# Patient Record
Sex: Female | Born: 1940 | Race: White | Hispanic: No | Marital: Single | Smoking: Former smoker
Health system: Southern US, Community
[De-identification: ages and names within clinical notes are randomized; demographics above are authoritative.]

## PROBLEM LIST (undated history)

## (undated) DIAGNOSIS — G20 Parkinson's disease: Secondary | ICD-10-CM

## (undated) DIAGNOSIS — M1990 Unspecified osteoarthritis, unspecified site: Secondary | ICD-10-CM

## (undated) DIAGNOSIS — G709 Myoneural disorder, unspecified: Secondary | ICD-10-CM

## (undated) DIAGNOSIS — M810 Age-related osteoporosis without current pathological fracture: Secondary | ICD-10-CM

## (undated) DIAGNOSIS — G20A1 Parkinson's disease without dyskinesia, without mention of fluctuations: Secondary | ICD-10-CM

## (undated) DIAGNOSIS — Z9289 Personal history of other medical treatment: Secondary | ICD-10-CM

## (undated) DIAGNOSIS — I10 Essential (primary) hypertension: Secondary | ICD-10-CM

## (undated) DIAGNOSIS — R519 Headache, unspecified: Secondary | ICD-10-CM

## (undated) DIAGNOSIS — Z973 Presence of spectacles and contact lenses: Secondary | ICD-10-CM

## (undated) DIAGNOSIS — R600 Localized edema: Secondary | ICD-10-CM

## (undated) HISTORY — PX: EYE SURGERY: SHX253

## (undated) HISTORY — PX: MOUTH SURGERY: SHX715

## (undated) HISTORY — DX: Myoneural disorder, unspecified: G70.9

---

## 1990-07-25 HISTORY — PX: FOOT TUMOR EXCISION: SUR566

## 1992-07-25 HISTORY — PX: BREAST CYST EXCISION: SHX579

## 1998-04-01 ENCOUNTER — Other Ambulatory Visit: Admission: RE | Admit: 1998-04-01 | Discharge: 1998-04-01 | Payer: Self-pay | Admitting: Obstetrics and Gynecology

## 1998-10-28 ENCOUNTER — Ambulatory Visit (HOSPITAL_COMMUNITY): Admission: RE | Admit: 1998-10-28 | Discharge: 1998-10-28 | Payer: Self-pay | Admitting: Gastroenterology

## 1999-08-03 ENCOUNTER — Ambulatory Visit (HOSPITAL_COMMUNITY): Admission: RE | Admit: 1999-08-03 | Discharge: 1999-08-03 | Payer: Self-pay | Admitting: Obstetrics and Gynecology

## 1999-08-03 ENCOUNTER — Encounter: Payer: Self-pay | Admitting: Obstetrics and Gynecology

## 1999-08-27 ENCOUNTER — Ambulatory Visit (HOSPITAL_COMMUNITY): Admission: RE | Admit: 1999-08-27 | Discharge: 1999-08-27 | Payer: Self-pay | Admitting: Obstetrics and Gynecology

## 1999-08-27 ENCOUNTER — Encounter: Payer: Self-pay | Admitting: Obstetrics and Gynecology

## 2000-02-22 ENCOUNTER — Encounter: Admission: RE | Admit: 2000-02-22 | Discharge: 2000-02-22 | Payer: Self-pay | Admitting: Obstetrics and Gynecology

## 2000-02-22 ENCOUNTER — Encounter: Payer: Self-pay | Admitting: Obstetrics and Gynecology

## 2000-08-23 ENCOUNTER — Encounter: Payer: Self-pay | Admitting: Obstetrics and Gynecology

## 2000-08-23 ENCOUNTER — Encounter: Admission: RE | Admit: 2000-08-23 | Discharge: 2000-08-23 | Payer: Self-pay | Admitting: Obstetrics and Gynecology

## 2000-09-07 ENCOUNTER — Encounter: Payer: Self-pay | Admitting: Internal Medicine

## 2000-09-07 ENCOUNTER — Encounter: Admission: RE | Admit: 2000-09-07 | Discharge: 2000-09-07 | Payer: Self-pay | Admitting: Internal Medicine

## 2001-01-22 ENCOUNTER — Encounter (INDEPENDENT_AMBULATORY_CARE_PROVIDER_SITE_OTHER): Payer: Self-pay | Admitting: *Deleted

## 2001-01-22 ENCOUNTER — Ambulatory Visit (HOSPITAL_BASED_OUTPATIENT_CLINIC_OR_DEPARTMENT_OTHER): Admission: RE | Admit: 2001-01-22 | Discharge: 2001-01-22 | Payer: Self-pay | Admitting: *Deleted

## 2001-08-31 ENCOUNTER — Encounter: Admission: RE | Admit: 2001-08-31 | Discharge: 2001-08-31 | Payer: Self-pay | Admitting: Obstetrics and Gynecology

## 2001-08-31 ENCOUNTER — Encounter: Payer: Self-pay | Admitting: Obstetrics and Gynecology

## 2001-12-27 ENCOUNTER — Encounter (INDEPENDENT_AMBULATORY_CARE_PROVIDER_SITE_OTHER): Payer: Self-pay | Admitting: Specialist

## 2001-12-27 ENCOUNTER — Ambulatory Visit (HOSPITAL_COMMUNITY): Admission: RE | Admit: 2001-12-27 | Discharge: 2001-12-27 | Payer: Self-pay | Admitting: Gastroenterology

## 2002-09-24 ENCOUNTER — Encounter: Admission: RE | Admit: 2002-09-24 | Discharge: 2002-09-24 | Payer: Self-pay | Admitting: Internal Medicine

## 2002-09-24 ENCOUNTER — Encounter: Payer: Self-pay | Admitting: Internal Medicine

## 2002-09-30 ENCOUNTER — Encounter: Admission: RE | Admit: 2002-09-30 | Discharge: 2002-09-30 | Payer: Self-pay | Admitting: Internal Medicine

## 2002-09-30 ENCOUNTER — Encounter: Payer: Self-pay | Admitting: Internal Medicine

## 2003-01-29 ENCOUNTER — Other Ambulatory Visit: Admission: RE | Admit: 2003-01-29 | Discharge: 2003-01-29 | Payer: Self-pay | Admitting: Internal Medicine

## 2003-02-17 ENCOUNTER — Encounter: Payer: Self-pay | Admitting: Internal Medicine

## 2003-02-17 ENCOUNTER — Encounter: Admission: RE | Admit: 2003-02-17 | Discharge: 2003-02-17 | Payer: Self-pay | Admitting: Internal Medicine

## 2003-03-28 ENCOUNTER — Encounter: Payer: Self-pay | Admitting: Internal Medicine

## 2003-03-28 ENCOUNTER — Encounter: Admission: RE | Admit: 2003-03-28 | Discharge: 2003-03-28 | Payer: Self-pay | Admitting: Internal Medicine

## 2003-10-10 ENCOUNTER — Encounter: Admission: RE | Admit: 2003-10-10 | Discharge: 2003-10-10 | Payer: Self-pay | Admitting: Internal Medicine

## 2004-02-02 ENCOUNTER — Other Ambulatory Visit: Admission: RE | Admit: 2004-02-02 | Discharge: 2004-02-02 | Payer: Self-pay | Admitting: Internal Medicine

## 2004-12-14 ENCOUNTER — Encounter: Admission: RE | Admit: 2004-12-14 | Discharge: 2004-12-14 | Payer: Self-pay | Admitting: Internal Medicine

## 2005-02-03 ENCOUNTER — Other Ambulatory Visit: Admission: RE | Admit: 2005-02-03 | Discharge: 2005-02-03 | Payer: Self-pay | Admitting: Internal Medicine

## 2005-03-07 ENCOUNTER — Encounter (INDEPENDENT_AMBULATORY_CARE_PROVIDER_SITE_OTHER): Payer: Self-pay | Admitting: Specialist

## 2005-03-07 ENCOUNTER — Ambulatory Visit (HOSPITAL_COMMUNITY): Admission: RE | Admit: 2005-03-07 | Discharge: 2005-03-07 | Payer: Self-pay | Admitting: Gastroenterology

## 2005-03-29 ENCOUNTER — Encounter: Admission: RE | Admit: 2005-03-29 | Discharge: 2005-03-29 | Payer: Self-pay | Admitting: Internal Medicine

## 2005-07-15 ENCOUNTER — Ambulatory Visit: Admission: RE | Admit: 2005-07-15 | Discharge: 2005-07-15 | Payer: Self-pay | Admitting: Internal Medicine

## 2005-07-25 HISTORY — PX: OTHER SURGICAL HISTORY: SHX169

## 2005-12-30 ENCOUNTER — Encounter: Admission: RE | Admit: 2005-12-30 | Discharge: 2005-12-30 | Payer: Self-pay | Admitting: Internal Medicine

## 2006-02-24 ENCOUNTER — Other Ambulatory Visit: Admission: RE | Admit: 2006-02-24 | Discharge: 2006-02-24 | Payer: Self-pay | Admitting: Internal Medicine

## 2007-01-01 ENCOUNTER — Encounter: Admission: RE | Admit: 2007-01-01 | Discharge: 2007-01-01 | Payer: Self-pay | Admitting: *Deleted

## 2008-01-04 ENCOUNTER — Encounter: Admission: RE | Admit: 2008-01-04 | Discharge: 2008-01-04 | Payer: Self-pay | Admitting: Internal Medicine

## 2009-01-08 ENCOUNTER — Encounter: Admission: RE | Admit: 2009-01-08 | Discharge: 2009-01-08 | Payer: Self-pay | Admitting: Internal Medicine

## 2010-01-11 ENCOUNTER — Encounter: Admission: RE | Admit: 2010-01-11 | Discharge: 2010-01-11 | Payer: Self-pay | Admitting: Internal Medicine

## 2010-12-06 ENCOUNTER — Other Ambulatory Visit: Payer: Self-pay | Admitting: Internal Medicine

## 2010-12-06 DIAGNOSIS — Z1231 Encounter for screening mammogram for malignant neoplasm of breast: Secondary | ICD-10-CM

## 2010-12-10 NOTE — Op Note (Signed)
[place]  Patient:    Hailey [name], Hailey [name]                        MRN: [id number] Proc. Date: 01/22/01 Attending:  Vikki Ports, M.D.                           Operative Report  PREOPERATIVE DIAGNOSIS:  Left breast mass.  POSTOPERATIVE DIAGNOSIS:  Left breast mass.  PROCEDURE:  Excisional left breast biopsy.  SURGEON:  Vikki Ports, M.D.  ANESTHESIA:  Local MAC.  DESCRIPTION OF PROCEDURE:  The patient was taken to the operating room and placed in a supine position.  After adequate anesthesia was induced using MAC technique, the left breast was prepped and draped in the normal sterile fashion.  Using 1% lidocaine local anesthesia, the skin overlying the mass in the 2 oclock region of the left breast was anesthetized.  Dissected down onto a soft, fatty, dominant mass, which was excised in its entirety.  Adequate hemostasis was ensured, and the skin was closed with subcuticular 4-0 Monocryl and Steri-Strips.  Sterile dressing was applied.  The patient tolerated the procedure well and went to PACU in good condition. DD:  01/22/01 TD:  01/22/01 Job: [id number]

## 2010-12-10 NOTE — Op Note (Signed)
[place]  Patient:    Hailey [name], Hailey [name] Visit Number: [id number] MRN: [id number]          Service Type: END Location: ENDO Attending Physician:  Orland Mustard Dictated by:   Llana Aliment. Randa Evens, M.D. Proc. Date: 12/27/01 Admit Date:  12/27/2001   CC:         Darius Bump, M.D.   Operative Report  PROCEDURE:  Colonoscopy and polypectomy.  MEDICATION:  Versed 20 mcg, ______ 10 mg IV.  SCOPE:  Olympus pediatric video colonoscope.  INDICATION:  The patient has had a previous history of adenomatous polyps. This is done as a three year followup.  DESCRIPTION OF PROCEDURE:  The procedure has been explained and the patient consent obtained. The patient in the left lateral decubitus position, the pediatric nonadjustable Olympus scope was inserted and advanced under direct visualization. The patient had long tortuous colon and multiple maneuvers were required. In the lateral decubitus position we were able to reach the cecum. The ileocecal valve and appendiceal orifice were seen. The scope was withdrawn. The cecum, ascending colon, hepatic flexure, transverse colon, splenic flexure, descending and sigmoid colon were seen. In the sigmoid colon, the patient was seen to have moderate diverticulosis. There was a polyp at 35 cm. It was removed. It was 0.5 cm in diameter and on a short stalk and recovered. No other polyps were seen in the sigmoid colon or rectum. The scope was withdrawn, and the patient tolerated the procedure well.  ASSESSMENT:  Sigmoid colon polyp removed. Clinically, recommend repeating procedure in three years, routine postpolypectomy instructions. Dictated by:   Llana Aliment. Randa Evens, M.D. Attending Physician:  Orland Mustard DD:  12/27/01 TD:  12/29/01 Job: [id number]

## 2010-12-10 NOTE — Op Note (Signed)
Hailey Hailey [name], Hailey [name]               ACCOUNT NO.:  [id number]   MEDICAL RECORD NO.:  [id number]          PATIENT TYPE:  AMB   LOCATION:  ENDO                         FACILITY:  [facility]   PHYSICIAN:  James L. Malon Kindle., M.D.DATE OF BIRTH:  11-26-1940   DATE OF PROCEDURE:  03/07/2005  DATE OF DISCHARGE:                                 OPERATIVE REPORT   PROCEDURE:  Colonoscopy and polypectomy.   MEDICATIONS:  Fentanyl 112 mcg, Versed 11mg  IV.   SCOPE:  Olympus pediatric adjustable colonoscope.   INDICATIONS:  Patient has had a previous history of adenomatous polyps.  Mother has had colon cancer.  This is done as a follow-up procedure.   DESCRIPTION OF PROCEDURE:  The procedure has been explained to the patient  and consent obtained.  With the patient in the left lateral decubitus  position, the Olympus pediatric adjustable scope was inserted and advanced.  The prep was excellent.  Using abdominal pressure and position changes, we  were able to finally reach the cecum with the patient in the supine  position.  She had a long, tortuous colon.  Ileocecal valve and appendiceal  orifice seen.  The scope was withdrawn.  The colon carefully examined upon  withdrawal.  No polyps were found until the sigmoid colon 25 cm from the  anal verge, a 0.5 cm sessile polyp was removed with the snare.  I could not  initially recover it and finally ended up grabbing it with the biopsy  forceps and pulling it through the scope.  There was no bleeding at the  site.  No other polyps found throughout the entire colon or the rectum.  Scattered diverticulosis throughout the colon, primarily in the transverse  and more so in the left colon.  The rectum was free of polyps or other  lesions.  The scope was withdrawn.  The patient tolerated the procedure  well.   ASSESSMENT:  1.  Sigmoid colon polyp, removed.  211.3.  2.  Diverticulosis.  562.10.  3.  Strong family history of colon cancer.  V16.0.   PLAN:   Routine post polypectomy instructions.  Will recommend repeating  procedure in five years.           ______________________________  Llana Aliment Malon Kindle., M.D.     Waldron Session  D:  03/07/2005  T:  03/07/2005  Job:  [id number]   cc:   Darius Bump, M.D.  [place] [address] [place]  [place] [address]  Fax: [phone]

## 2011-01-13 ENCOUNTER — Ambulatory Visit
Admission: RE | Admit: 2011-01-13 | Discharge: 2011-01-13 | Disposition: A | Discharge status: Home / Self Care | Payer: Medicare Other | Source: Ambulatory Visit | Attending: Internal Medicine | Admitting: Internal Medicine

## 2011-01-13 DIAGNOSIS — Z1231 Encounter for screening mammogram for malignant neoplasm of breast: Secondary | ICD-10-CM

## 2011-12-27 ENCOUNTER — Other Ambulatory Visit: Payer: Self-pay | Admitting: Internal Medicine

## 2011-12-27 DIAGNOSIS — Z1231 Encounter for screening mammogram for malignant neoplasm of breast: Secondary | ICD-10-CM

## 2012-01-05 ENCOUNTER — Other Ambulatory Visit: Payer: Self-pay | Admitting: Dermatology

## 2012-01-17 ENCOUNTER — Ambulatory Visit
Admission: RE | Admit: 2012-01-17 | Discharge: 2012-01-17 | Disposition: A | Discharge status: Home / Self Care | Payer: Medicare Other | Source: Ambulatory Visit | Attending: Internal Medicine | Admitting: Internal Medicine

## 2012-01-17 DIAGNOSIS — Z1231 Encounter for screening mammogram for malignant neoplasm of breast: Secondary | ICD-10-CM

## 2012-01-19 ENCOUNTER — Ambulatory Visit: Admission: RE | Admit: 2012-01-19 | Payer: Medicare Other | Source: Ambulatory Visit

## 2012-01-19 ENCOUNTER — Ambulatory Visit
Admission: RE | Admit: 2012-01-19 | Discharge: 2012-01-19 | Disposition: A | Discharge status: Home / Self Care | Payer: Medicare Other | Source: Ambulatory Visit | Attending: Internal Medicine | Admitting: Internal Medicine

## 2012-01-19 ENCOUNTER — Other Ambulatory Visit: Payer: Self-pay | Admitting: Internal Medicine

## 2012-01-19 DIAGNOSIS — Z1231 Encounter for screening mammogram for malignant neoplasm of breast: Secondary | ICD-10-CM

## 2012-10-23 ENCOUNTER — Other Ambulatory Visit (HOSPITAL_COMMUNITY): Payer: Self-pay | Admitting: *Deleted

## 2012-10-24 ENCOUNTER — Ambulatory Visit (HOSPITAL_COMMUNITY)
Admission: RE | Admit: 2012-10-24 | Discharge: 2012-10-24 | Disposition: A | Discharge status: Home / Self Care | Payer: Medicare Other | Source: Ambulatory Visit | Attending: Internal Medicine | Admitting: Internal Medicine

## 2012-10-24 ENCOUNTER — Encounter (HOSPITAL_COMMUNITY): Payer: Self-pay

## 2012-10-24 DIAGNOSIS — M810 Age-related osteoporosis without current pathological fracture: Secondary | ICD-10-CM | POA: Insufficient documentation

## 2012-10-24 HISTORY — DX: Age-related osteoporosis without current pathological fracture: M81.0

## 2012-10-24 MED ORDER — ZOLEDRONIC ACID 5 MG/100ML IV SOLN
5.0000 mg | Freq: Once | INTRAVENOUS | Status: AC
  Administered 2012-10-24: 5 mg via INTRAVENOUS
  Filled 2012-10-24: qty 100

## 2012-10-24 MED ORDER — SODIUM CHLORIDE 0.9 % IV SOLN
Freq: Once | INTRAVENOUS | Status: AC
  Administered 2012-10-24: 16:00:00 via INTRAVENOUS

## 2012-12-20 ENCOUNTER — Other Ambulatory Visit: Payer: Self-pay

## 2012-12-20 DIAGNOSIS — Z1231 Encounter for screening mammogram for malignant neoplasm of breast: Secondary | ICD-10-CM

## 2013-01-14 ENCOUNTER — Other Ambulatory Visit: Payer: Self-pay | Admitting: Dermatology

## 2013-01-22 ENCOUNTER — Ambulatory Visit
Admission: RE | Admit: 2013-01-22 | Discharge: 2013-01-22 | Disposition: A | Discharge status: Home / Self Care | Payer: Medicare Other | Source: Ambulatory Visit

## 2013-01-22 DIAGNOSIS — Z1231 Encounter for screening mammogram for malignant neoplasm of breast: Secondary | ICD-10-CM

## 2013-12-13 ENCOUNTER — Encounter (HOSPITAL_COMMUNITY): Payer: Self-pay

## 2013-12-17 ENCOUNTER — Other Ambulatory Visit: Payer: Self-pay

## 2013-12-17 DIAGNOSIS — Z1231 Encounter for screening mammogram for malignant neoplasm of breast: Secondary | ICD-10-CM

## 2013-12-19 NOTE — Pre-Procedure Instructions (Signed)
Hailey [name]  12/19/2013   Your procedure is scheduled on: Thursday, December 26, 2013  Report to [facility] (use Main Entrance "A'') at 5:30 AM.  Call this number if you have problems the morning of surgery: [phone]   Remember:   Do not eat food or drink liquids after midnight. On WEDNESDAY   Take these medicines the morning of surgery with A SIP OF WATER: NONE Stop taking Aspirin, vitamins and herbal medications. Do not take any NSAIDs ie: Ibuprofen, Advil, Naproxen or any medication containing Aspirin ; stop 5 days prior to procedure.  Do not wear jewelry, make-up or nail polish.   Do not wear lotions, powders, or perfumes. You may  NOT wear deodorant.   Do not shave 48 hours prior to surgery.   Do not bring valuables to the hospital.  [facility] is not responsible for any belongings or valuables.               Contacts, dentures or bridgework may not be worn into surgery.    Leave suitcase in the car. After surgery it may be brought to your room.   For patients admitted to the hospital, discharge time is determined by your treatment team.               Patients discharged the day of surgery will not be allowed to drive home.  Name and phone number of your driver:   Special Instructions:  Special Instructions:Special Instructions: [facility] - Preparing for Surgery  Before surgery, you can play an important role.  Because skin is not sterile, your skin needs to be as free of germs as possible.  You can reduce the number of germs on you skin by washing with CHG (chlorahexidine gluconate) soap before surgery.  CHG is an antiseptic cleaner which kills germs and bonds with the skin to continue killing germs even after washing.  Please DO NOT use if you have an allergy to CHG or antibacterial soaps.  If your skin becomes reddened/irritated stop using the CHG and inform your nurse when you arrive at Short Stay.  Do not shave (including legs and underarms) for at least 48  hours prior to the first CHG shower.  You may shave your face.  Please follow these instructions carefully:   1.  Shower with CHG Soap the night before surgery and the morning of Surgery.  2.  If you choose to wash your hair, wash your hair first as usual with your normal shampoo.  3.  After you shampoo, rinse your hair and body thoroughly to remove the Shampoo.  4.  Use CHG as you would any other liquid soap.  You can apply chg directly  to the skin and wash gently with scrungie or a clean washcloth.  5.  Apply the CHG Soap to your body ONLY FROM THE NECK DOWN.  Do not use on open wounds or open sores.  Avoid contact with your eyes, ears, mouth and genitals (private parts).  Wash genitals (private parts) with your normal soap.  6.  Wash thoroughly, paying special attention to the area where your surgery will be performed.  7.  Thoroughly rinse your body with warm water from the neck down.  8.  DO NOT shower/wash with your normal soap after using and rinsing off the CHG Soap.  9.  Pat yourself dry with a clean towel.            10.  Wear clean pajamas.  11.  Place clean sheets on your bed the night of your first shower and do not sleep with pets.  Day of Surgery  Do not apply any lotions/deodorants the morning of surgery.  Please wear clean clothes to the hospital/surgery center.   Please read over the following fact sheets that you were given: Pain Booklet, Coughing and Deep Breathing, Blood Transfusion Information and Surgical Site Infection Prevention

## 2013-12-20 ENCOUNTER — Encounter (HOSPITAL_COMMUNITY): Payer: Self-pay

## 2013-12-20 ENCOUNTER — Encounter (HOSPITAL_COMMUNITY)
Admission: RE | Admit: 2013-12-20 | Discharge: 2013-12-20 | Disposition: A | Discharge status: Home / Self Care | Payer: Medicare Other | Source: Ambulatory Visit | Attending: Orthopedic Surgery | Admitting: Orthopedic Surgery

## 2013-12-20 DIAGNOSIS — Z01812 Encounter for preprocedural laboratory examination: Secondary | ICD-10-CM | POA: Insufficient documentation

## 2013-12-20 HISTORY — DX: Unspecified osteoarthritis, unspecified site: M19.90

## 2013-12-20 HISTORY — DX: Personal history of other medical treatment: Z92.89

## 2013-12-20 LAB — COMPREHENSIVE METABOLIC PANEL
ALBUMIN: 3.7 g/dL (ref 3.5–5.2)
ALK PHOS: 58 U/L (ref 39–117)
ALT: 10 U/L (ref 0–35)
AST: 15 U/L (ref 0–37)
BUN: 20 mg/dL (ref 6–23)
CALCIUM: 9.5 mg/dL (ref 8.4–10.5)
CO2: 27 mEq/L (ref 19–32)
Chloride: 104 mEq/L (ref 96–112)
Creatinine, Ser: 0.89 mg/dL (ref 0.50–1.10)
GFR calc Af Amer: 73 mL/min — ABNORMAL LOW (ref >90)
GFR calc non Af Amer: 63 mL/min — ABNORMAL LOW (ref >90)
Glucose, Bld: 84 mg/dL (ref 70–99)
POTASSIUM: 4.1 meq/L (ref 3.7–5.3)
SODIUM: 143 meq/L (ref 137–147)
TOTAL PROTEIN: 6.6 g/dL (ref 6.0–8.3)
Total Bilirubin: 0.4 mg/dL (ref 0.3–1.2)

## 2013-12-20 LAB — CBC WITH DIFFERENTIAL/PLATELET
BASOS ABS: 0 10*3/uL (ref 0.0–0.1)
BASOS PCT: 0 % (ref 0–1)
EOS ABS: 0.1 10*3/uL (ref 0.0–0.7)
EOS PCT: 3 % (ref 0–5)
HCT: 39.8 % (ref 36.0–46.0)
Hemoglobin: 13.3 g/dL (ref 12.0–15.0)
Lymphocytes Relative: 22 % (ref 12–46)
Lymphs Abs: 0.9 10*3/uL (ref 0.7–4.0)
MCH: 30.5 pg (ref 26.0–34.0)
MCHC: 33.4 g/dL (ref 30.0–36.0)
MCV: 91.3 fL (ref 78.0–100.0)
Monocytes Absolute: 0.4 10*3/uL (ref 0.1–1.0)
Monocytes Relative: 10 % (ref 3–12)
NEUTROS PCT: 65 % (ref 43–77)
Neutro Abs: 2.7 10*3/uL (ref 1.7–7.7)
Platelets: 254 10*3/uL (ref 150–400)
RBC: 4.36 MIL/uL (ref 3.87–5.11)
RDW: 14.1 % (ref 11.5–15.5)
WBC: 4.2 10*3/uL (ref 4.0–10.5)

## 2013-12-20 LAB — APTT: aPTT: 32 seconds (ref 24–37)

## 2013-12-20 LAB — PROTIME-INR
INR: 1.08 (ref 0.00–1.49)
Prothrombin Time: 13.8 seconds (ref 11.6–15.2)

## 2013-12-20 LAB — TYPE AND SCREEN
ABO/RH(D): A POS
ANTIBODY SCREEN: NEGATIVE

## 2013-12-20 LAB — ABO/RH: ABO/RH(D): A POS

## 2013-12-25 MED ORDER — LACTATED RINGERS IV SOLN
INTRAVENOUS | Status: DC
  Administered 2013-12-26: 07:00:00 via INTRAVENOUS

## 2013-12-25 MED ORDER — CHLORHEXIDINE GLUCONATE 4 % EX LIQD
60.0000 mL | Freq: Once | CUTANEOUS | Status: DC
  Filled 2013-12-25: qty 60

## 2013-12-25 MED ORDER — CEFAZOLIN SODIUM-DEXTROSE 2-3 GM-% IV SOLR
2.0000 g | INTRAVENOUS | Status: AC
  Administered 2013-12-26: 2 g via INTRAVENOUS
  Filled 2013-12-25: qty 50

## 2013-12-26 ENCOUNTER — Inpatient Hospital Stay (HOSPITAL_COMMUNITY): Payer: Medicare Other | Admitting: Anesthesiology

## 2013-12-26 ENCOUNTER — Encounter (HOSPITAL_COMMUNITY): Payer: Medicare Other | Admitting: Anesthesiology

## 2013-12-26 ENCOUNTER — Encounter (HOSPITAL_COMMUNITY)
Admission: RE | Disposition: A | Discharge status: Home Health | Payer: Self-pay | Source: Ambulatory Visit | Attending: Orthopedic Surgery

## 2013-12-26 ENCOUNTER — Encounter (HOSPITAL_COMMUNITY): Payer: Self-pay | Admitting: *Deleted

## 2013-12-26 ENCOUNTER — Inpatient Hospital Stay (HOSPITAL_COMMUNITY)
Admission: RE | Admit: 2013-12-26 | Discharge: 2013-12-27 | DRG: 483 | Disposition: A | Discharge status: Home Health | Payer: Medicare Other | Source: Ambulatory Visit | Attending: Orthopedic Surgery | Admitting: Orthopedic Surgery

## 2013-12-26 DIAGNOSIS — Z7982 Long term (current) use of aspirin: Secondary | ICD-10-CM

## 2013-12-26 DIAGNOSIS — Z87891 Personal history of nicotine dependence: Secondary | ICD-10-CM

## 2013-12-26 DIAGNOSIS — Z79899 Other long term (current) drug therapy: Secondary | ICD-10-CM

## 2013-12-26 DIAGNOSIS — Z96619 Presence of unspecified artificial shoulder joint: Secondary | ICD-10-CM

## 2013-12-26 DIAGNOSIS — M19019 Primary osteoarthritis, unspecified shoulder: Principal | ICD-10-CM | POA: Diagnosis present

## 2013-12-26 DIAGNOSIS — M810 Age-related osteoporosis without current pathological fracture: Secondary | ICD-10-CM | POA: Diagnosis present

## 2013-12-26 HISTORY — PX: REVERSE SHOULDER ARTHROPLASTY: SHX5054

## 2013-12-26 SURGERY — ARTHROPLASTY, SHOULDER, TOTAL, REVERSE
Anesthesia: Regional | Site: Shoulder | Laterality: Right

## 2013-12-26 MED ORDER — DIAZEPAM 5 MG PO TABS: 2.5000 mg | ORAL_TABLET | Freq: Four times a day (QID) | ORAL | Status: DC | PRN

## 2013-12-26 MED ORDER — ROCURONIUM BROMIDE 50 MG/5ML IV SOLN
INTRAVENOUS | Status: AC
  Filled 2013-12-26: qty 1

## 2013-12-26 MED ORDER — SODIUM CHLORIDE 0.9 % IV SOLN
10.0000 mg | INTRAVENOUS | Status: DC | PRN
  Administered 2013-12-26: 25 ug/min via INTRAVENOUS

## 2013-12-26 MED ORDER — SODIUM CHLORIDE 0.9 % IR SOLN
Status: DC | PRN
  Administered 2013-12-26: 1

## 2013-12-26 MED ORDER — PHENOL 1.4 % MT LIQD: 1.0000 | OROMUCOSAL | Status: DC | PRN

## 2013-12-26 MED ORDER — GLYCOPYRROLATE 0.2 MG/ML IJ SOLN
INTRAMUSCULAR | Status: AC
  Filled 2013-12-26: qty 1

## 2013-12-26 MED ORDER — NEOSTIGMINE METHYLSULFATE 10 MG/10ML IV SOLN
INTRAVENOUS | Status: AC
  Filled 2013-12-26: qty 1

## 2013-12-26 MED ORDER — MIDAZOLAM HCL 2 MG/2ML IJ SOLN
INTRAMUSCULAR | Status: AC
  Filled 2013-12-26: qty 2

## 2013-12-26 MED ORDER — ACETAMINOPHEN 650 MG RE SUPP: 650.0000 mg | Freq: Four times a day (QID) | RECTAL | Status: DC | PRN

## 2013-12-26 MED ORDER — DOCUSATE SODIUM 100 MG PO CAPS
100.0000 mg | ORAL_CAPSULE | Freq: Two times a day (BID) | ORAL | Status: DC
  Administered 2013-12-26 – 2013-12-27 (×2): 100 mg via ORAL
  Filled 2013-12-26 (×3): qty 1

## 2013-12-26 MED ORDER — NEOSTIGMINE METHYLSULFATE 10 MG/10ML IV SOLN
INTRAVENOUS | Status: DC | PRN
  Administered 2013-12-26: 3 mg via INTRAVENOUS

## 2013-12-26 MED ORDER — PROPOFOL 10 MG/ML IV BOLUS
INTRAVENOUS | Status: DC | PRN
  Administered 2013-12-26: 110 mg via INTRAVENOUS

## 2013-12-26 MED ORDER — GLYCOPYRROLATE 0.2 MG/ML IJ SOLN
INTRAMUSCULAR | Status: DC | PRN
  Administered 2013-12-26: 0.2 mg via INTRAVENOUS
  Administered 2013-12-26: 0.4 mg via INTRAVENOUS

## 2013-12-26 MED ORDER — FENTANYL CITRATE 0.05 MG/ML IJ SOLN: 25.0000 ug | INTRAMUSCULAR | Status: DC | PRN

## 2013-12-26 MED ORDER — SODIUM CHLORIDE 0.9 % IJ SOLN
INTRAMUSCULAR | Status: AC
  Filled 2013-12-26: qty 10

## 2013-12-26 MED ORDER — EPHEDRINE SULFATE 50 MG/ML IJ SOLN
INTRAMUSCULAR | Status: AC
  Filled 2013-12-26: qty 1

## 2013-12-26 MED ORDER — CEFAZOLIN SODIUM 1-5 GM-% IV SOLN
1.0000 g | Freq: Four times a day (QID) | INTRAVENOUS | Status: AC
  Administered 2013-12-26 – 2013-12-27 (×3): 1 g via INTRAVENOUS
  Filled 2013-12-26 (×4): qty 50

## 2013-12-26 MED ORDER — GLYCOPYRROLATE 0.2 MG/ML IJ SOLN
INTRAMUSCULAR | Status: AC
  Filled 2013-12-26: qty 2

## 2013-12-26 MED ORDER — BUPIVACAINE-EPINEPHRINE (PF) 0.5% -1:200000 IJ SOLN
INTRAMUSCULAR | Status: DC | PRN
  Administered 2013-12-26: 30 mL

## 2013-12-26 MED ORDER — ONDANSETRON HCL 4 MG/2ML IJ SOLN: 4.0000 mg | Freq: Four times a day (QID) | INTRAMUSCULAR | Status: DC | PRN

## 2013-12-26 MED ORDER — ONDANSETRON HCL 4 MG/2ML IJ SOLN
INTRAMUSCULAR | Status: DC | PRN
  Administered 2013-12-26: 4 mg via INTRAVENOUS

## 2013-12-26 MED ORDER — FENTANYL CITRATE 0.05 MG/ML IJ SOLN
INTRAMUSCULAR | Status: AC
  Filled 2013-12-26: qty 5

## 2013-12-26 MED ORDER — ONDANSETRON HCL 4 MG/2ML IJ SOLN
INTRAMUSCULAR | Status: AC
  Filled 2013-12-26: qty 2

## 2013-12-26 MED ORDER — OXYCODONE-ACETAMINOPHEN 5-325 MG PO TABS
1.0000 | ORAL_TABLET | ORAL | Status: DC | PRN
  Administered 2013-12-27 (×3): 2 via ORAL
  Filled 2013-12-26 (×3): qty 2

## 2013-12-26 MED ORDER — LIDOCAINE HCL (CARDIAC) 20 MG/ML IV SOLN
INTRAVENOUS | Status: DC | PRN
  Administered 2013-12-26: 40 mg via INTRAVENOUS

## 2013-12-26 MED ORDER — MENTHOL 3 MG MT LOZG
1.0000 | LOZENGE | OROMUCOSAL | Status: DC | PRN
Start: 2013-12-26 — End: 2013-12-27

## 2013-12-26 MED ORDER — DIPHENHYDRAMINE HCL 12.5 MG/5ML PO ELIX: 12.5000 mg | ORAL_SOLUTION | ORAL | Status: DC | PRN

## 2013-12-26 MED ORDER — OXYCODONE HCL 5 MG/5ML PO SOLN: 5.0000 mg | Freq: Once | ORAL | Status: DC | PRN

## 2013-12-26 MED ORDER — LIDOCAINE HCL (CARDIAC) 20 MG/ML IV SOLN
INTRAVENOUS | Status: AC
  Filled 2013-12-26: qty 5

## 2013-12-26 MED ORDER — LACTATED RINGERS IV SOLN
INTRAVENOUS | Status: DC
  Administered 2013-12-26: 75 mL/h via INTRAVENOUS

## 2013-12-26 MED ORDER — HYDROMORPHONE HCL PF 1 MG/ML IJ SOLN: 0.2500 mg | INTRAMUSCULAR | Status: DC | PRN

## 2013-12-26 MED ORDER — FENTANYL CITRATE 0.05 MG/ML IJ SOLN
INTRAMUSCULAR | Status: DC | PRN
  Administered 2013-12-26: 50 ug via INTRAVENOUS

## 2013-12-26 MED ORDER — OXYCODONE HCL 5 MG PO TABS: 5.0000 mg | ORAL_TABLET | Freq: Once | ORAL | Status: DC | PRN

## 2013-12-26 MED ORDER — METOCLOPRAMIDE HCL 5 MG/ML IJ SOLN: 5.0000 mg | Freq: Three times a day (TID) | INTRAMUSCULAR | Status: DC | PRN

## 2013-12-26 MED ORDER — ROCURONIUM BROMIDE 100 MG/10ML IV SOLN
INTRAVENOUS | Status: DC | PRN
  Administered 2013-12-26: 50 mg via INTRAVENOUS

## 2013-12-26 MED ORDER — KETOROLAC TROMETHAMINE 15 MG/ML IJ SOLN
15.0000 mg | Freq: Four times a day (QID) | INTRAMUSCULAR | Status: DC
  Administered 2013-12-26 – 2013-12-27 (×4): 15 mg via INTRAVENOUS
  Filled 2013-12-26 (×8): qty 1

## 2013-12-26 MED ORDER — METOCLOPRAMIDE HCL 5 MG PO TABS
5.0000 mg | ORAL_TABLET | Freq: Three times a day (TID) | ORAL | Status: DC | PRN
  Filled 2013-12-26: qty 2

## 2013-12-26 MED ORDER — PROPOFOL 10 MG/ML IV BOLUS
INTRAVENOUS | Status: AC
  Filled 2013-12-26: qty 20

## 2013-12-26 MED ORDER — ONDANSETRON HCL 4 MG PO TABS: 4.0000 mg | ORAL_TABLET | Freq: Four times a day (QID) | ORAL | Status: DC | PRN

## 2013-12-26 MED ORDER — ACETAMINOPHEN 325 MG PO TABS: 650.0000 mg | ORAL_TABLET | Freq: Four times a day (QID) | ORAL | Status: DC | PRN

## 2013-12-26 MED ORDER — ALUM & MAG HYDROXIDE-SIMETH 200-200-20 MG/5ML PO SUSP: 30.0000 mL | ORAL | Status: DC | PRN

## 2013-12-26 SURGICAL SUPPLY — 73 items
BIT DRILL 170X2.5X (BIT) IMPLANT
BIT DRL 170X2.5X (BIT)
BLADE SAW SGTL 83.5X18.5 (BLADE) ×3 IMPLANT
BRUSH FEMORAL CANAL (MISCELLANEOUS) IMPLANT
CAPT SHOULD DELTAXTEND CEM MOD ×3 IMPLANT
CEMENT BONE DEPUY (Cement) ×6 IMPLANT
CEMENT RESTRICTOR DEPUY SZ 3 (Cement) ×3 IMPLANT
CLOSURE WOUND 1/2 X4 (GAUZE/BANDAGES/DRESSINGS) ×1
COVER SURGICAL LIGHT HANDLE (MISCELLANEOUS) ×3 IMPLANT
DERMABOND ADVANCED (GAUZE/BANDAGES/DRESSINGS) ×2
DERMABOND ADVANCED .7 DNX12 (GAUZE/BANDAGES/DRESSINGS) ×1 IMPLANT
DRAPE INCISE IOBAN 66X45 STRL (DRAPES) ×3 IMPLANT
DRAPE SURG 17X11 SM STRL (DRAPES) ×3 IMPLANT
DRAPE U-SHAPE 47X51 STRL (DRAPES) ×3 IMPLANT
DRILL 2.5 (BIT)
DRILL BIT 7/64X5 (BIT) ×3 IMPLANT
DRSG AQUACEL AG ADV 3.5X10 (GAUZE/BANDAGES/DRESSINGS) ×3 IMPLANT
DRSG MEPILEX BORDER 4X8 (GAUZE/BANDAGES/DRESSINGS) IMPLANT
DURAPREP 26ML APPLICATOR (WOUND CARE) ×6 IMPLANT
ELECT BLADE 4.0 EZ CLEAN MEGAD (MISCELLANEOUS) ×3
ELECT CAUTERY BLADE 6.4 (BLADE) ×3 IMPLANT
ELECT REM PT RETURN 9FT ADLT (ELECTROSURGICAL) ×3
ELECTRODE BLDE 4.0 EZ CLN MEGD (MISCELLANEOUS) ×1 IMPLANT
ELECTRODE REM PT RTRN 9FT ADLT (ELECTROSURGICAL) ×1 IMPLANT
FACESHIELD WRAPAROUND (MASK) ×9 IMPLANT
GLOVE BIO SURGEON STRL SZ7.5 (GLOVE) ×3 IMPLANT
GLOVE BIO SURGEON STRL SZ8 (GLOVE) ×3 IMPLANT
GLOVE EUDERMIC 7 POWDERFREE (GLOVE) ×3 IMPLANT
GLOVE SS BIOGEL STRL SZ 7.5 (GLOVE) ×1 IMPLANT
GLOVE SUPERSENSE BIOGEL SZ 7.5 (GLOVE) ×2
GOWN STRL REUS W/ TWL LRG LVL3 (GOWN DISPOSABLE) IMPLANT
GOWN STRL REUS W/ TWL XL LVL3 (GOWN DISPOSABLE) ×2 IMPLANT
GOWN STRL REUS W/TWL LRG LVL3 (GOWN DISPOSABLE)
GOWN STRL REUS W/TWL XL LVL3 (GOWN DISPOSABLE) ×4
HANDPIECE INTERPULSE COAX TIP (DISPOSABLE) ×2
KIT BASIN OR (CUSTOM PROCEDURE TRAY) ×3 IMPLANT
KIT ROOM TURNOVER OR (KITS) ×3 IMPLANT
MANIFOLD NEPTUNE II (INSTRUMENTS) ×3 IMPLANT
NDL SUT 6 .5 CRC .975X.05 MAYO (NEEDLE) IMPLANT
NEEDLE HYPO 25GX1X1/2 BEV (NEEDLE) IMPLANT
NEEDLE MAYO TAPER (NEEDLE)
NS IRRIG 1000ML POUR BTL (IV SOLUTION) ×3 IMPLANT
PACK SHOULDER (CUSTOM PROCEDURE TRAY) ×3 IMPLANT
PAD ARMBOARD 7.5X6 YLW CONV (MISCELLANEOUS) ×6 IMPLANT
PASSER SUT SWANSON 36MM LOOP (INSTRUMENTS) IMPLANT
PIN GUIDE 1.2 (PIN) ×3 IMPLANT
PIN GUIDE GLENOPHERE 1.5MX300M (PIN) IMPLANT
PIN METAGLENE 2.5 (PIN) IMPLANT
PRESSURIZER FEMORAL UNIV (MISCELLANEOUS) IMPLANT
SET HNDPC FAN SPRY TIP SCT (DISPOSABLE) ×1 IMPLANT
SLING ARM LRG ADULT FOAM STRAP (SOFTGOODS) IMPLANT
SLING ARM XL FOAM STRAP (SOFTGOODS) ×3 IMPLANT
SPONGE LAP 18X18 X RAY DECT (DISPOSABLE) ×6 IMPLANT
SPONGE LAP 4X18 X RAY DECT (DISPOSABLE) IMPLANT
STRIP CLOSURE SKIN 1/2X4 (GAUZE/BANDAGES/DRESSINGS) ×2 IMPLANT
SUCTION FRAZIER TIP 10 FR DISP (SUCTIONS) ×3 IMPLANT
SUT BONE WAX W31G (SUTURE) IMPLANT
SUT FIBERWIRE #2 38 T-5 BLUE (SUTURE) ×6
SUT MNCRL AB 3-0 PS2 18 (SUTURE) ×3 IMPLANT
SUT VIC AB 1 CT1 27 (SUTURE) ×2
SUT VIC AB 1 CT1 27XBRD ANBCTR (SUTURE) ×1 IMPLANT
SUT VIC AB 2-0 CT1 27 (SUTURE) ×2
SUT VIC AB 2-0 CT1 TAPERPNT 27 (SUTURE) ×1 IMPLANT
SUT VIC AB 2-0 SH 27 (SUTURE)
SUT VIC AB 2-0 SH 27X BRD (SUTURE) IMPLANT
SUTURE FIBERWR #2 38 T-5 BLUE (SUTURE) ×2 IMPLANT
SYR 30ML SLIP (SYRINGE) ×3 IMPLANT
SYR CONTROL 10ML LL (SYRINGE) IMPLANT
TOWEL OR 17X24 6PK STRL BLUE (TOWEL DISPOSABLE) ×3 IMPLANT
TOWEL OR 17X26 10 PK STRL BLUE (TOWEL DISPOSABLE) ×3 IMPLANT
TOWER CARTRIDGE SMART MIX (DISPOSABLE) ×3 IMPLANT
TRAY FOLEY CATH 16FRSI W/METER (SET/KITS/TRAYS/PACK) IMPLANT
WATER STERILE IRR 1000ML POUR (IV SOLUTION) ×3 IMPLANT

## 2013-12-26 NOTE — Op Note (Signed)
12/26/2013  9:34 AM  PATIENT:   Hailey [name]  73 y.o. female  PRE-OPERATIVE DIAGNOSIS:  RIGHT SHOULDER ROTATOR CUFF TEAR ARTHOPATHY   POST-OPERATIVE DIAGNOSIS:  same  PROCEDURE:  Right reverse shoulder arthroplasty 12 cemented stem, +3 poly, 38 eccentric glenosphere  SURGEON:  Ti[place] Emmitt, Metta Clines M.D.  ASSISTANTS: Shuford pac   ANESTHESIA:   GET + ISB  EBL: 200  SPECIMEN:  none  Drains: none   PATIENT DISPOSITION:  PACU - hemodynamically stable.    PLAN OF CARE: Admit to inpatient   Dictation# [id number]

## 2013-12-26 NOTE — Anesthesia Preprocedure Evaluation (Addendum)
Anesthesia Evaluation  Patient identified by MRN, date of birth, ID band Patient awake    Reviewed: Allergy & Precautions, H&P , NPO status , Patient's Chart, lab work & pertinent test results  Airway Mallampati: I  Neck ROM: full    Dental  (+) Teeth Intact   Pulmonary former smoker,          Cardiovascular negative cardio ROS      Neuro/Psych    GI/Hepatic   Endo/Other    Renal/GU      Musculoskeletal  (+) Arthritis -,   Abdominal   Peds  Hematology   Anesthesia Other Findings   Reproductive/Obstetrics                          Anesthesia Physical Anesthesia Plan  ASA: II  Anesthesia Plan: General and Regional   Post-op Pain Management: MAC Combined w/ Regional for Post-op pain   Induction: Intravenous  Airway Management Planned: Oral ETT  Additional Equipment:   Intra-op Plan:   Post-operative Plan: Extubation in OR  Informed Consent: I have reviewed the patients History and Physical, chart, labs and discussed the procedure including the risks, benefits and alternatives for the proposed anesthesia with the patient or authorized representative who has indicated his/her understanding and acceptance.     Plan Discussed with: CRNA, Anesthesiologist and Surgeon  Anesthesia Plan Comments:         Anesthesia Quick Evaluation

## 2013-12-26 NOTE — Progress Notes (Signed)
Utilization review completed.  

## 2013-12-26 NOTE — Anesthesia Procedure Notes (Addendum)
Procedure Name: Intubation Date/Time: 12/26/2013 7:43 AM Performed by: Kyung Rudd Pre-anesthesia Checklist: Patient identified, Emergency Drugs available, Suction available, Patient being monitored and Timeout performed Patient Re-evaluated:Patient Re-evaluated prior to inductionOxygen Delivery Method: Circle system utilized Preoxygenation: Pre-oxygenation with 100% oxygen Intubation Type: IV induction Ventilation: Mask ventilation without difficulty Laryngoscope Size: Mac and 3 Grade View: Grade I Tube type: Oral Tube size: 7.0 mm Number of attempts: 1 Airway Equipment and Method: Stylet Placement Confirmation: ETT inserted through vocal cords under direct vision,  positive ETCO2 and breath sounds checked- equal and bilateral Secured at: 21 cm Tube secured with: Tape Dental Injury: Teeth and Oropharynx as per pre-operative assessment    Anesthesia Regional Block:  Interscalene brachial plexus block  Pre-Anesthetic Checklist: ,, timeout performed, Correct Patient, Correct Site, Correct Laterality, Correct Procedure, Correct Position, site marked, Risks and benefits discussed,  Surgical consent,  Pre-op evaluation,  At surgeon's request and post-op pain management  Laterality: Right  Prep: chloraprep       Needles:  Injection technique: Single-shot  Needle Type: Echogenic Stimulator Needle     Needle Length: 5cm 5 cm Needle Gauge: 22 and 22 G    Additional Needles:  Procedures: ultrasound guided (picture in chart) and nerve stimulator Interscalene brachial plexus block  Nerve Stimulator or Paresthesia:  Response: biceps flexion, 0.45 mA,   Additional Responses:   Narrative:  Start time: 12/26/2013 7:09 AM End time: 12/26/2013 7:18 AM Injection made incrementally with aspirations every 5 mL.  Performed by: Personally  Anesthesiologist: Dr Marcie Bal  Additional Notes: Functioning IV was confirmed and monitors were applied.  A 57mm 22ga Arrow echogenic stimulator  needle was used. Sterile prep and drape,hand hygiene and sterile gloves were used.  Negative aspiration and negative test dose prior to incremental administration of local anesthetic. The patient tolerated the procedure well.  Ultrasound guidance: relevent anatomy identified, needle position confirmed, local anesthetic spread visualized around nerve(s), vascular puncture avoided.  Image printed for medical record.

## 2013-12-26 NOTE — H&P (Signed)
[facility]    Chief Complaint: RIGHT SHOULDER ROTATOR CUFF TEAR ARTHOPATHY  HPI: The patient is a 73 y.o. 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 Osteoporosis   . H/O exercise stress test     4-5 yrs. ago, told that it was wnl , done at [place] office   . 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 Cancer     pre-ca, L side of face     Past Surgical History  Procedure Laterality Date  . Foot tumor excision Left 1992    benign  . Breast cyst excision Left 1994    benign  . Cataract surgery Bilateral 2007  . Eye surgery      /w IOL    History reviewed. No pertinent family history.  Social History:  reports that she quit smoking about 45 years ago. She has quit using smokeless tobacco. She reports that she does not drink alcohol or use illicit drugs.  Allergies:  Allergies  Allergen Reactions  . Forteo [Teriparatide (Recombinant)] Other (See Comments)    " strange feeling and I cannot describe how I felt but the Dr took me off of it"  . Other Rash    Tincture of methiolate caused blistering    Medications Prior to Admission  Medication Sig Dispense Refill  . aspirin 81 MG tablet Take 81 mg by mouth daily.      . Calcium Carbonate-Vitamin D (CALTRATE 600+D) 600-400 MG-UNIT per tablet Take 1 tablet by mouth daily.      . ergocalciferol (VITAMIN D2) 50000 UNITS capsule Take 50,000 Units by mouth 2 (two) times a week. Friday & tuesdays      . ibuprofen (ADVIL,MOTRIN) 200 MG tablet Take 200 mg by mouth every other day.         Physical Exam: right shoulder with painful and restricted moti9on as noted at recent office visits  Vitals  Temp:  [98.5 F (36.9 C)] 98.5 F (36.9 C) (06/04 0544) Pulse Rate:  [73] 73 (06/04 0544) Resp:  [16] 16 (06/04 0544) BP: (169)/(70) 169/70 mmHg (06/04 0544) SpO2:  [97 %] 97 % (06/04 0544)  Assessment/Plan  Impression: RIGHT SHOULDER ROTATOR CUFF TEAR ARTHOPATHY    Plan of Action: Procedure(s): RIGHT SHOULDER REVERSE ARTHROPLASTY  Metta Clines Jacobus Colvin 12/26/2013, 7:32 AM

## 2013-12-26 NOTE — Transfer of Care (Signed)
Immediate Anesthesia Transfer of Care Note  Patient: Hailey [name]  Procedure(s) Performed: Procedure(s): RIGHT SHOULDER REVERSE ARTHROPLASTY (Right)  Patient Location: PACU  Anesthesia Type:General  Level of Consciousness: sedated  Airway & Oxygen Therapy: Patient Spontanous Breathing and Patient connected to nasal cannula oxygen  Post-op Assessment: Report given to PACU RN and Post -op Vital signs reviewed and stable  Post vital signs: Reviewed and stable  Complications: No apparent anesthesia complications

## 2013-12-26 NOTE — Evaluation (Signed)
Physical Therapy Evaluation Patient Details Name: Hailey [name] MRN: [id number] DOB: 1941-05-12 Today's Date: 12/26/2013   History of Present Illness  73 y.o. female admitted to [facility] on 12/26/13 for elective R reverse TSA.  She has significant PMHx of bil knee OA (being followed by Dr. Elmyra Ricks).    Clinical Impression  Pt is POD #0 s/p R reverse TSA.  She is doing well, but mildly unsteady on her feet.  I fear she will do too well to qualify for home therapy services.  OT to provide HEP and input on d/c dispo likely tomorrow.  Physician will likely not order OP PT f/u until after her follow up appointment with him.  PT will f/u tomorrow to make sure pt looks more steady on her feet and to practice curb step.   PT to follow acutely for deficits listed below.       Follow Up Recommendations Outpatient PT;Supervision for mobility/OOB (pt will likely progress to be too good for HHPT)    Equipment Recommendations  None recommended by PT    Recommendations for Other Services   NA    Precautions / Restrictions Precautions Precautions: Shoulder Shoulder Interventions: Shoulder sling/immobilizer Restrictions RUE Weight Bearing:  (no orders for NWB)      Mobility             Transfers Overall transfer level: Needs assistance Equipment used: None Transfers: Sit to/from Stand Sit to Stand: Min assist         General transfer comment: min assist to support trunk during transition.  Verbal cues for left upper extremity on chair rail.   Ambulation/Gait Ambulation/Gait assistance: Min guard Ambulation Distance (Feet): 150 Feet Assistive device: 1 person hand held assist Gait Pattern/deviations: Step-through pattern;Shuffle;Staggering left;Staggering right Gait velocity: decreased Gait velocity interpretation: <1.8 ft/sec, indicative of risk for recurrent falls General Gait Details: mildly staggering gait pattern, slow and guarded.  Gait pattern and confidence improved with increased  distance.            Balance Overall balance assessment: Needs assistance         Standing balance support: Single extremity supported Standing balance-Leahy Scale: Fair                               Pertinent Vitals/Pain See vitals flow sheet.     Home Living Family/patient expects to be discharged to:: Private residence (going to sister-in-law's house) Living Arrangements: Alone Available Help at Discharge: Family;Available 24 hours/day Type of Home: House Home Access: Stairs to enter Entrance Stairs-Rails: None Entrance Stairs-Number of Steps: 1 (one small stoop) Home Layout: One level   Additional Comments: wears bifocals    Prior Function Level of Independence: Independent         Comments: drives     Hand Dominance   Dominant Hand: Right    Extremity/Trunk Assessment   Upper Extremity Assessment: Defer to OT evaluation (pt reports decreased sensation in arm)           Lower Extremity Assessment: RLE deficits/detail;LLE deficits/detail RLE Deficits / Details: bil knee crepitus with transitions from sit to stand (audible and per pt painful).  LLE Deficits / Details: bil knee crepitus with transitions from sit to stand (audible and per pt painful).   Cervical / Trunk Assessment: Normal  Communication   Communication: No difficulties  Cognition Arousal/Alertness: Awake/alert Behavior During Therapy: WFL for tasks assessed/performed Overall Cognitive Status: Within Functional Limits for tasks  assessed                         Exercises Other Exercises Other Exercises: encouraged pt to try to move her fingers around to help push some of the swelling out of her digits.       Assessment/Plan    PT Assessment Patient needs continued PT services  PT Diagnosis Difficulty walking;Abnormality of gait;Generalized weakness;Acute pain   PT Problem List Decreased strength;Decreased activity tolerance;Decreased balance;Decreased  mobility;Decreased knowledge of use of DME;Decreased safety awareness;Decreased knowledge of precautions;Pain;Decreased range of motion  PT Treatment Interventions DME instruction;Gait training;Stair training;Functional mobility training;Therapeutic activities;Therapeutic exercise;Balance training;Neuromuscular re-education;Patient/family education;Modalities   PT Goals (Current goals can be found in the Care Plan section) Acute Rehab PT Goals Patient Stated Goal: to get better PT Goal Formulation: With patient Time For Goal Achievement: 01/02/14 Potential to Achieve Goals: Good    Frequency Min 5X/week    End of Session Equipment Utilized During Treatment: Other (comment) (right arm sling) Activity Tolerance: Patient tolerated treatment well Patient left: in chair;with call bell/phone within reach;with family/visitor present Nurse Communication: Mobility status         Time: 9147-8295 PT Time Calculation (min): 14 min   Charges:   PT Evaluation $Initial PT Evaluation Tier I: 1 Procedure PT Treatments $Gait Training: 8-22 mins        Danayah Smyre B. [place], [place], DPT [phone]   12/26/2013, 5:58 PM

## 2013-12-27 ENCOUNTER — Encounter (HOSPITAL_COMMUNITY): Payer: Self-pay | Admitting: Orthopedic Surgery

## 2013-12-27 MED ORDER — OXYCODONE-ACETAMINOPHEN 5-325 MG PO TABS: 1.0000 | ORAL_TABLET | ORAL | Status: DC | PRN

## 2013-12-27 MED ORDER — DIAZEPAM 5 MG PO TABS: 2.5000 mg | ORAL_TABLET | Freq: Four times a day (QID) | ORAL | Status: DC | PRN

## 2013-12-27 NOTE — Evaluation (Signed)
I have read and agree with this note.   Time XJ/DBZ:2:08-0:22  Total time:69 minutes (Eval, [place], 2TE)  [place], OTR/L [phone]

## 2013-12-27 NOTE — Progress Notes (Signed)
Patient discharged to home accompanied by family. Discharge instructions and rx given and explained and patient stated understanding. IV was removed and patient left unit in a stable condition with all personal belongings via wheelchair.  

## 2013-12-27 NOTE — Care Management Note (Addendum)
CARE MANAGEMENT NOTE 12/27/2013  Patient:  FRANNIE, SHEDRICK   Account Number:  [id number]  Date Initiated:  12/27/2013  Documentation initiated by:  Ricki Miller  Subjective/Objective Assessment:   73 yr old female s/p right reverse shoulder.     Action/Plan:   Case manager spoke with patient and sister-in-law concerning home heaqlth needs at discharge. Choice offered. Referral called to [facility], [place].   Anticipated DC Date:  12/27/2013   Anticipated DC Plan:  [place]  CM consult      [facility] Choice  HOME HEALTH   Choice offered to / List presented to:  C-1 Patient        [place] arranged  [place].   Status of service:  Completed, signed off Medicare Important Message given?  NA - LOS <3 / Initial given by admissions (If response is "NO", the following Medicare IM given date fields will be blank) Date Medicare IM given:   Date Additional Medicare IM given:    Discharge Disposition:  [place]  Per UR Regulation:  Reviewed for med. necessity/level of care/duration of stay  Comments: Patient will be recovering at the home of her sister in law, Jazariah [place]. [place], [place] [facility]         [phone] Cell.

## 2013-12-27 NOTE — Progress Notes (Signed)
Physical Therapy Treatment Patient Details Name: Hailey [name] MRN: [id number] DOB: December 31, 1940 Today's Date: 12/27/2013    History of Present Illness 73 y.o. female admitted to [facility] on 12/26/13 for elective R reverse TSA.  She has significant PMHx of bil knee OA (being followed by Dr. Elmyra Ricks).      PT Comments    Patient is progressing well towards physical therapy goals, ambulating up to 225 feet at min guard level for safety. Stair training completed and pt performs this task safely. She will have assistance at home and feel she is adequate for d/c.   Follow Up Recommendations  Outpatient PT;Supervision for mobility/OOB     Equipment Recommendations  None recommended by PT    Recommendations for Other Services       Precautions / Restrictions Precautions Precautions: Shoulder Shoulder Interventions: Shoulder sling/immobilizer Required Braces or Orthoses: Sling Restrictions Weight Bearing Restrictions: Yes RUE Weight Bearing: Non weight bearing (no orders for NWB)    Mobility  Bed Mobility Overal bed mobility: Modified Independent             General bed mobility comments: No cues needed, performs supine>sit with HOB elevated without assistance safely  Transfers Overall transfer level: Needs assistance Equipment used: None;Straight cane Transfers: Sit to/from Stand Sit to Stand: Min guard         General transfer comment: Min guard for safety, performed from lowest bed setting and from reclining chair x3 with and without SPC. No loss of balance. Complains of Lt knee pain but overall safe with this task at min guard level.  Ambulation/Gait Ambulation/Gait assistance: Min guard Ambulation Distance (Feet): 225 Feet (x2) Assistive device: Straight cane;None Gait Pattern/deviations: Decreased stride length   Gait velocity interpretation: Below normal speed for age/gender General Gait Details: Pt reports feeling unsteady due to pain medication. Educated on use of  single point cane for increased stability. Improved balance with assistive device and demonstrates correct use.   Stairs Stairs: Yes Stairs assistance: Min guard Stair Management: No rails;Step to pattern;Forwards Number of Stairs: 1 (x2) General stair comments: Min guard for safety due to decreased balance however, performed safely with no loss of balance.  Wheelchair Mobility    Modified Rankin (Stroke Patients Only)       Balance                                    Cognition Arousal/Alertness: Awake/alert Behavior During Therapy: WFL for tasks assessed/performed Overall Cognitive Status: Within Functional Limits for tasks assessed                      Exercises General Exercises - Lower Extremity Long Arc Quad: AROM;Left;10 reps;Seated    General Comments General comments (skin integrity, edema, etc.): Educated on long arc quad prior to standing from chair secondary to knee pain.      Pertinent Vitals/Pain Denies any pain. Patient repositioned in chair for comfort.     Home Living                      Prior Function            PT Goals (current goals can now be found in the care plan section) Acute Rehab PT Goals Patient Stated Goal: to get better PT Goal Formulation: With patient Time For Goal Achievement: 01/02/14 Potential to Achieve Goals: Good Progress towards PT goals: Progressing  toward goals    Frequency  Min 5X/week    PT Plan Current plan remains appropriate    Co-evaluation             End of Session Equipment Utilized During Treatment: Other (comment) (right arm sling) Activity Tolerance: Patient tolerated treatment well Patient left: in chair;with family/visitor present     Time: 3295-1884 PT Time Calculation (min): 14 min  Charges:  $Gait Training: 8-22 mins                    G Codes:      Camille Bal [place], [place]  Ellouise Newer 12/27/2013, 12:12 PM

## 2013-12-27 NOTE — Discharge Instructions (Signed)
° °  Kevin M. Supple, M.D., F.A.A.O.S. °Orthopaedic Surgery °Specializing in Arthroscopic and Reconstructive °Surgery of the Shoulder and Knee °[phone] °[address] - [place], [place] [address] - Fax [phone] ° ° °POST-OP TOTAL SHOULDER REPLACEMENT/SHOULDER HEMIARTHROPLASTY INSTRUCTIONS ° °1. Call the office at [phone] to schedule your first post-op appointment 10-14 days from the date of your surgery. ° °2. The bandage over your incision is waterproof. You may begin showering with this dressing on. You may leave this dressing on until first follow up appointment within 2 weeks. If you would like to remove it you may do so after the 5th day. Go slow and tug at the borders gently to break the bond the dressing has with the skin. The steri strips may come off with the dressing. At this point if there is no drainage it is okay to go without a bandage or you may cover it with a light guaze and tape. Leave the steri-strips in place over your incision. You can expect drainage that is bloody or yellow in nature that should gradually decrease from day of surgery. Change your dressing daily until drainage is completely resolved, then you may feel free to go without a bandage. You can also expect significant bruising around your shoulder that will drift down your arm and into your chest wall. This is very normal and should resolve over several days. ° ° 3. Wear your sling/immobilizer at all times except to perform the exercises below or to occasionally let your arm dangle by your side to stretch your elbow. You also need to sleep in your sling immobilizer until instructed otherwise. ° °4. Range of motion to your elbow, wrist, and hand are encouraged 3-5 times daily. Exercise to your hand and fingers helps to reduce swelling you may experience. ° °5. Utilize ice to the shoulder 3-5 times minimum a day and additionally if you are experiencing pain. ° °6. Prescriptions for a pain medication and a muscle  relaxant are provided for you. It is recommended that if you are experiencing pain that you pain medication alone is not controlling, add the muscle relaxant along with the pain medication which can give additional pain relief. The first 1-2 days is generally the most severe of your pain and then should gradually decrease. As your pain lessens it is recommended that you decrease your use of the pain medications to an "as needed basis'" only and to always comply with the recommended dosages of the pain medications. ° °7. Pain medications can produce constipation along with their use. If you experience this, the use of an over the counter stool softener or laxative daily is recommended.  ° °8. For most patients, if insurance allows, home health services to include therapy has been arranged. ° °9. For additional questions or concerns, please do not hesitate to call the office. If after hours there is an answering service to forward your concerns to the physician on call. ° °POST-OP EXERCISES ° °Pendulum Exercises ° °Perform pendulum exercises while standing and bending at the waist. Support your uninvolved arm on a table or chair and allow your operated arm to hang freely. Make sure to do these exercises passively - not using you shoulder muscles. ° °Repeat 20 times. Do 3 sessions per day. ° ° ° ° °

## 2013-12-27 NOTE — Evaluation (Signed)
Occupational Therapy Evaluation and Discharge Patient Details Name: Hailey [name] MRN: [id number] DOB: 1941-03-17 Today's Date: 12/27/2013    History of Present Illness 73 y.o. female admitted to [facility] on 12/26/13 for elective R reverse TSA.  She has significant PMHx of bil knee OA with audible crepitus (being followed by Dr. Elmyra Ricks).     Clinical Impression   Pt presents with decreased strength and limited ROM from above interfering with her ability to perform her ADLs independently. Pt was independent prior to admission and is now at a min assist level for UB bathing and dressing. Pt was educated on and demonstrated understanding of compensatory strategies for UB bathing and dressing, shoulder protocol exercises, and how to don/doff her sling. Pt will have assistance at home at d/c and therefore no further OT is needed, we will sign off.    Follow Up Recommendations  No OT follow up          Precautions / Restrictions Precautions Precautions: Shoulder Shoulder Interventions: Shoulder sling/immobilizer;Off for dressing/bathing/exercises Required Braces or Orthoses: Sling Restrictions Weight Bearing Restrictions: Yes RUE Weight Bearing: Non weight bearing      Mobility Bed Mobility Overal bed mobility: Modified Independent             General bed mobility comments: No cues needed, performs supine>sit with HOB flat without assistance safely  Transfers Overall transfer level: Needs assistance   Transfers: Sit to/from Stand Sit to Stand: Supervision          Balance Overall balance assessment: Needs assistance   Sitting balance-Leahy Scale: Fair       Standing balance-Leahy Scale: Fair                              ADL Overall ADL's : Needs assistance/impaired Eating/Feeding: Sitting;Modified independent   Grooming: Sitting;Modified independent   Upper Body Bathing: Minimal assitance;Standing;Cueing for compensatory techniques   Lower Body Bathing:  Sit to/from stand;Supervison/ safety   Upper Body Dressing : Standing;Cueing for compensatory techniques;Cueing for sequencing;Minimal assistance   Lower Body Dressing: Sit to/from stand;Minimal assistance   Toilet Transfer: BSC;Ambulation;Supervision/safety   Toileting- Clothing Manipulation and Hygiene: Sit to/from stand;Minimal assistance       Functional mobility during ADLs: Supervision/safety General ADL Comments: Educated on ways to compensatory strategies to modify UB bathing and dressing. Pt will have family to help with ADLs during recovery.               Pertinent Vitals/Pain No c/o pain, pt was premedicated.     Hand Dominance Right   Extremity/Trunk Assessment Upper Extremity Assessment Upper Extremity Assessment: Generalized weakness;RUE deficits/detail RUE Deficits / Details: limited ROM and decreased strength RUE Coordination: decreased gross motor           Communication Communication Communication: No difficulties   Cognition Arousal/Alertness: Awake/alert Behavior During Therapy: WFL for tasks assessed/performed Overall Cognitive Status: Within Functional Limits for tasks assessed                        Exercises Other Exercises Other Exercises: Educated on  AAROM shoulder flexion(up to 90 deg.), Abd (up to 60 deg.) and ER (up to 30 deg.). pt demonstrated  AAROM shoulder flexion(40 deg.), Abd (15 deg.) and ER (10 deg.)Pt educated on pendulums, elbow, wrist and hand AROM exercises. Pt will do 10 reps, 5x day of each and was given handout with protocol and pictures/description of exercises  Home Living Family/patient expects to be discharged to:: Private residence Living Arrangements: Alone Available Help at Discharge: Family;Available 24 hours/day Type of Home: House Home Access: Stairs to enter [employer] of Steps: 1 Entrance Stairs-Rails: None Home Layout: One level     Bathroom Shower/Tub: Animal nutritionist: Standard     Home Equipment: Bedside commode;Shower seat          Prior Functioning/Environment Level of Independence: Independent                      OT Goals(Current goals can be found in the care plan section) Acute Rehab OT Goals Patient Stated Goal: to get better OT Goal Formulation: With patient Time For Goal Achievement: 01/03/14 Potential to Achieve Goals: Good      End of Session    Activity Tolerance: Patient tolerated treatment well Patient left: in bed;with call bell/phone within reach   Time:  -    Charges:    G-Codes:    Lyda Perone 01-22-2014, 4:51 PM

## 2013-12-27 NOTE — Discharge Summary (Signed)
PATIENT ID:      Hailey [name]  MRN:     [id number] DOB/AGE:    1940/11/11 / 73 y.o.     DISCHARGE SUMMARY  ADMISSION DATE:    12/26/2013 DISCHARGE DATE:    ADMISSION DIAGNOSIS: RIGHT SHOULDER ROTATOR CUFF TEAR ARTHOPATHY  Past Medical History  Diagnosis Date  . Osteoporosis   . H/O exercise stress test     4-5 yrs. ago, told that it was wnl , done at [place] office   . 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 Cancer     pre-ca, L side of face     DISCHARGE DIAGNOSIS:   Active Problems:   S/P shoulder replacement   PROCEDURE: Procedure(s): RIGHT SHOULDER REVERSE ARTHROPLASTY on 12/26/2013  CONSULTS:   none  HISTORY:  See H&P in chart.  HOSPITAL COURSE:  Hailey [name] is a 73 y.o. admitted on 12/26/2013 with a chief complaint of right shoulder pain and dysfunction, and found to have a diagnosis of [place] .  They were brought to the operating room on 12/26/2013 and underwent Procedure(s): RIGHT SHOULDER REVERSE ARTHROPLASTY.    They were given perioperative antibiotics: Anti-infectives   Start     Dose/Rate Route Frequency Ordered Stop   12/26/13 1400  ceFAZolin (ANCEF) IVPB 1 g/50 mL premix     1 g 100 mL/hr over 30 Minutes Intravenous Every 6 hours 12/26/13 0947 12/27/13 0256   12/26/13 0600  ceFAZolin (ANCEF) IVPB 2 g/50 mL premix     2 g 100 mL/hr over 30 Minutes Intravenous On call to O.R. 12/25/13 1419 12/26/13 0815    .  Patient underwent the above named procedure and tolerated it well. The following day they were hemodynamically stable and pain was controlled on oral analgesics. They were neurovascularly intact to the operative extremity. OT was ordered and worked with patient per protocol. They were medically and orthopaedically stable for discharge on day 1.    DIAGNOSTIC STUDIES:  RECENT RADIOGRAPHIC STUDIES :  No results found.  RECENT VITAL SIGNS:  Patient Vitals for the past 24 hrs:  BP Temp Temp src Pulse Resp SpO2   12/27/13 0405 103/44 mmHg 99.1 F (37.3 C) Oral 76 16 93 %  12/26/13 2356 115/49 mmHg 99 F (37.2 C) Oral 79 16 95 %  12/26/13 2036 - 98.1 F (36.7 C) - - - -  12/26/13 1958 130/44 mmHg 98.1 F (36.7 C) Oral 75 16 95 %  12/26/13 1900 - 104 F (40 C) - - - -  12/26/13 1829 122/49 mmHg 98.2 F (36.8 C) Oral 76 20 97 %  12/26/13 1100 140/60 mmHg 97.6 F (36.4 C) Oral 69 16 96 %  12/26/13 1045 - 97.6 F (36.4 C) - - - -  12/26/13 1043 117/53 mmHg - - 64 15 98 %  12/26/13 1030 - - - 66 13 98 %  12/26/13 1028 118/54 mmHg - - 64 18 98 %  12/26/13 1015 - - - 66 18 95 %  12/26/13 1014 124/57 mmHg - - 66 16 98 %  12/26/13 1000 - - - 66 15 95 %  12/26/13 0958 122/59 mmHg - - 65 16 92 %  12/26/13 0949 - 97.7 F (36.5 C) - 61 - -  12/26/13 0945 - - - 59 17 95 %  12/26/13 0944 107/45 mmHg - - 58 17 97 %  .  RECENT EKG RESULTS:   No orders found  for this or any previous visit.  DISCHARGE INSTRUCTIONS:    DISCHARGE MEDICATIONS:     Medication List         aspirin 81 MG tablet  Take 81 mg by mouth daily.     CALTRATE 600+D 600-400 MG-UNIT per tablet  Generic drug:  Calcium Carbonate-Vitamin D  Take 1 tablet by mouth daily.     diazepam 5 MG tablet  Commonly known as:  VALIUM  Take 0.5-1 tablets (2.5-5 mg total) by mouth every 6 (six) hours as needed for muscle spasms or sedation.     ergocalciferol 50000 UNITS capsule  Commonly known as:  VITAMIN D2  Take 50,000 Units by mouth 2 (two) times a week. Friday & tuesdays     ibuprofen 200 MG tablet  Commonly known as:  ADVIL,MOTRIN  Take 200 mg by mouth every other day.     oxyCODONE-acetaminophen 5-325 MG per tablet  Commonly known as:  PERCOCET  Take 1-2 tablets by mouth every 4 (four) hours as needed.        FOLLOW UP VISIT:       Follow-up Information   Follow up with Marin Shutter, MD. (call to be seen in 10-14 days)    Specialty:  Orthopedic Surgery   Contact information:   [address] [place]  [address] [address] [phone]       DISCHARGE TO: Home  DISPOSITION: Good  DISCHARGE CONDITION:  Thereasa Parkin Hailey [name] for Dr. Justice Britain 12/27/2013, 7:29 AM

## 2013-12-27 NOTE — Op Note (Signed)
Hailey [name], Hailey [name] NO.:  [id number]  MEDICAL RECORD NO.:  [id number]  LOCATION:  [id number]                        FACILITY:  [facility]  PHYSICIAN:  Metta Clines. Karne Ozga, M.D.  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  PROCEDURE:  Right shoulder reverse arthroplasty utilizing a cemented size 12 stem, +3 polyethylene insert and a 38 eccentric glenosphere.  SURGEON:  Metta Clines. Daquavion Catala, M.D.  ASSISTANTLinus Orn Shuford, PA-C.  ANESTHESIA:  General endotracheal as well as an interscalene block.  ESTIMATED BLOOD LOSS:  200 mL.  DRAINS:  None.  HISTORY:  Hailey [name] is a 73 year old female who has had chronic and progressive increasing right shoulder pain related to end-stage rotator cuff tear arthropathy with significant functional limitations.  She is brought to the operating room at this time for planned right shoulder reverse arthroplasty.  Preoperatively, I had counseled Hailey [name] regarding treatment options and risks versus benefits thereof.  Possible surgical complications were all reviewed including potential for bleeding, infection, neurovascular injury, persistent pain, loss of motion, anesthetic complication, failure of the implant, and  possible need for additional surgery.  She understands and accepts and agrees with our planned procedure.  PROCEDURE IN DETAIL:  After undergoing routine preop evaluation, the patient received prophylactic antibiotics and interscalene block was established in the holding area by the Anesthesia Department.  Placed supine on the operating table, underwent smooth induction of a general endotracheal anesthesia.  Placed in the beach-chair position and appropriately padded and  protected.  The right shoulder girdle region was then sterilely prepped and draped in standard fashion.  Time-out was called.  An anterior approach to the right shoulder was made through a 10 cm deltopectoral  incision  Skin flaps were elevated.  Electrocautery was used for hemostasis.  Deltopectoral interval was identified, cephalic vein taken laterally with the deltoid, and interval was then developed from proximal to distal, and the upper centimeter and a half pec major was released to enhance exposure.  Adhesions divided beneath the deltoid and raphae retractor was placed.  The conjoined tendon was identified, mobilized, and retracted medially.  Biceps tendon was unroofed and tenotomized for later tenodesis and the rotator cuff then split to the base of the coracoid and the subscapularis was then divided from its attachment to the lesser tuberosity.  I should mention that the tendon was markedly attenuated superiorly.  We tagged the free margin for later repair.  Capsular adhesions were then divided away from the anterior and inferior aspect of the humeral head and coagulated the anterior circumflex vessels.  The rotator cuff was deficient superiorly and posterior superiorly.  The humeral head was then delivered through the wound and a central starting hole was directed to the apex of the humeral head which was markedly eburnated due to the abrasion against the acromion.  I performed hand reaming of the humeral canal up to size 14.  Extramedullary guide was then placed and using the outrigger guide.  We performed our humeral head resection at 0 degrees of retroversion with an oscillating saw.  At this point, the cut surface of the proximal humerus was then protected with the metal cap and exposed the glenoid with combination of Fukuda, pitchfork, and snake tongue retractors.  We performed a circumferential labral resection to gain access to the periphery of the glenoid with good  visualization.  The central guidepin was then placed and we reamed the glenoid to a subchondral bony base. The triple bone and debris was removed.  The central drill hole was then placed and we impacted our glenoid base plate, and then transfixed this with inferior, superior, and posterior locking screws and an anterior nonlocking screw, all of which obtained good bony purchase and good fixation.  The 38 eccentric glenosphere was then placed upon the base plate and sequentially tied with good fixation.  We then returned our attention to the proximal humerus where a size 12 stem was ultimately selected, seated, and then we performed reaming of the metaphyseal zone with the size 1 reamer.  A trial reduction was performed showing good soft tissue balance and good stability.  At this point, a distal cement plug was placed in the humeral canal.  The canal was irrigated, cleaned, and dried.  Cement was mixed and introduced into the humeral canal in retrograde fashion and the size 12 stem was then directed down to the appropriate depth at 0 degrees of retroversion.  Extra cement was removed.  Cement was allowed to harden. We then performed trial reduction, +3 poly again showed good fit.  The final +3 poly was then impacted into the humeral stem.  Final reduction was performed.  We were very pleased with the overall soft tissue balance, good stability, and good motion.  The subscapularis was then repaired back to the humeral neck anteriorly with #2 FiberWire through bone tunnels.  The biceps tendon was then tenodesed at the upper level of pec major tendon. Deltopectoral interval was then reapproximated with a series of figure- of-eight #1 Vicryl sutures.  I should mention we copiously irrigated the joint and obtained meticulous hemostasis at closure.  The subcu layer was then closed with 2-0 Vicryl and intracuticular 3-0 Monocryl for the skin followed by Dermabond and a dry dressing.  Right arm  was placed in a sling.  The patient was awakened, extubated, and taken to recovery room in stable condition.  Jenetta Loges, PA-C, was used as an Environmental consultant throughout this case, essential for help with positioning the patient, positioning the extremity, tissue manipulation, retraction, wound closure, implantation of the prosthesis, and intraoperative decision making.     Metta Clines. Ahmira Boisselle, M.D.     KMS/MEDQ  D:  12/26/2013  T:  12/27/2013  Job:  [id number]

## 2014-01-06 NOTE — Anesthesia Postprocedure Evaluation (Signed)
Anesthesia Post Note  Patient: Hailey [name]  Procedure(s) Performed: Procedure(s) (LRB): RIGHT SHOULDER REVERSE ARTHROPLASTY (Right)  Anesthesia type: General  Patient location: PACU  Post pain: Pain level controlled and Adequate analgesia  Post assessment: Post-op Vital signs reviewed, Patient's Cardiovascular Status Stable, Respiratory Function Stable, Patent Airway and Pain level controlled  Last Vitals:  Filed Vitals:   12/27/13 0405  BP: 103/44  Pulse: 76  Temp: 37.3 C  Resp: 16    Post vital signs: Reviewed and stable  Level of consciousness: awake, alert  and oriented  Complications: No apparent anesthesia complications

## 2014-04-08 ENCOUNTER — Ambulatory Visit: Payer: Medicare Other

## 2014-05-19 ENCOUNTER — Ambulatory Visit
Admission: RE | Admit: 2014-05-19 | Discharge: 2014-05-19 | Disposition: A | Discharge status: Home / Self Care | Payer: Medicare Other | Source: Ambulatory Visit

## 2014-05-19 DIAGNOSIS — Z1231 Encounter for screening mammogram for malignant neoplasm of breast: Secondary | ICD-10-CM

## 2014-10-03 ENCOUNTER — Encounter (HOSPITAL_COMMUNITY): Payer: Self-pay | Admitting: *Deleted

## 2014-10-03 DIAGNOSIS — M1990 Unspecified osteoarthritis, unspecified site: Secondary | ICD-10-CM | POA: Insufficient documentation

## 2014-10-03 DIAGNOSIS — Z87891 Personal history of nicotine dependence: Secondary | ICD-10-CM | POA: Insufficient documentation

## 2014-10-03 DIAGNOSIS — Z79899 Other long term (current) drug therapy: Secondary | ICD-10-CM | POA: Diagnosis not present

## 2014-10-03 DIAGNOSIS — Z859 Personal history of malignant neoplasm, unspecified: Secondary | ICD-10-CM | POA: Diagnosis not present

## 2014-10-03 DIAGNOSIS — M25511 Pain in right shoulder: Secondary | ICD-10-CM | POA: Diagnosis present

## 2014-10-03 DIAGNOSIS — Z7982 Long term (current) use of aspirin: Secondary | ICD-10-CM | POA: Insufficient documentation

## 2014-10-03 DIAGNOSIS — M810 Age-related osteoporosis without current pathological fracture: Secondary | ICD-10-CM | POA: Insufficient documentation

## 2014-10-03 DIAGNOSIS — Z9889 Other specified postprocedural states: Secondary | ICD-10-CM | POA: Insufficient documentation

## 2014-10-03 NOTE — ED Notes (Signed)
The pt is c/o rt shoulder spasms since feb.  She had rt shoulder replacement in June of last year.  Since then sh has had some pain.  Worse since February.  She has seen her  Orthopedic doctor and he reports her surgery did well

## 2014-10-03 NOTE — ED Notes (Signed)
No pain now.  Only hurts with movement

## 2014-10-04 ENCOUNTER — Emergency Department (HOSPITAL_COMMUNITY)
Admission: EM | Admit: 2014-10-04 | Discharge: 2014-10-04 | Disposition: A | Discharge status: Home / Self Care | Payer: Medicare Other | Attending: Emergency Medicine | Admitting: Emergency Medicine

## 2014-10-04 ENCOUNTER — Emergency Department (HOSPITAL_COMMUNITY): Payer: Medicare Other

## 2014-10-04 DIAGNOSIS — M25511 Pain in right shoulder: Secondary | ICD-10-CM

## 2014-10-04 MED ORDER — DIAZEPAM 5 MG PO TABS
5.0000 mg | ORAL_TABLET | Freq: Once | ORAL | Status: AC
  Administered 2014-10-04: 5 mg via ORAL
  Filled 2014-10-04: qty 1

## 2014-10-04 MED ORDER — OXYCODONE-ACETAMINOPHEN 5-325 MG PO TABS
2.0000 | ORAL_TABLET | Freq: Once | ORAL | Status: AC
  Administered 2014-10-04: 2 via ORAL
  Filled 2014-10-04: qty 2

## 2014-10-04 NOTE — Discharge Instructions (Signed)
Take your percocet for severe pain as prescribed by your doctor.   Take valium at night for spasms.   Use sling as needed.   Follow up with Dr. Onnie Graham. You can discuss with him about more physical therapy.   Return to ER if you have severe pain, unable to move shoulder.

## 2014-10-04 NOTE — ED Provider Notes (Signed)
CSN: [id number]     Arrival date & time 10/03/14  2156 History  This chart was scribed for Wandra Arthurs, MD by Eustaquio Maize, ED Scribe. This patient was seen in room A11C/A11C and the patient's care was started at 1:33 AM.    Chief Complaint  Patient presents with  . Arm Problem   The history is provided by the patient. No language interpreter was used.     HPI Comments: Hailey [name] is a 74 y.o. female with past surgical history of right shoulder replacement in June 2015 who presents to the Emergency Department complaining of right shoulder pain that began in February (1 month ago). Pt describes it as a muscle spasm that only occurs with arm movement. She states that symptoms are relieved with rest. Pt states that the pain was exacerbated tonight while traveling to [facility], causing her to come to the ED. Pt is currently only taking Aleve for the pain. Pt was prescribed Valium and Percocet s/p surgery but has not been taking them for the pain. She denies chest pain or any other symptoms.   Orthopedist - Dr. Onnie Graham    Past Medical History  Diagnosis Date  . Osteoporosis   . H/O exercise stress test     4-5 yrs. ago, told that it was wnl , done at [place] office   . 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 Cancer     pre-ca, L side of face    Past Surgical History  Procedure Laterality Date  . Foot tumor excision Left 1992    benign  . Breast cyst excision Left 1994    benign  . Cataract surgery Bilateral 2007  . Eye surgery      /w IOL  . Reverse shoulder arthroplasty Right 12/26/2013    Procedure: RIGHT SHOULDER REVERSE ARTHROPLASTY;  Surgeon: Marin Shutter, MD;  Location: [place];  Service: Orthopedics;  Laterality: Right;   No family history on file. History  Substance Use Topics  . Smoking status: Former Smoker    Quit date: 12/06/1968  . Smokeless tobacco: Former Systems developer  . Alcohol Use: No   OB History    No data available     Review of Systems   Cardiovascular: Negative for chest pain.  Musculoskeletal: Positive for arthralgias (Right shoulder pain. ).  All other systems reviewed and are negative.     Allergies  Forteo and Other  Home Medications   Prior to Admission medications   Medication Sig Start Date End Date Taking? Authorizing Provider  aspirin 81 MG tablet Take 81 mg by mouth daily.   Yes Historical Provider, MD  Calcium Carbonate-Vitamin D (CALTRATE 600+D) 600-400 MG-UNIT per tablet Take 1 tablet by mouth daily.   Yes Historical Provider, MD  ergocalciferol (VITAMIN D2) 50000 UNITS capsule Take 50,000 Units by mouth once a week. Friday   Yes Historical Provider, MD  diazepam (VALIUM) 5 MG tablet Take 0.5-1 tablets (2.5-5 mg total) by mouth every 6 (six) hours as needed for muscle spasms or sedation. Patient not taking: Reported on 10/04/2014 12/27/13   Jenetta Loges, PA-C  ibuprofen (ADVIL,MOTRIN) 200 MG tablet Take 200 mg by mouth every other day.    Historical Provider, MD  oxyCODONE-acetaminophen (PERCOCET) 5-325 MG per tablet Take 1-2 tablets by mouth every 4 (four) hours as needed. Patient not taking: Reported on 10/04/2014 12/27/13   Jenetta Loges, PA-C   Triage Vitals: BP 152/55 mmHg  Pulse 83  Temp(Src) 98.1 F (36.7 C) (Oral)  Resp 18  SpO2 97%   Physical Exam  Constitutional: She is oriented to person, place, and time. She appears well-developed and well-nourished. No distress.  HENT:  Head: Normocephalic and atraumatic.  Eyes: Conjunctivae and EOM are normal.  Neck: Neck supple. No tracheal deviation present.  Cardiovascular: Normal rate, regular rhythm and normal heart sounds.   Pulmonary/Chest: Effort normal and breath sounds normal. No respiratory distress.  Musculoskeletal: Normal range of motion. She exhibits tenderness (Tenderness to right bicep. ).  Bicep tendon intact, neurovascular intact   Neurological: She is alert and oriented to person, place, and time.  Skin: Skin is warm and dry.   Psychiatric: She has a normal mood and affect. Her behavior is normal.  Nursing note and vitals reviewed.   ED Course  Procedures (including critical care time)  DIAGNOSTIC STUDIES: Oxygen Saturation is 97% on RA, normal by my interpretation.    COORDINATION OF CARE: 1:36 AM-Discussed treatment plan which includes Right shoulder X Ray, pain medication, with pt at bedside and pt agreed to plan.   Labs Review Labs Reviewed - No data to display  Imaging Review Dg Shoulder Right  10/04/2014   CLINICAL DATA:  Chronic right shoulder pain, with muscle spasm. Initial encounter.  EXAM: RIGHT SHOULDER - 2+ VIEW  COMPARISON:  None.  FINDINGS: The patient's right shoulder arthroplasty appears grossly intact. There is no evidence of loosening. The somewhat off-center appearance of the prosthesis likely reflects arm positioning. The right acromioclavicular joint is unremarkable in appearance. The visualized portions of the right lung are clear.  IMPRESSION: Right shoulder arthroplasty appears grossly intact. No evidence of loosening. No evidence of fracture or dislocation.   Electronically Signed   By: Garald Balding M.D.   On: 10/04/2014 03:14     EKG Interpretation None      MDM   Final diagnoses:  Right shoulder pain    Hailey [name] is a 74 y.o. female here with R arm pain. Xray showed hardware intact. Pain improved with valium, percocet and she has more at home. She has sling at home. Recommend ortho f/u. May benefit from more physical therapy  I personally performed the services described in this documentation, which was scribed in my presence. The recorded information has been reviewed and is accurate.    Wandra Arthurs, MD 10/04/14 [phone]

## 2014-10-24 ENCOUNTER — Emergency Department (HOSPITAL_COMMUNITY): Payer: Medicare Other

## 2014-10-24 ENCOUNTER — Encounter (HOSPITAL_COMMUNITY): Payer: Self-pay | Admitting: Emergency Medicine

## 2014-10-24 ENCOUNTER — Emergency Department (HOSPITAL_COMMUNITY)
Admission: EM | Admit: 2014-10-24 | Discharge: 2014-10-24 | Disposition: A | Discharge status: Home / Self Care | Payer: Medicare Other | Attending: Emergency Medicine | Admitting: Emergency Medicine

## 2014-10-24 DIAGNOSIS — Z7982 Long term (current) use of aspirin: Secondary | ICD-10-CM | POA: Diagnosis not present

## 2014-10-24 DIAGNOSIS — R42 Dizziness and giddiness: Secondary | ICD-10-CM | POA: Diagnosis present

## 2014-10-24 DIAGNOSIS — Z87891 Personal history of nicotine dependence: Secondary | ICD-10-CM | POA: Insufficient documentation

## 2014-10-24 DIAGNOSIS — M1990 Unspecified osteoarthritis, unspecified site: Secondary | ICD-10-CM | POA: Diagnosis not present

## 2014-10-24 DIAGNOSIS — Z8589 Personal history of malignant neoplasm of other organs and systems: Secondary | ICD-10-CM | POA: Insufficient documentation

## 2014-10-24 DIAGNOSIS — H81399 Other peripheral vertigo, unspecified ear: Secondary | ICD-10-CM

## 2014-10-24 DIAGNOSIS — Z79899 Other long term (current) drug therapy: Secondary | ICD-10-CM | POA: Diagnosis not present

## 2014-10-24 LAB — CBC WITH DIFFERENTIAL/PLATELET
BASOS ABS: 0 10*3/uL (ref 0.0–0.1)
Basophils Relative: 0 % (ref 0–1)
Eosinophils Absolute: 0 10*3/uL (ref 0.0–0.7)
Eosinophils Relative: 1 % (ref 0–5)
HEMATOCRIT: 37.1 % (ref 36.0–46.0)
HEMOGLOBIN: 12 g/dL (ref 12.0–15.0)
LYMPHS PCT: 10 % — AB (ref 12–46)
Lymphs Abs: 0.6 10*3/uL — ABNORMAL LOW (ref 0.7–4.0)
MCH: 29.2 pg (ref 26.0–34.0)
MCHC: 32.3 g/dL (ref 30.0–36.0)
MCV: 90.3 fL (ref 78.0–100.0)
MONOS PCT: 7 % (ref 3–12)
Monocytes Absolute: 0.4 10*3/uL (ref 0.1–1.0)
NEUTROS ABS: 4.8 10*3/uL (ref 1.7–7.7)
Neutrophils Relative %: 82 % — ABNORMAL HIGH (ref 43–77)
Platelets: 248 10*3/uL (ref 150–400)
RBC: 4.11 MIL/uL (ref 3.87–5.11)
RDW: 14.6 % (ref 11.5–15.5)
WBC: 5.9 10*3/uL (ref 4.0–10.5)

## 2014-10-24 LAB — URINALYSIS, ROUTINE W REFLEX MICROSCOPIC
Bilirubin Urine: NEGATIVE
GLUCOSE, UA: NEGATIVE mg/dL
Hgb urine dipstick: NEGATIVE
Ketones, ur: NEGATIVE mg/dL
LEUKOCYTES UA: NEGATIVE
Nitrite: NEGATIVE
PROTEIN: NEGATIVE mg/dL
Specific Gravity, Urine: 1.006 (ref 1.005–1.030)
Urobilinogen, UA: 0.2 mg/dL (ref 0.0–1.0)
pH: 7.5 (ref 5.0–8.0)

## 2014-10-24 LAB — PROTIME-INR
INR: 1.05 (ref 0.00–1.49)
PROTHROMBIN TIME: 13.8 s (ref 11.6–15.2)

## 2014-10-24 LAB — COMPREHENSIVE METABOLIC PANEL
ALBUMIN: 3.4 g/dL — AB (ref 3.5–5.2)
ALT: 11 U/L (ref 0–35)
AST: 19 U/L (ref 0–37)
Alkaline Phosphatase: 67 U/L (ref 39–117)
Anion gap: 4 — ABNORMAL LOW (ref 5–15)
BUN: 22 mg/dL (ref 6–23)
CHLORIDE: 108 mmol/L (ref 96–112)
CO2: 29 mmol/L (ref 19–32)
Calcium: 9.1 mg/dL (ref 8.4–10.5)
Creatinine, Ser: 0.86 mg/dL (ref 0.50–1.10)
GFR calc Af Amer: 76 mL/min — ABNORMAL LOW (ref >90)
GFR, EST NON AFRICAN AMERICAN: 65 mL/min — AB (ref >90)
Glucose, Bld: 106 mg/dL — ABNORMAL HIGH (ref 70–99)
POTASSIUM: 4.2 mmol/L (ref 3.5–5.1)
SODIUM: 141 mmol/L (ref 135–145)
Total Bilirubin: 0.7 mg/dL (ref 0.3–1.2)
Total Protein: 6.1 g/dL (ref 6.0–8.3)

## 2014-10-24 LAB — TROPONIN I: Troponin I: <0.03 ng/mL (ref <0.031)

## 2014-10-24 MED ORDER — MECLIZINE HCL 25 MG PO TABS
25.0000 mg | ORAL_TABLET | Freq: Once | ORAL | Status: AC
  Administered 2014-10-24: 25 mg via ORAL
  Filled 2014-10-24: qty 1

## 2014-10-24 MED ORDER — MECLIZINE HCL 25 MG PO TABS: 25.0000 mg | ORAL_TABLET | Freq: Three times a day (TID) | ORAL | Status: DC | PRN

## 2014-10-24 MED ORDER — ONDANSETRON HCL 4 MG/2ML IJ SOLN
4.0000 mg | Freq: Once | INTRAMUSCULAR | Status: AC
  Administered 2014-10-24: 4 mg via INTRAVENOUS
  Filled 2014-10-24: qty 2

## 2014-10-24 NOTE — Discharge Instructions (Signed)

## 2014-10-24 NOTE — ED Notes (Signed)
Pt reports getting up from bed to urinate and had sudden onset of dizziness and nausea; pt reports starting a new BP medication (irbesartan); pt denies falls, CP, snycope, LOC, sob, or palpitations, or vision changes

## 2014-10-24 NOTE — ED Provider Notes (Signed)
CSN: [id number]     Arrival date & time 10/24/14  0445 History   First MD Initiated Contact with Patient 10/24/14 0522     Chief Complaint  Patient presents with  . Dizziness  . Nausea     (Consider location/radiation/quality/duration/timing/severity/associated sxs/prior Treatment) HPI Patient presents with acute onset room spinning sensation starting at 2:30 AM. She states she was in her normal health when she went to sleep. She woke to go to the restroom and sat on the edge of the bed when she had onset of her symptoms. She describes the floor were spinning. She denies any lightheadedness or syncope. She has accompanying nausea without any vomiting. She denies any visual changes, focal weakness or numbness. No slurred speech. Patient states she sat on the edge of the bed for 15 minutes with mild improvement of the vertigo. She then walked to the bathroom but states she was unsteady. She's feeling better in the emergency department but states the dizziness worsens when sitting or standing up. She denies any head trauma. No hearing changes or tinnitus. No previously similar symptoms. Past Medical History  Diagnosis Date  . Osteoporosis   . H/O exercise stress test     4-5 yrs. ago, told that it was wnl , done at [place] office   . 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 Cancer     pre-ca, L side of face    Past Surgical History  Procedure Laterality Date  . Foot tumor excision Left 1992    benign  . Breast cyst excision Left 1994    benign  . Cataract surgery Bilateral 2007  . Eye surgery      /w IOL  . Reverse shoulder arthroplasty Right 12/26/2013    Procedure: RIGHT SHOULDER REVERSE ARTHROPLASTY;  Surgeon: Marin Shutter, MD;  Location: [place];  Service: Orthopedics;  Laterality: Right;   History reviewed. No pertinent family history. History  Substance Use Topics  . Smoking status: Former Smoker    Quit date: 12/06/1968  . Smokeless tobacco: Former Systems developer  . Alcohol Use: No    OB History    No data available     Review of Systems  Constitutional: Negative for fever and chills.  HENT: Negative for ear pain and hearing loss.   Eyes: Negative for visual disturbance.  Respiratory: Negative for shortness of breath.   Cardiovascular: Negative for chest pain.  Gastrointestinal: Positive for nausea. Negative for vomiting, abdominal pain and diarrhea.  Genitourinary: Negative for dysuria and frequency.  Musculoskeletal: Positive for gait problem. Negative for neck pain and neck stiffness.  Skin: Negative for rash and wound.  Neurological: Positive for dizziness. Negative for syncope, weakness, light-headedness, numbness and headaches.  All other systems reviewed and are negative.     Allergies  Forteo and Other  Home Medications   Prior to Admission medications   Medication Sig Start Date End Date Taking? Authorizing Provider  aspirin 81 MG tablet Take 81 mg by mouth daily.    Historical Provider, MD  Calcium Carbonate-Vitamin D (CALTRATE 600+D) 600-400 MG-UNIT per tablet Take 1 tablet by mouth daily.    Historical Provider, MD  diazepam (VALIUM) 5 MG tablet Take 0.5-1 tablets (2.5-5 mg total) by mouth every 6 (six) hours as needed for muscle spasms or sedation. Patient not taking: Reported on 10/04/2014 12/27/13   Jenetta Loges, PA-C  ergocalciferol (VITAMIN D2) 50000 UNITS capsule Take 50,000 Units by mouth once a week. Friday  Historical Provider, MD  ibuprofen (ADVIL,MOTRIN) 200 MG tablet Take 200 mg by mouth every other day.    Historical Provider, MD  meclizine (ANTIVERT) 25 MG tablet Take 1 tablet (25 mg total) by mouth 3 (three) times daily as needed for dizziness. 10/24/14   Julianne Rice, MD  oxyCODONE-acetaminophen (PERCOCET) 5-325 MG per tablet Take 1-2 tablets by mouth every 4 (four) hours as needed. Patient not taking: Reported on 10/04/2014 12/27/13   Olivia Mackie Shuford, PA-C   BP 139/55 mmHg  Pulse 80  Temp(Src) 98.2 F (36.8 C) (Oral)  Resp 16   SpO2 96% Physical Exam  Constitutional: She is oriented to person, place, and time. She appears well-developed and well-nourished. No distress.  HENT:  Head: Normocephalic and atraumatic.  Mouth/Throat: Oropharynx is clear and moist.  Clear bilateral TMs  Eyes: EOM are normal. Pupils are equal, round, and reactive to light.  Horizontal nystagmus that appears to be fatigable  Neck: Normal range of motion. Neck supple.  No meningismus  Cardiovascular: Normal rate and regular rhythm.  Exam reveals no gallop and no friction rub.   No murmur heard. Pulmonary/Chest: Effort normal and breath sounds normal. No respiratory distress. She has no wheezes. She has no rales. She exhibits no tenderness.  Abdominal: Soft. Bowel sounds are normal. She exhibits no distension and no mass. There is no tenderness. There is no rebound and no guarding.  Musculoskeletal: Normal range of motion. She exhibits no edema or tenderness.  Neurological: She is alert and oriented to person, place, and time.  Patient is alert and oriented x3 with clear, goal oriented speech. Patient has 5/5 motor in all extremities. Sensation is intact to light touch. Bilateral finger-to-nose is normal with no signs of dysmetria.   Skin: Skin is warm and dry. No rash noted. No erythema.  Psychiatric: She has a normal mood and affect. Her behavior is normal.  Nursing note and vitals reviewed.   ED Course  Procedures (including critical care time) Labs Review Labs Reviewed  CBC WITH DIFFERENTIAL/PLATELET - Abnormal; Notable for the following:    Neutrophils Relative % 82 (*)    Lymphocytes Relative 10 (*)    Lymphs Abs 0.6 (*)    All other components within normal limits  COMPREHENSIVE METABOLIC PANEL - Abnormal; Notable for the following:    Glucose, Bld 106 (*)    Albumin 3.4 (*)    GFR calc non Af Amer 65 (*)    GFR calc Af Amer 76 (*)    Anion gap 4 (*)    All other components within normal limits  PROTIME-INR  TROPONIN I   URINALYSIS, ROUTINE W REFLEX MICROSCOPIC    Imaging Review Mr Brain Wo Contrast  10/24/2014   CLINICAL DATA:  Acute onset dizziness and nausea when getting up from bed. Patient recently started new blood pressure medication. No fall, syncope, loss of consciousness.  EXAM: MRI HEAD WITHOUT CONTRAST  TECHNIQUE: Multiplanar, multiecho pulse sequences of the brain and surrounding structures were obtained without intravenous contrast.  COMPARISON:  None.  FINDINGS: The ventricles and sulci are normal for patient's age. No abnormal parenchymal signal, mass lesions, mass effect. Patchy pontine and supratentorial white matter T2 hyperintensities. No reduced diffusion to suggest acute ischemia. A few punctate foci of susceptibility artifact in LEFT parietal lobe.  No abnormal extra-axial fluid collections. No extra-axial masses though, contrast enhanced sequences would be more sensitive. Normal major intracranial vascular flow voids seen at the skull base.  Ocular globes and orbital contents  are unremarkable though not tailored for evaluation. Bilateral ocular lens implants. No abnormal sellar expansion. Mild paranasal sinus mucosal thickening without air-fluid levels. No suspicious calvarial bone marrow signal. No abnormal sellar expansion. Craniocervical junction maintained.  IMPRESSION: No acute intracranial process, specifically no acute ischemia.  Involutional changes. Mild white matter changes suggest chronic small vessel ischemic disease.  Punctate susceptibility artifact in LEFT parietal lobe may reflect remote micro hemorrhages or, possible cavernomas.   Electronically Signed   By: Elon Alas   On: 10/24/2014 06:51     EKG Interpretation   Date/Time:  Friday October 24 2014 04:54:06 EDT Ventricular Rate:  78 PR Interval:  166 QRS Duration: 80 QT Interval:  386 QTC Calculation: 440 R Axis:   23 Text Interpretation:  Normal sinus rhythm Cannot rule out Anterior infarct  , age undetermined  Abnormal ECG Confirmed by Lita Mains  MD, Jauna Raczynski ([id number])  on 10/24/2014 6:09:09 AM      MDM   Final diagnoses:  Peripheral vertigo, unspecified laterality   Patient with acute onset vertigo starting at 2:30 AM. No focal neurologic findings. Likely peripheral in nature. Given age, will get MRI to rule out acute stroke.   No evidence of acute stroke. Questionable artifact in parietal lobe. Patient's symptoms are completely resolved. She is ambulatory to the bathroom without any difficulty. Condition have no focal weakness or numbness. Suspect peripheral vertigo. We'll start on meclizine when necessary. Advised to follow-up with neurology. She's been given return precautions and is voiced understanding.  Julianne Rice, MD 10/24/14 [phone]

## 2014-11-05 ENCOUNTER — Ambulatory Visit (INDEPENDENT_AMBULATORY_CARE_PROVIDER_SITE_OTHER): Payer: Medicare Other | Admitting: Neurology

## 2014-11-05 ENCOUNTER — Encounter: Payer: Self-pay | Admitting: Neurology

## 2014-11-05 VITALS — BP 110/64 | HR 78 | Temp 98.0°F | Ht 65.5 in | Wt 162.0 lb

## 2014-11-05 DIAGNOSIS — H8110 Benign paroxysmal vertigo, unspecified ear: Secondary | ICD-10-CM | POA: Diagnosis not present

## 2014-11-05 NOTE — Patient Instructions (Signed)
Overall you are doing fairly well but I do want to suggest a few things today:   Remember to drink plenty of fluid, eat healthy meals and do not skip any meals. Try to eat protein with a every meal and eat a healthy snack such as fruit or nuts in between meals. Try to keep a regular sleep-wake schedule and try to exercise daily, particularly in the form of walking, 20-30 minutes a day, if you can.   As far as your medications are concerned, I would like to suggest: Meclizine as needed  As far as diagnostic testing: None at this time  I would like to see you back as needed, sooner if we need to. Please call us with any interim questions, concerns, problems, updates or refill requests.   Please also call us for any test results so we can go over those with you on the phone.  My clinical assistant and will answer any of your questions and relay your messages to me and also relay most of my messages to you.   Our phone number is [phone]. We also have an after hours call service for urgent matters and there is a physician on-call for urgent questions. For any emergencies you know to call 911 or go to the nearest emergency room

## 2014-11-05 NOTE — Progress Notes (Signed)
[facility]    Provider:  Dr Jaynee Eagles Referring Provider: Prince Solian, MD Primary Care Physician:  Tivis Ringer, MD  CC:  dizziness  HPI:  Hailey [name] is a 74 y.o. female here as a referral from Dr. Dagmar Hait for dizziness  Had acute onset new dizziness. No headache. She sat up from bed, she stood up and the room was spinnind. Nausea. Almost vomitied, carried the trash can around. Lasted 30 minutes. Lasted for 30 minutes. She was just sitting on the side of the bed. Worse when she tried to lay back down, got sicker when she laid back down. hasnt happened again. Never happened before. No other focal neurologic deficits. Sitting up still was most comfortable. She had new medication before this but she had been on that for 2-3 months. No falls, no head trauma. No recent illnesses or colds. Slowly resolved after 30 minutes. Has not recurred.   Reviewed notes, labs and imaging from outside physicians, which showed:   MRI of the brain: personally reviewed images with patient, agree with findings  IMPRESSION: No acute intracranial process, specifically no acute ischemia.  Involutional changes. Mild white matter changes suggest chronic small vessel ischemic disease.  Punctate susceptibility artifact in LEFT parietal lobe may reflect remote micro hemorrhages or, possible cavernomas.  CBC unremarkable. CMP unremarkable.   Review of Systems: Patient complains of symptoms per HPI as well as the following symptoms: dizziness, no CP, no SOB. Pertinent negatives per HPI. All others negative.   History   Social History  . Marital Status: Single    Spouse Name: N/A  . Number of Children: 0  . Years of Education: 12   Occupational History  . Not on file.   Social History Main Topics  . Smoking status: Former Smoker -- 10 years    Types: Cigarettes    Quit date: 12/06/1968  . Smokeless tobacco: Former Systems developer  . Alcohol Use: No  . Drug Use: No  . Sexual  Activity: Not on file   Other Topics Concern  . Not on file   Social History Narrative   Lives at home by herself.   Right handed.   Caffeine use: 1 cup coffee per day and 2 glasses tea/day     Family History  Problem Relation Age of Onset  . Colon cancer Mother   . Heart Problems Mother   . Heart Problems Father   . Heart Problems Brother     Past Medical History  Diagnosis Date  . Osteoporosis   . H/O exercise stress test     4-5 yrs. ago, told that it was wnl , done at [place] office   . 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 Cancer     pre-ca, L side of face     Past Surgical History  Procedure Laterality Date  . Foot tumor excision Left 1992    benign  . Breast cyst excision Left 1994    benign  . Cataract surgery Bilateral 2007  . Eye surgery      /w IOL  . Reverse shoulder arthroplasty Right 12/26/2013    Procedure: RIGHT SHOULDER REVERSE ARTHROPLASTY;  Surgeon: Marin Shutter, MD;  Location: [place];  Service: Orthopedics;  Laterality: Right;    Current Outpatient Prescriptions  Medication Sig Dispense Refill  . aspirin 81 MG tablet Take 81 mg by mouth daily.    . Calcium Carbonate-Vitamin D (CALTRATE 600+D) 600-400 MG-UNIT per tablet Take 1 tablet  by mouth daily.    . ergocalciferol (VITAMIN D2) 50000 UNITS capsule Take 50,000 Units by mouth once a week. Friday    . ibuprofen (ADVIL,MOTRIN) 200 MG tablet Take 200 mg by mouth every other day.    . irbesartan (AVAPRO) 75 MG tablet Take 75 mg by mouth daily.    Marland Kitchen MYRBETRIQ 50 MG TB24 tablet Take 50 mg by mouth daily.    . diazepam (VALIUM) 5 MG tablet Take 0.5-1 tablets (2.5-5 mg total) by mouth every 6 (six) hours as needed for muscle spasms or sedation. (Patient not taking: Reported on 10/04/2014) 40 tablet 1  . meclizine (ANTIVERT) 25 MG tablet Take 1 tablet (25 mg total) by mouth 3 (three) times daily as needed for dizziness. (Patient not taking: Reported on 11/05/2014) 30 tablet 0  .  oxyCODONE-acetaminophen (PERCOCET) 5-325 MG per tablet Take 1-2 tablets by mouth every 4 (four) hours as needed. (Patient not taking: Reported on 10/04/2014) 60 tablet 0   No current facility-administered medications for this visit.    Allergies as of 11/05/2014 - Review Complete 11/05/2014  Allergen Reaction Noted  . Forteo [teriparatide (recombinant)] Other (See Comments) 10/24/2012  . Other Rash 10/24/2012    Vitals: BP 110/64 mmHg  Pulse 78  Temp(Src) 98 F (36.7 C)  Ht 5' 5.5" (1.664 m) Last Weight:  Wt Readings from Last 1 Encounters:  No data found for Wt   Last Height:   Ht Readings from Last 1 Encounters:  11/05/14 5' 5.5" (1.664 m)   Physical exam: Exam: Gen: NAD, conversant, well nourised, well groomed                     CV: RRR, no MRG. No Carotid Bruits. No peripheral edema, warm, nontender Eyes: Conjunctivae clear without exudates or hemorrhage  Neuro: Detailed Neurologic Exam  Speech:    Speech is normal; fluent and spontaneous with normal comprehension.  Cognition:    The patient is oriented to person, place, and time;     recent and remote memory intact;     language fluent;     normal attention, concentration,     fund of knowledge Cranial Nerves:    The pupils are equal, round, and reactive to light. The fundi are normal and spontaneous venous pulsations are present. Visual fields are full to finger confrontation. Extraocular movements are intact. Trigeminal sensation is intact and the muscles of mastication are normal. The face is symmetric. The palate elevates in the midline. Hearing intact. Voice is normal. Shoulder shrug is normal. The tongue has normal motion without fasciculations.   Coordination:    Normal finger to nose and heel to shin. Normal rapid alternating movements.   Gait:    Heel-toe and tandem gait are normal.   Motor Observation:    No asymmetry, no atrophy, and no involuntary movements noted. Tone:    Normal muscle tone.     Posture:    Posture is normal. normal erect    Strength:    Strength is V/V in the upper and lower limbs.      Sensation: intact to LT     Reflex Exam:  DTR's:    Deep tendon reflexes in the upper and lower extremities are normal bilaterally.   Toes:    The toes are downgoing bilaterally.   Clonus:    Clonus is absent.       Assessment/Plan:  74 year old female with acute onset dizziness likely peripheral in nature. MRI  of the brain was unremarkable. Continue daily asa. Follow with pcp for close management of vascular risk factors. Meclizine prn. Follow up again if needed.  Sarina Ill, MD  [facility] [address] [place], [place] [address]  Phone [phone] Fax [phone]

## 2014-11-10 DIAGNOSIS — H8110 Benign paroxysmal vertigo, unspecified ear: Secondary | ICD-10-CM | POA: Insufficient documentation

## 2015-03-03 ENCOUNTER — Encounter (HOSPITAL_COMMUNITY): Payer: Medicare Other

## 2015-03-03 ENCOUNTER — Ambulatory Visit (HOSPITAL_COMMUNITY)
Admission: RE | Admit: 2015-03-03 | Discharge: 2015-03-03 | Disposition: A | Discharge status: Home / Self Care | Payer: Medicare Other | Source: Ambulatory Visit | Attending: Internal Medicine | Admitting: Internal Medicine

## 2015-03-03 ENCOUNTER — Encounter (HOSPITAL_COMMUNITY): Payer: Self-pay

## 2015-03-03 DIAGNOSIS — M810 Age-related osteoporosis without current pathological fracture: Secondary | ICD-10-CM | POA: Insufficient documentation

## 2015-03-03 MED ORDER — SODIUM CHLORIDE 0.9 % IV SOLN
INTRAVENOUS | Status: DC
  Administered 2015-03-03: 250 mL via INTRAVENOUS

## 2015-03-03 MED ORDER — ZOLEDRONIC ACID 5 MG/100ML IV SOLN
5.0000 mg | Freq: Once | INTRAVENOUS | Status: AC
  Administered 2015-03-03: 5 mg via INTRAVENOUS
  Filled 2015-03-03: qty 100

## 2015-03-03 NOTE — Discharge Instructions (Signed)
RECLAST °Zoledronic Acid injection (Paget's Disease, Osteoporosis) °What is this medicine? °ZOLEDRONIC ACID (ZOE le dron ik AS id) lowers the amount of calcium loss from bone. It is used to treat Paget's disease and osteoporosis in women. °This medicine may be used for other purposes; ask your health care provider or pharmacist if you have questions. °COMMON BRAND NAME(S): Reclast, Zometa °What should I tell my health care provider before I take this medicine? °They need to know if you have any of these conditions: °-aspirin-sensitive asthma °-cancer, especially if you are receiving medicines used to treat cancer °-dental disease or wear dentures °-infection °-kidney disease °-low levels of calcium in the blood °-past surgery on the parathyroid gland or intestines °-receiving corticosteroids like dexamethasone or prednisone °-an unusual or allergic reaction to zoledronic acid, other medicines, foods, dyes, or preservatives °-pregnant or trying to get pregnant °-breast-feeding °How should I use this medicine? °This medicine is for infusion into a vein. It is given by a health care professional in a hospital or clinic setting. °Talk to your pediatrician regarding the use of this medicine in children. This medicine is not approved for use in children. °Overdosage: If you think you have taken too much of this medicine contact a poison control center or emergency room at once. °NOTE: This medicine is only for you. Do not share this medicine with others. °What if I miss a dose? °It is important not to miss your dose. Call your doctor or health care professional if you are unable to keep an appointment. °What may interact with this medicine? °-certain antibiotics given by injection °-NSAIDs, medicines for pain and inflammation, like ibuprofen or naproxen °-some diuretics like bumetanide, furosemide °-teriparatide °This list may not describe all possible interactions. Give your health care provider a list of all the  medicines, herbs, non-prescription drugs, or dietary supplements you use. Also tell them if you smoke, drink alcohol, or use illegal drugs. Some items may interact with your medicine. °What should I watch for while using this medicine? °Visit your doctor or health care professional for regular checkups. It may be some time before you see the benefit from this medicine. Do not stop taking your medicine unless your doctor tells you to. Your doctor may order blood tests or other tests to see how you are doing. °Women should inform their doctor if they wish to become pregnant or think they might be pregnant. There is a potential for serious side effects to an unborn child. Talk to your health care professional or pharmacist for more information. °You should make sure that you get enough calcium and vitamin D while you are taking this medicine. Discuss the foods you eat and the vitamins you take with your health care professional. °Some people who take this medicine have severe bone, joint, and/or muscle pain. This medicine may also increase your risk for jaw problems or a broken thigh bone. Tell your doctor right away if you have severe pain in your jaw, bones, joints, or muscles. Tell your doctor if you have any pain that does not go away or that gets worse. °Tell your dentist and dental surgeon that you are taking this medicine. You should not have major dental surgery while on this medicine. See your dentist to have a dental exam and fix any dental problems before starting this medicine. Take good care of your teeth while on this medicine. Make sure you see your dentist for regular follow-up appointments. °What side effects may I notice from receiving this   medicine? °Side effects that you should report to your doctor or health care professional as soon as possible: °-allergic reactions like skin rash, itching or hives, swelling of the face, lips, or tongue °-anxiety, confusion, or depression °-breathing  problems °-changes in vision °-eye pain °-feeling faint or lightheaded, falls °-jaw pain, especially after dental work °-mouth sores °-muscle cramps, stiffness, or weakness °-trouble passing urine or change in the amount of urine °Side effects that usually do not require medical attention (report to your doctor or health care professional if they continue or are bothersome): °-bone, joint, or muscle pain °-constipation °-diarrhea °-fever °-hair loss °-irritation at site where injected °-loss of appetite °-nausea, vomiting °-stomach upset °-trouble sleeping °-trouble swallowing °-weak or tired °This list may not describe all possible side effects. Call your doctor for medical advice about side effects. You may report side effects to FDA at 1-800-FDA-1088. °Where should I keep my medicine? °This drug is given in a hospital or clinic and will not be stored at home. °NOTE: This sheet is a summary. It may not cover all possible information. If you have questions about this medicine, talk to your doctor, pharmacist, or health care provider. °© 2015, [employer]. (2012-12-24 10:03:48) °Osteoporosis °Throughout your life, your body breaks down old bone and replaces it with new bone. As you get older, your body does not replace bone as quickly as it breaks it down. By the age of 30 years, most people begin to gradually lose bone because of the imbalance between bone loss and replacement. Some people lose more bone than others. Bone loss beyond a specified normal degree is considered osteoporosis.  °Osteoporosis affects the strength and durability of your bones. The inside of the ends of your bones and your flat bones, like the bones of your pelvis, look like honeycomb, filled with tiny open spaces. As bone loss occurs, your bones become less dense. This means that the open spaces inside your bones become bigger and the walls between these spaces become thinner. This makes your bones weaker. Bones of a person with  osteoporosis can become so weak that they can break (fracture) during minor accidents, such as a simple fall. °CAUSES  °The following factors have been associated with the development of osteoporosis: °· Smoking. °· Drinking more than 2 alcoholic drinks several days per week. °· Long-term use of certain medicines: °¨ Corticosteroids. °¨ Chemotherapy medicines. °¨ Thyroid medicines. °¨ Antiepileptic medicines. °¨ Gonadal hormone suppression medicine. °¨ Immunosuppression medicine. °· Being underweight. °· Lack of physical activity. °· Lack of exposure to the sun. This can lead to vitamin D deficiency. °· Certain medical conditions: °¨ Certain inflammatory bowel diseases, such as Crohn disease and ulcerative colitis. °¨ Diabetes. °¨ Hyperthyroidism. °¨ Hyperparathyroidism. °RISK FACTORS °Anyone can develop osteoporosis. However, the following factors can increase your risk of developing osteoporosis: °· Gender--Women are at higher risk than men. °· Age--Being older than 50 years increases your risk. °· Ethnicity--White and Asian people have an increased risk. °· Weight --Being extremely underweight can increase your risk of osteoporosis. °· Family history of osteoporosis--Having a family member who has developed osteoporosis can increase your risk. °SYMPTOMS  °Usually, people with osteoporosis have no symptoms.  °DIAGNOSIS  °Signs during a physical exam that may prompt your caregiver to suspect osteoporosis include: °· Decreased height. This is usually caused by the compression of the bones that form your spine (vertebrae) because they have weakened and become fractured. °· A curving or rounding of the upper back (kyphosis). °  To confirm signs of osteoporosis, your caregiver may request a procedure that uses 2 low-dose X-ray beams with different levels of energy to measure your bone mineral density (dual-energy X-ray absorptiometry [DXA]). Also, your caregiver may check your level of vitamin D. °TREATMENT  °The goal of  osteoporosis treatment is to strengthen bones in order to decrease the risk of bone fractures. There are different types of medicines available to help achieve this goal. Some of these medicines work by slowing the processes of bone loss. Some medicines work by increasing bone density. Treatment also involves making sure that your levels of calcium and vitamin D are adequate. °PREVENTION  °There are things you can do to help prevent osteoporosis. Adequate intake of calcium and vitamin D can help you achieve optimal bone mineral density. Regular exercise can also help, especially resistance and weight-bearing activities. If you smoke, quitting smoking is an important part of osteoporosis prevention. °MAKE SURE YOU: °· Understand these instructions. °· Will watch your condition. °· Will get help right away if you are not doing well or get worse. °FOR MORE INFORMATION °www.osteo.org and www.nof.org °Document Released: 04/20/2005 Document Revised: 11/05/2012 Document Reviewed: 06/25/2011 °ExitCare® Patient Information ©2015 [employer]. This information is not intended to replace advice given to you by your health care provider. Make sure you discuss any questions you have with your health care provider. ° ° °

## 2015-03-03 NOTE — Progress Notes (Signed)
Uneventful infusion of RECLAST. Pt discharged ambulatory unaccompanied to elevator to go to main lobby

## 2015-04-20 ENCOUNTER — Other Ambulatory Visit: Payer: Self-pay

## 2015-04-20 DIAGNOSIS — Z1231 Encounter for screening mammogram for malignant neoplasm of breast: Secondary | ICD-10-CM

## 2015-05-22 ENCOUNTER — Ambulatory Visit
Admission: RE | Admit: 2015-05-22 | Discharge: 2015-05-22 | Disposition: A | Discharge status: Home / Self Care | Payer: Medicare Other | Source: Ambulatory Visit

## 2015-05-22 DIAGNOSIS — Z1231 Encounter for screening mammogram for malignant neoplasm of breast: Secondary | ICD-10-CM

## 2015-06-25 ENCOUNTER — Other Ambulatory Visit: Payer: Self-pay | Admitting: Gastroenterology

## 2015-08-22 ENCOUNTER — Encounter (HOSPITAL_COMMUNITY): Payer: Self-pay

## 2015-08-22 ENCOUNTER — Emergency Department (HOSPITAL_COMMUNITY): Payer: Medicare Other

## 2015-08-22 ENCOUNTER — Emergency Department (HOSPITAL_COMMUNITY)
Admission: EM | Admit: 2015-08-22 | Discharge: 2015-08-22 | Disposition: A | Discharge status: Home / Self Care | Payer: Medicare Other | Attending: Emergency Medicine | Admitting: Emergency Medicine

## 2015-08-22 ENCOUNTER — Emergency Department (INDEPENDENT_AMBULATORY_CARE_PROVIDER_SITE_OTHER)
Admission: EM | Admit: 2015-08-22 | Discharge: 2015-08-22 | Disposition: A | Discharge status: Nursing Home (Includes ICF) | Payer: Medicare Other | Source: Home / Self Care | Attending: Family Medicine | Admitting: Family Medicine

## 2015-08-22 DIAGNOSIS — Z79899 Other long term (current) drug therapy: Secondary | ICD-10-CM | POA: Insufficient documentation

## 2015-08-22 DIAGNOSIS — Z7982 Long term (current) use of aspirin: Secondary | ICD-10-CM | POA: Insufficient documentation

## 2015-08-22 DIAGNOSIS — M6281 Muscle weakness (generalized): Secondary | ICD-10-CM | POA: Diagnosis not present

## 2015-08-22 DIAGNOSIS — R531 Weakness: Secondary | ICD-10-CM | POA: Diagnosis not present

## 2015-08-22 DIAGNOSIS — M810 Age-related osteoporosis without current pathological fracture: Secondary | ICD-10-CM | POA: Diagnosis not present

## 2015-08-22 DIAGNOSIS — I10 Essential (primary) hypertension: Secondary | ICD-10-CM | POA: Insufficient documentation

## 2015-08-22 DIAGNOSIS — Z9889 Other specified postprocedural states: Secondary | ICD-10-CM | POA: Insufficient documentation

## 2015-08-22 DIAGNOSIS — Z859 Personal history of malignant neoplasm, unspecified: Secondary | ICD-10-CM | POA: Insufficient documentation

## 2015-08-22 DIAGNOSIS — M1990 Unspecified osteoarthritis, unspecified site: Secondary | ICD-10-CM | POA: Diagnosis not present

## 2015-08-22 DIAGNOSIS — Z87891 Personal history of nicotine dependence: Secondary | ICD-10-CM | POA: Insufficient documentation

## 2015-08-22 DIAGNOSIS — R29898 Other symptoms and signs involving the musculoskeletal system: Secondary | ICD-10-CM

## 2015-08-22 HISTORY — DX: Essential (primary) hypertension: I10

## 2015-08-22 LAB — CBC WITH DIFFERENTIAL/PLATELET
BASOS ABS: 0 10*3/uL (ref 0.0–0.1)
BASOS PCT: 0 %
EOS ABS: 0 10*3/uL (ref 0.0–0.7)
EOS PCT: 0 %
HEMATOCRIT: 36 % (ref 36.0–46.0)
Hemoglobin: 11.4 g/dL — ABNORMAL LOW (ref 12.0–15.0)
Lymphocytes Relative: 12 %
Lymphs Abs: 0.6 10*3/uL — ABNORMAL LOW (ref 0.7–4.0)
MCH: 29.2 pg (ref 26.0–34.0)
MCHC: 31.7 g/dL (ref 30.0–36.0)
MCV: 92.1 fL (ref 78.0–100.0)
MONO ABS: 0.4 10*3/uL (ref 0.1–1.0)
MONOS PCT: 8 %
Neutro Abs: 4 10*3/uL (ref 1.7–7.7)
Neutrophils Relative %: 80 %
PLATELETS: 246 10*3/uL (ref 150–400)
RBC: 3.91 MIL/uL (ref 3.87–5.11)
RDW: 14.3 % (ref 11.5–15.5)
WBC: 5.1 10*3/uL (ref 4.0–10.5)

## 2015-08-22 LAB — BASIC METABOLIC PANEL
Anion gap: 8 (ref 5–15)
BUN: 22 mg/dL — AB (ref 6–20)
CO2: 25 mmol/L (ref 22–32)
CREATININE: 0.93 mg/dL (ref 0.44–1.00)
Calcium: 9.1 mg/dL (ref 8.9–10.3)
Chloride: 107 mmol/L (ref 101–111)
GFR calc Af Amer: >60 mL/min (ref >60)
GFR, EST NON AFRICAN AMERICAN: 59 mL/min — AB (ref >60)
GLUCOSE: 113 mg/dL — AB (ref 65–99)
POTASSIUM: 4.8 mmol/L (ref 3.5–5.1)
SODIUM: 140 mmol/L (ref 135–145)

## 2015-08-22 LAB — URINALYSIS, ROUTINE W REFLEX MICROSCOPIC
BILIRUBIN URINE: NEGATIVE
Glucose, UA: NEGATIVE mg/dL
Hgb urine dipstick: NEGATIVE
KETONES UR: NEGATIVE mg/dL
LEUKOCYTES UA: NEGATIVE
Nitrite: NEGATIVE
Protein, ur: NEGATIVE mg/dL
Specific Gravity, Urine: 1.011 (ref 1.005–1.030)
pH: 5 (ref 5.0–8.0)

## 2015-08-22 LAB — POCT I-STAT, CHEM 8
BUN: 27 mg/dL — ABNORMAL HIGH (ref 6–20)
CALCIUM ION: 1.27 mmol/L (ref 1.13–1.30)
CREATININE: 1 mg/dL (ref 0.44–1.00)
Chloride: 102 mmol/L (ref 101–111)
GLUCOSE: 109 mg/dL — AB (ref 65–99)
HCT: 41 % (ref 36.0–46.0)
HEMOGLOBIN: 13.9 g/dL (ref 12.0–15.0)
POTASSIUM: 4.2 mmol/L (ref 3.5–5.1)
Sodium: 141 mmol/L (ref 135–145)
TCO2: 28 mmol/L (ref 0–100)

## 2015-08-22 MED ORDER — SODIUM CHLORIDE 0.9 % IV BOLUS (SEPSIS)
250.0000 mL | Freq: Once | INTRAVENOUS | Status: DC
  Administered 2015-08-22: 250 mL via INTRAVENOUS

## 2015-08-22 NOTE — ED Provider Notes (Signed)
CSN: [id number]     Arrival date & time 08/22/15  1704 History   First MD Initiated Contact with Patient 08/22/15 1714     Chief Complaint  Patient presents with  . Gait Problem     (Consider location/radiation/quality/duration/timing/severity/associated sxs/prior Treatment) HPI Comments: 75 year old female with arthritis and osteoporosis who presents with right leg weakness. The patient states that for the past 3 weeks, she has had intermittent episodes of "feeling shaky" when she is walking. She thinks that it is because she sometimes feels that her right leg is weak and she is dragging it. During these episodes, she has noticed a pain in her right buttock that does not radiate. She denies any back pain. No bowel/bladder incontinence or saddle anesthesia, no extremity numbness. She presents today because since 10 AM her symptoms have been persistent rather than intermittent. She also notes 1 month of intermittent upper extremity weakness; her right arm is weaker because of previous shoulder surgery but she does note some left arm weakness occasionally. She denies any headache, neck pain, visual changes, fevers, vomiting, abdominal pain, recent illness. She currently denies any complaints.  The history is provided by the patient.    Past Medical History  Diagnosis Date  . Osteoporosis   . H/O exercise stress test     4-5 yrs. ago, told that it was wnl , done at [place] office   . 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 Cancer (HCC)     pre-ca, L side of face   . Hypertension    Past Surgical History  Procedure Laterality Date  . Foot tumor excision Left 1992    benign  . Breast cyst excision Left 1994    benign  . Cataract surgery Bilateral 2007  . Eye surgery      /w IOL  . Reverse shoulder arthroplasty Right 12/26/2013    Procedure: RIGHT SHOULDER REVERSE ARTHROPLASTY;  Surgeon: Marin Shutter, MD;  Location: [place] [facility];  Service: Orthopedics;  Laterality: Right;   Family History   Problem Relation Age of Onset  . Colon cancer Mother   . Heart Problems Mother   . Heart Problems Father   . Heart Problems Brother    Social History  Substance Use Topics  . Smoking status: Former Smoker -- 10 years    Types: Cigarettes    Quit date: 12/06/1968  . Smokeless tobacco: Former Systems developer  . Alcohol Use: No   OB History    No data available     Review of Systems 10 Systems reviewed and are negative for acute change except as noted in the HPI.    Allergies  Forteo and Other  Home Medications   Prior to Admission medications   Medication Sig Start Date End Date Taking? Authorizing Provider  acetaminophen (TYLENOL) 650 MG CR tablet Take 1,300 mg by mouth every 8 (eight) hours as needed for pain.   Yes Historical Provider, MD  amoxicillin (AMOXIL) 500 MG capsule Take 2,000 mg by mouth See admin instructions. Take 4 capsules (2000 mg) by mouth one hour prior to dental appointment 07/31/15  Yes Historical Provider, MD  aspirin EC 81 MG tablet Take 81 mg by mouth daily.   Yes Historical Provider, MD  Calcium Carbonate-Vitamin D (CALTRATE 600+D) 600-400 MG-UNIT per tablet Take 1 tablet by mouth daily.   Yes Historical Provider, MD  hydrochlorothiazide (MICROZIDE) 12.5 MG capsule Take 12.5 mg by mouth daily.   Yes Historical Provider, MD  irbesartan (AVAPRO) 75 MG tablet Take 75 mg by mouth daily. 10/29/14  Yes Historical Provider, MD  Polyethyl Glycol-Propyl Glycol (SYSTANE OP) Place 1 drop into both eyes 2 (two) times daily.   Yes Historical Provider, MD  Vitamin D, Ergocalciferol, (DRISDOL) 50000 units CAPS capsule Take 50,000 Units by mouth 2 (two) times a week. Tuesdays and Fridays   Yes Historical Provider, MD  diazepam (VALIUM) 5 MG tablet Take 0.5-1 tablets (2.5-5 mg total) by mouth every 6 (six) hours as needed for muscle spasms or sedation. Patient not taking: Reported on 10/04/2014 12/27/13   Jenetta Loges, PA-C  meclizine (ANTIVERT) 25 MG tablet Take 1 tablet (25 mg  total) by mouth 3 (three) times daily as needed for dizziness. Patient not taking: Reported on 08/22/2015 10/24/14   Julianne Rice, MD   BP 137/70 mmHg  Pulse 77  Temp(Src) 98.5 F (36.9 C) (Oral)  Resp 17  Ht 5\' 5"  (1.651 m)  Wt 160 lb (72.576 kg)  BMI 26.63 kg/m2  SpO2 95% Physical Exam  Constitutional: She is oriented to person, place, and time. She appears well-developed and well-nourished. No distress.  Awake, alert  HENT:  Head: Normocephalic and atraumatic.  Moist mucous membranes  Eyes: Conjunctivae and EOM are normal. Pupils are equal, round, and reactive to light.  Neck: Neck supple.  Cardiovascular: Normal rate, regular rhythm and normal heart sounds.   No murmur heard. Pulmonary/Chest: Effort normal and breath sounds normal. No respiratory distress.  Abdominal: Soft. Bowel sounds are normal. She exhibits no distension. There is no tenderness.  Musculoskeletal: She exhibits no edema.  5/5 strength and normal sensation throughout  Neurological: She is alert and oriented to person, place, and time. She has normal reflexes. No cranial nerve deficit. She exhibits normal muscle tone.  Fluent speech, normal finger to nose, negative pronator drift, negative Romberg; normal gait  Skin: Skin is warm and dry.  Psychiatric: She has a normal mood and affect. Judgment and thought content normal.  Nursing note and vitals reviewed.   ED Course  Procedures (including critical care time) Labs Review Labs Reviewed  BASIC METABOLIC PANEL - Abnormal; Notable for the following:    Glucose, Bld 113 (*)    BUN 22 (*)    GFR calc non Af Amer 59 (*)    All other components within normal limits  CBC WITH DIFFERENTIAL/PLATELET - Abnormal; Notable for the following:    Hemoglobin 11.4 (*)    Lymphs Abs 0.6 (*)    All other components within normal limits  URINALYSIS, ROUTINE W REFLEX MICROSCOPIC (NOT AT [facility])    Imaging Review Dg Lumbar Spine 2-3 Views  08/22/2015  CLINICAL DATA:   75 year old with right foot weakness over the past 2-3 weeks. No known injury. EXAM: LUMBAR SPINE - 2-3 VIEW COMPARISON:  None. FINDINGS: Five non rib-bearing lumbar vertebrae with anatomic alignment. Very slight thoracolumbar scoliosis convex right. No fractures. Well preserved disc spaces. Facet degenerative changes at L5-S1, right greater than left. Sacroiliac joints intact. IMPRESSION: Well preserved disc spaces throughout the lumbar spine. Facet degenerative changes at L5-S1, right greater than left. Electronically Signed   By: Evangeline Dakin M.D.   On: 08/22/2015 18:34   Mr Brain Wo Contrast (neuro Protocol)  08/22/2015  CLINICAL DATA:  Intermittent right leg weakness and upper extremity weakness. Intermittent problem. Current episode began today. EXAM: MRI HEAD WITHOUT CONTRAST TECHNIQUE: Multiplanar, multiecho pulse sequences of the brain and surrounding structures were obtained without intravenous contrast. COMPARISON:  10/24/2014  FINDINGS: Diffusion imaging does not show any acute or subacute infarction. There chronic small-vessel ischemic changes affecting the pons. No cerebellar abnormality. 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 No cortical or large vessel territory infarction. No mass lesion, hemorrhage, hydrocephalus or extra-axial collection. No change since the previous examination. No pituitary mass. Sinuses, middle ears and mastoids are clear. IMPRESSION: No change since last year. 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 No acute or reversible finding. Electronically Signed   By: Nelson Chimes M.D.   On: 08/22/2015 20:52   I have personally reviewed and evaluated these lab results as part of my medical decision-making.   EKG Interpretation   Date/Time:  Saturday August 22 2015 17:17:41  EST Ventricular Rate:  74 PR Interval:  162 QRS Duration: 84 QT Interval:  399 QTC Calculation: 443 R Axis:   39 Text Interpretation:  Age not entered, assumed to be  75 years old for  purpose of ECG interpretation Sinus rhythm No significant change since  last tracing Confirmed by LITTLE MD, RACHEL [phone]) on 08/22/2015 5:21:36  PM      MDM   Final diagnoses:  Weakness of right foot   Pt p/w 3 weeks of intermittent "shakiness" while walking which she attributes to right leg weakness that is transient and is always associated with right buttock pain. Patient denies any back pain or other neurologic symptoms. On exam, she was well-appearing with reassuring vital signs. No neurologic deficits on exam and normal strength and sensation of bilateral lower extremities. Normal gait. No symptoms to suggest cauda equina and no recent trauma. EKG on arrival is unchanged from previous. Obtained basic lab work as well as lumbar spine film. Because of the patient's report of intermittent symptoms as well as intermittent upper extremity weakness, obtained an MRI of the brain to rule out intracranial process such as stroke.  Basic lab work unremarkable, UA normal. Lumbar spine negative acute. MRI shows no acute findings, chronic changes that are unchanged from last year. Because the patient reports pain in her buttock every time that she has the foot weakness, it is possible that she has radicular symptoms such as sciatica. She has ambulated normally for me and I am reassured that her neuro exam is normal. I have discussed importance of follow-up with PCP for physical therapy referral. I have also reviewed return precautions including the development of any other neurologic symptoms or severe leg weakness. Patient voiced understanding and was discharged in satisfactory condition.  Sharlett Iles, MD 08/22/15 2108

## 2015-08-22 NOTE — ED Notes (Signed)
To room via [facility] from u/c.  Onset 3 weeks intermittant "feeling funny".  When nurse asked what this means she stated "feels shaky"; and whenever pt takes 2 steps she steps backwards, this usually less than 1 hour.  Onset 10 today symptoms persisted.  CBG 100.  No other neuro deficits.

## 2015-08-22 NOTE — ED Notes (Signed)
Pt back from MRI 

## 2015-08-22 NOTE — ED Notes (Signed)
Pt ambulatory to bathroom with stand by assist, steady gait noted

## 2015-08-22 NOTE — ED Notes (Signed)
EDP at bedside  

## 2015-08-22 NOTE — ED Notes (Signed)
Patient complains of "not feeling herself" for the past couple of weeks Patient feels weak and is anxious and not just herself

## 2015-08-22 NOTE — ED Notes (Signed)
Pt to MRI

## 2015-08-22 NOTE — ED Provider Notes (Addendum)
CSN: [id number]     Arrival date & time 08/22/15  1545 History   First MD Initiated Contact with Patient 08/22/15 1552     Chief Complaint  Patient presents with  . Weakness   (Consider location/radiation/quality/duration/timing/severity/associated sxs/prior Treatment) Patient is a 75 y.o. female presenting with weakness. The history is provided by the patient and a relative.  Weakness This is a recurrent problem. The current episode started 3 to 5 hours ago (recurrent episodes of feeling weak, another this am, here for eval, no pain, no speech problems or ext weakness). The problem has not changed since onset.Pertinent negatives include no chest pain, no abdominal pain, no headaches and no shortness of breath.    Past Medical History  Diagnosis Date  . Osteoporosis   . H/O exercise stress test     4-5 yrs. ago, told that it was wnl , done at [place] office   . 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 Foot tumor excision Left 1992    benign  . Breast cyst excision Left 1994    benign  . Cataract surgery Bilateral 2007  . Eye surgery      /w IOL  . Reverse shoulder arthroplasty Right 12/26/2013    Procedure: RIGHT SHOULDER REVERSE ARTHROPLASTY;  Surgeon: Marin Shutter, MD;  Location: [facility];  Service: Orthopedics;  Laterality: Right;   Family History  Problem Relation Age of Onset  . Colon cancer Mother   . Heart Problems Mother   . Heart Problems Father   . Heart Problems Brother    Social History  Substance Use Topics  . Smoking status: Former Smoker -- 10 years    Types: Cigarettes    Quit date: 12/06/1968  . Smokeless tobacco: Former Systems developer  . Alcohol Use: No   OB History    No data available     Review of Systems  Constitutional: Positive for fatigue.  Respiratory: Negative.  Negative for shortness of breath.   Cardiovascular: Negative.  Negative for chest pain.   Gastrointestinal: Negative.  Negative for abdominal pain.  Musculoskeletal: Negative for gait problem.  Skin: Negative.   Neurological: Positive for weakness. Negative for dizziness, speech difficulty, numbness and headaches.  All other systems reviewed and are negative.   Allergies  Forteo and Other  Home Medications   Prior to Admission medications   Medication Sig Start Date End Date Taking? Authorizing Provider  aspirin 81 MG tablet Take 81 mg by mouth daily.    Historical Provider, MD  Calcium Carbonate-Vitamin D (CALTRATE 600+D) 600-400 MG-UNIT per tablet Take 1 tablet by mouth daily.    Historical Provider, MD  diazepam (VALIUM) 5 MG tablet Take 0.5-1 tablets (2.5-5 mg total) by mouth every 6 (six) hours as needed for muscle spasms or sedation. Patient not taking: Reported on 10/04/2014 12/27/13   Jenetta Loges, PA-C  ergocalciferol (VITAMIN D2) 50000 UNITS capsule Take 50,000 Units by mouth once a week. Friday    Historical Provider, MD  hydrochlorothiazide (MICROZIDE) 12.5 MG capsule Take 12.5 mg by mouth daily.    Historical Provider, MD  ibuprofen (ADVIL,MOTRIN) 200 MG tablet Take 200 mg by mouth every other day.    Historical Provider, MD  irbesartan (AVAPRO) 75 MG tablet Take 75 mg by mouth daily. 10/29/14   Historical Provider, MD  meclizine (ANTIVERT) 25 MG  tablet Take 1 tablet (25 mg total) by mouth 3 (three) times daily as needed for dizziness. 10/24/14   Julianne Rice, MD  MYRBETRIQ 50 MG TB24 tablet Take 50 mg by mouth daily. 10/17/14   Historical Provider, MD  naproxen sodium (ANAPROX) 220 MG tablet Take 220 mg by mouth as needed.    Historical Provider, MD  oxyCODONE-acetaminophen (PERCOCET) 5-325 MG per tablet Take 1-2 tablets by mouth every 4 (four) hours as needed. Patient not taking: Reported on 10/04/2014 12/27/13   Jenetta Loges, PA-C   Meds Ordered and Administered this Visit   Medications  sodium chloride 0.9 % bolus 250 mL (250 mLs Intravenous Given 08/22/15 1619)     BP 150/58 mmHg  Pulse 79  Temp(Src) 97.6 F (36.4 C) (Oral)  Resp 18  SpO2 100% No data found.   Physical Exam  Constitutional: She is oriented to person, place, and time. She appears well-developed and well-nourished. No distress.  Neck: Normal range of motion. Neck supple.  Cardiovascular: Normal rate, regular rhythm, normal heart sounds and intact distal pulses.   Pulmonary/Chest: Effort normal and breath sounds normal.  Neurological: She is alert and oriented to person, place, and time. She has normal strength. No cranial nerve deficit or sensory deficit. Coordination and gait normal.  Skin: Skin is warm and dry.  Nursing note and vitals reviewed.   ED Course  Procedures (including critical care time)  Labs Review Labs Reviewed  POCT I-STAT, CHEM 8 - Abnormal; Notable for the following:    BUN 27 (*)    Glucose, Bld 109 (*)    All other components within normal limits   i-stat as noted.  Imaging Review No results found.   Visual Acuity Review  Right Eye Distance:   Left Eye Distance:   Bilateral Distance:    Right Eye Near:   Left Eye Near:    Bilateral Near:         MDM   1. Weakness generalized    Sent for eval of 75 yo female with sx of off and on gen weakness, no focal sx.    Billy Fischer, MD 08/22/15 [id number]  Billy Fischer, MD 08/22/15 [phone]

## 2015-08-24 LAB — CBG MONITORING, ED: Glucose-Capillary: 100 mg/dL — ABNORMAL HIGH (ref 65–99)

## 2016-03-24 ENCOUNTER — Encounter (HOSPITAL_COMMUNITY): Payer: Self-pay

## 2016-03-24 ENCOUNTER — Ambulatory Visit (HOSPITAL_COMMUNITY)
Admission: RE | Admit: 2016-03-24 | Discharge: 2016-03-24 | Disposition: A | Discharge status: Home / Self Care | Payer: Medicare Other | Source: Ambulatory Visit | Attending: Internal Medicine | Admitting: Internal Medicine

## 2016-03-24 DIAGNOSIS — M810 Age-related osteoporosis without current pathological fracture: Secondary | ICD-10-CM | POA: Insufficient documentation

## 2016-03-24 MED ORDER — ZOLEDRONIC ACID 5 MG/100ML IV SOLN
5.0000 mg | Freq: Once | INTRAVENOUS | Status: AC
  Administered 2016-03-24: 5 mg via INTRAVENOUS
  Filled 2016-03-24: qty 100

## 2016-03-24 MED ORDER — SODIUM CHLORIDE 0.9 % IV SOLN
Freq: Once | INTRAVENOUS | Status: AC
  Administered 2016-03-24: 12:00:00 via INTRAVENOUS

## 2016-03-24 NOTE — Discharge Instructions (Signed)

## 2016-03-24 NOTE — Progress Notes (Signed)
Tolerated Reclast infusion well. No signs of adverse reaction noted. Instructed to call Dr. Danna Hefty office for problems and concerns once discharged from Short Stay. Patient verbalized understanding.

## 2016-04-27 ENCOUNTER — Other Ambulatory Visit: Payer: Self-pay | Admitting: Internal Medicine

## 2016-04-27 DIAGNOSIS — Z1231 Encounter for screening mammogram for malignant neoplasm of breast: Secondary | ICD-10-CM

## 2016-06-02 ENCOUNTER — Ambulatory Visit: Payer: Medicare Other

## 2016-06-03 ENCOUNTER — Ambulatory Visit
Admission: RE | Admit: 2016-06-03 | Discharge: 2016-06-03 | Disposition: A | Discharge status: Home / Self Care | Payer: Medicare Other | Source: Ambulatory Visit | Attending: Internal Medicine | Admitting: Internal Medicine

## 2016-06-03 DIAGNOSIS — Z1231 Encounter for screening mammogram for malignant neoplasm of breast: Secondary | ICD-10-CM

## 2016-06-09 ENCOUNTER — Other Ambulatory Visit: Payer: Self-pay | Admitting: Orthopedic Surgery

## 2016-06-09 DIAGNOSIS — M5136 Other intervertebral disc degeneration, lumbar region: Secondary | ICD-10-CM

## 2016-06-21 ENCOUNTER — Ambulatory Visit
Admission: RE | Admit: 2016-06-21 | Discharge: 2016-06-21 | Disposition: A | Discharge status: Home / Self Care | Payer: Medicare Other | Source: Ambulatory Visit | Attending: Orthopedic Surgery | Admitting: Orthopedic Surgery

## 2016-06-21 DIAGNOSIS — M5136 Other intervertebral disc degeneration, lumbar region: Secondary | ICD-10-CM

## 2017-02-13 ENCOUNTER — Ambulatory Visit: Payer: Medicare Other | Admitting: Neurology

## 2017-02-14 ENCOUNTER — Encounter: Payer: Self-pay | Admitting: Neurology

## 2017-02-14 ENCOUNTER — Ambulatory Visit (INDEPENDENT_AMBULATORY_CARE_PROVIDER_SITE_OTHER): Payer: Medicare Other | Admitting: Neurology

## 2017-02-14 ENCOUNTER — Encounter (INDEPENDENT_AMBULATORY_CARE_PROVIDER_SITE_OTHER): Payer: Self-pay

## 2017-02-14 VITALS — BP 151/69 | HR 57 | Ht 65.0 in | Wt 151.2 lb

## 2017-02-14 DIAGNOSIS — G20C Parkinsonism, unspecified: Secondary | ICD-10-CM | POA: Insufficient documentation

## 2017-02-14 DIAGNOSIS — R269 Unspecified abnormalities of gait and mobility: Secondary | ICD-10-CM

## 2017-02-14 DIAGNOSIS — G20 Parkinson's disease: Secondary | ICD-10-CM | POA: Diagnosis not present

## 2017-02-14 NOTE — Patient Instructions (Signed)
Remember to drink plenty of fluid, eat healthy meals and do not skip any meals. Try to eat protein with a every meal and eat a healthy snack such as fruit or nuts in between meals. Try to keep a regular sleep-wake schedule and try to exercise daily, particularly in the form of walking, 20-30 minutes a day, if you can.   As far as diagnostic testing:   MRI brain DAT Scan  I would like to see you back after imaging, sooner if we need to. Please call us with any interim questions, concerns, problems, updates or refill requests.   Our phone number is [phone]. We also have an after hours call service for urgent matters and there is a physician on-call for urgent questions. For any emergencies you know to call 911 or go to the nearest emergency room

## 2017-02-14 NOTE — Progress Notes (Addendum)
[facility]    Provider:  Dr Jaynee Eagles Referring Provider: Prince Solian, MD Primary Care Physician:  Prince Solian, MD  CC:  Tremors  HPI:  Hailey [name] is a 76 y.o. female here as a referral from Dr. Dagmar Hait for a new problem Tremors. She was seen in the past in 2016 for dizziness which resolved an MRI of the brain was unremarkable. Here with sister in law. She feels tremulous. Brushing her teeth is difficult because her muscles dont want to work. Sister in law provides much information, everything is "slow motion", eating is slower, movements are slower overall, walking is slower but no falls. She started notciing symptoms more with BP medications a few years ago, symptoms are stable. She takes very low, speech has decreased in tone. Writing has changed, a little smaller and sloppy. No drooling or wet pillows. She says occasionally she stops when walking and has to get her legs going again and then her legs "unlock". No falls. No dizziness. No significant memory changes, age appropriate things per patient and sister-in-law. No Hallucinations or delusions. She has a resting tremor and action as well. No bvid dreams, but more trouble initiating sleep. She has toruble getting out of chairs but also has knee pain. Smell has worsened, decreased. She denies a resting tremor, her sister in law notices a resting tremor and action tremor. No difficulty with swallowing or choking on food.    Reviewed notes, labs and imaging from outside physicians, which showed:  Review primary care notes, patient has a left hand tremor but somewhat episodic sometimes at rest with inattention lasting 5-10 minutes. No falls. No swallowing difficulty some excess watering of the eyes. This is new, both at rest and with intention currently not symptomatic, also noted flat facies , slowed gait which may be attributable to arthritis and significant knee pain but no shuffling, no cogwheeling, evaluation for  atypical Parkinson's.   Labs include see him he which showed BUN 21, creatinine 0.9 otherwise largely unremarkable, CBC unremarkable labs were drawn 12/26/2016.  Review of Systems: Patient complains of symptoms per HPI as well as the following symptoms: no CP, no SOB. Pertinent negatives and positives per HPI. All others negative.   Social History   Social History  . Marital status: Single    Spouse name: N/A  . Number of children: 0  . Years of education: 12   Occupational History  . Not on file.   Social History Main Topics  . Smoking status: Former Smoker    Years: 10.00    Types: Cigarettes    Quit date: 12/06/1968  . Smokeless tobacco: Former Systems developer  . Alcohol use No  . Drug use: No  . Sexual activity: Not on file   Other Topics Concern  . Not on file   Social History Narrative   Lives at home by herself.   Right handed.   Caffeine use: 1 cup coffee per day and 2 glasses tea/day     Family History  Problem Relation Age of Onset  . Colon cancer Mother   . Heart Problems Mother   . Heart Problems Father   . Heart Problems Brother     Past Medical History:  Diagnosis Date  . 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 Cancer ([facility])    pre-ca, L side of face   . H/O exercise stress test    4-5 yrs. ago, told that it was wnl , done at [place] office   .  Hypertension   . Osteoporosis     Past Surgical History:  Procedure Laterality Date  . BREAST CYST EXCISION Left 1994   benign  . cataract surgery Bilateral 2007  . EYE SURGERY     /w IOL  . FOOT TUMOR EXCISION Left 1992   benign  . MOUTH SURGERY    . REVERSE SHOULDER ARTHROPLASTY Right 12/26/2013   Procedure: RIGHT SHOULDER REVERSE ARTHROPLASTY;  Surgeon: Marin Shutter, MD;  Location: [facility];  Service: Orthopedics;  Laterality: Right;    Current Outpatient Prescriptions  Medication Sig Dispense Refill  . aspirin EC 81 MG tablet Take 81 mg by mouth daily.    . Calcium Carbonate-Vitamin D (CALTRATE  600+D) 600-400 MG-UNIT per tablet Take 1 tablet by mouth daily.    . furosemide (LASIX) 20 MG tablet Take 20 mg by mouth daily as needed.    . metoprolol succinate (TOPROL-XL) 50 MG 24 hr tablet Take 50 mg by mouth daily.     . Vitamin D, Ergocalciferol, (DRISDOL) 50000 units CAPS capsule Take 50,000 Units by mouth 2 (two) times a week. Tuesdays and Fridays    . acetaminophen-codeine (TYLENOL #3) 300-30 MG tablet Take 1 tablet by mouth every 8 (eight) hours as needed.     Marland Kitchen amoxicillin (AMOXIL) 500 MG capsule Take 2,000 mg by mouth See admin instructions. Take 4 capsules (2000 mg) by mouth one hour prior to dental appointment     No current facility-administered medications for this visit.     Allergies as of 02/14/2017 - Review Complete 02/14/2017  Allergen Reaction Noted  . Forteo [teriparatide (recombinant)] Other (See Comments) 10/24/2012  . Other Rash 10/24/2012    Vitals: BP (!) 151/69   Pulse (!) 57   Ht 5\' 5"  (1.651 m)   Wt 151 lb 3.2 oz (68.6 kg)   BMI 25.16 kg/m  Last Weight:  Wt Readings from Last 1 Encounters:  02/14/17 151 lb 3.2 oz (68.6 kg)   Last Height:   Ht Readings from Last 1 Encounters:  02/14/17 5\' 5"  (1.651 m)   Physical exam: Exam: Gen: NAD, conversant, well nourised, well groomed                     CV: RRR, no MRG. No Carotid Bruits. + peripheral edema, warm, nontender Eyes: Conjunctivae clear without exudates or hemorrhage  Neuro: Detailed Neurologic Exam  Speech:    Speech is normal; fluent and spontaneous with normal comprehension.  Cognition:    The patient is oriented to person, place, and time;     recent and remote memory intact;     language fluent;     normal attention, concentration,     fund of knowledge Cranial Nerves: hypomimia    The pupils are equal, round, and reactive to light. Attempted fundoscopic exam could not visualize. Visual fields are full to finger confrontation. Extraocular movements are intact. Trigeminal sensation  is intact and the muscles of mastication are normal. The face is symmetric. The palate elevates in the midline. Hearing intact. Voice is normal. Shoulder shrug is normal. The tongue has normal motion without fasciculations.   Coordination:    No dysmetria. decreaed amplitude on finger tapping.  Gait:    Difficulty getting out of chair.  Decreased arm swing. Short narrowed strides but not shuffling  Motor Observation:    Slight Postural tremor. Tone:    Mild cogwheeling right arm enhanced with facilitation  Posture:    Posture is normal. normal  erect    Strength:  mild prox weakness otherwise strength is V/V in the upper and lower limbs.      Sensation: intact to LT     Reflex Exam:  DTR's:    Absent AJs   Toes:    The toes are downgoing bilaterally.   Clonus:    Clonus is absent.   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  Need MRI of the brain. If no etiology found in MRI brain, order a DAT scan. Home PT for gait, safety evaluation and treatment.  MRI brain then DAT scan Home PT  Orders Placed This Encounter  Procedures  . MR BRAIN W WO CONTRAST  . Ambulatory referral to Home Health     Cc: Dr. Reed Breech, MD  [facility] [address] [place], [place] [address]  Phone [phone] Fax [phone]

## 2017-03-01 ENCOUNTER — Ambulatory Visit
Admission: RE | Admit: 2017-03-01 | Discharge: 2017-03-01 | Disposition: A | Discharge status: Home / Self Care | Payer: Medicare Other | Source: Ambulatory Visit | Attending: Neurology | Admitting: Neurology

## 2017-03-01 DIAGNOSIS — G20 Parkinson's disease: Secondary | ICD-10-CM

## 2017-03-01 MED ORDER — GADOBENATE DIMEGLUMINE 529 MG/ML IV SOLN
15.0000 mL | Freq: Once | INTRAVENOUS | Status: AC | PRN
  Administered 2017-03-01: 14 mL via INTRAVENOUS

## 2017-03-02 ENCOUNTER — Telehealth: Payer: Self-pay | Admitting: Neurology

## 2017-03-02 ENCOUNTER — Other Ambulatory Visit: Payer: Self-pay | Admitting: Neurology

## 2017-03-02 DIAGNOSIS — G20 Parkinson's disease: Secondary | ICD-10-CM

## 2017-03-02 DIAGNOSIS — R29818 Other symptoms and signs involving the nervous system: Secondary | ICD-10-CM

## 2017-03-02 NOTE — Telephone Encounter (Signed)
Hailey [name], this is a patient with parkinsonism and I ordered a nuclear medicine scan looking for dopamine called a DAT scan. These are done at [facility] long now. fyi thanks

## 2017-03-03 ENCOUNTER — Telehealth: Payer: Self-pay | Admitting: Radiology

## 2017-03-03 NOTE — Telephone Encounter (Signed)
Called [facility] for approval for DAT Scan No approval was needed .  Reference # [id number]  [id number]

## 2017-03-03 NOTE — Telephone Encounter (Signed)
April is aware at [facility] for scheduling.    Called [facility] for approval for DAT Scan No approval was needed .  Reference # [id number]  [id number] [phone]

## 2017-03-07 ENCOUNTER — Encounter (HOSPITAL_COMMUNITY): Payer: Medicare Other

## 2017-03-07 ENCOUNTER — Telehealth: Payer: Self-pay

## 2017-03-07 NOTE — Telephone Encounter (Signed)
-----   Message from Melvenia Beam, MD sent at 03/02/2017  7:15 PM EDT ----- No significant change from MRI on 08/22/15, no reason for her new symptoms. As discussed in the appointment I am going to order a specialized exam called a DAT scan, info was provided to them at appt. thanks

## 2017-03-07 NOTE — Telephone Encounter (Signed)
Called pt w/ unchanged MRI results. She is agreeable to proceeding w/ DAT scan as previously discussed. Voiced appreciation for call.

## 2017-03-14 ENCOUNTER — Telehealth: Payer: Self-pay | Admitting: Neurology

## 2017-03-14 NOTE — Telephone Encounter (Signed)
Pt calling to inform that she is having a knee injection(gel on her knees) on 8-30 with her Orthopedic.  She wants to know if that will affect her scan scheduled  on 9-20, please call

## 2017-03-15 NOTE — Telephone Encounter (Signed)
Called and followed up with the patients concern and made her aware that this would not interfere with her having her DAT scan done on Sept 20. Pt verbalized understanding and had no further questions

## 2017-03-17 ENCOUNTER — Ambulatory Visit (INDEPENDENT_AMBULATORY_CARE_PROVIDER_SITE_OTHER): Payer: Medicare Other | Admitting: Podiatry

## 2017-03-17 ENCOUNTER — Encounter: Payer: Self-pay | Admitting: Podiatry

## 2017-03-17 VITALS — BP 160/83 | HR 58 | Resp 16

## 2017-03-17 DIAGNOSIS — L84 Corns and callosities: Secondary | ICD-10-CM | POA: Diagnosis not present

## 2017-03-17 DIAGNOSIS — M216X9 Other acquired deformities of unspecified foot: Secondary | ICD-10-CM

## 2017-03-17 NOTE — Progress Notes (Signed)
   Subjective:    Patient ID: Hailey [name], female    DOB: 22-Jun-1941, 76 y.o.   MRN: [id number]  HPI Chief Complaint  Patient presents with  . Painful lesion    Left foot; plantar forefoot-below 3rd toe; x6 months      Review of Systems  Cardiovascular: Positive for leg swelling.  Musculoskeletal: Positive for arthralgias and gait problem.  Hematological: Bruises/bleeds easily.  All other systems reviewed and are negative.      Objective:   Physical Exam        Assessment & Plan:

## 2017-03-17 NOTE — Progress Notes (Signed)
Subjective:    Patient ID: Hailey [name], female   DOB: 76 y.o.   MRN: [id number]   HPI patient presents stating she has a painful knot on the plantar aspect of her left foot that flares up at times and also makes her foot swell but the swelling goes away when the pain goes well    Review of Systems  All other systems reviewed and are negative.       Objective:  Physical Exam  Constitutional: She appears well-nourished.  Cardiovascular: Intact distal pulses.   Musculoskeletal: Normal range of motion.  Neurological: She is alert.  Skin: Skin is warm.  Nursing note and vitals reviewed.  neurovascular status was found to be intact muscle strength was adequate range of motion was within normal limits with patient found to have good digital perfusion well oriented 3. I noted there to be a very painful plantar callus sub-third metatarsal left with plantar flexion of the metatarsals exposed bone and diminished fat pad formation. 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 At this time I went ahead and utilizing sharp sterile his mentation I did deep debridement of lesion with no iatrogenic bleeding applied padding around it and discussed possible orthotics one point in future or other treatment depending on the response to this medication

## 2017-04-13 ENCOUNTER — Encounter (HOSPITAL_COMMUNITY)
Admission: RE | Admit: 2017-04-13 | Discharge: 2017-04-13 | Disposition: A | Discharge status: Home / Self Care | Payer: Medicare Other | Source: Ambulatory Visit | Attending: Neurology | Admitting: Neurology

## 2017-04-13 ENCOUNTER — Other Ambulatory Visit (HOSPITAL_COMMUNITY): Payer: Medicare Other

## 2017-04-13 ENCOUNTER — Encounter (HOSPITAL_COMMUNITY): Payer: Medicare Other

## 2017-04-13 DIAGNOSIS — R29818 Other symptoms and signs involving the nervous system: Secondary | ICD-10-CM | POA: Diagnosis present

## 2017-04-13 DIAGNOSIS — G20 Parkinson's disease: Secondary | ICD-10-CM | POA: Insufficient documentation

## 2017-04-13 MED ORDER — IOFLUPANE I 123 185 MBQ/2.5ML IV SOLN
4.6000 | Freq: Once | INTRAVENOUS | Status: AC
  Administered 2017-04-13: 4.6 via INTRAVENOUS

## 2017-04-13 MED ORDER — IODINE STRONG (LUGOLS) 5 % PO SOLN
0.8000 mL | Freq: Once | ORAL | Status: AC
  Administered 2017-04-13: 0.8 mL via ORAL

## 2017-04-16 ENCOUNTER — Telehealth: Payer: Self-pay | Admitting: Neurology

## 2017-04-16 NOTE — Telephone Encounter (Signed)
Spoke with patient about her positive DAT scan. Lovey Newcomer, can you squeeze her into my schedule a week or two earlier than her appt on 10/24? I can even do a 5pm thanks

## 2017-04-17 NOTE — Telephone Encounter (Signed)
Spoke to pt and appt made for 05-02-17 at 1630. Confirmed with pt.

## 2017-04-17 NOTE — Telephone Encounter (Signed)
LMVM for pt to return call about moving up her appt 1-2 wks from the 05-17-17.  (not 05-03-17)

## 2017-05-02 ENCOUNTER — Ambulatory Visit (INDEPENDENT_AMBULATORY_CARE_PROVIDER_SITE_OTHER): Payer: Medicare Other | Admitting: Neurology

## 2017-05-02 ENCOUNTER — Encounter: Payer: Self-pay | Admitting: Neurology

## 2017-05-02 DIAGNOSIS — G20 Parkinson's disease: Secondary | ICD-10-CM | POA: Diagnosis not present

## 2017-05-02 MED ORDER — CARBIDOPA-LEVODOPA 25-100 MG PO TABS: 1.0000 | ORAL_TABLET | Freq: Three times a day (TID) | ORAL | 6 refills | Status: DC

## 2017-05-02 NOTE — Patient Instructions (Signed)
Carbidopa/Levodopa: Start with 1/2 a pill 3x a day. Take medication 4 hours apart (example 8am, noon and 4pm). In a week can increase to a whole pill three times a day.  -Try to separate Sinemet from food (especially protein-rich foods like meat, dairy, eggs) by about 30-60 mins (take 30 minutes before food or 60 minutes after food) - this will help the absorption of the medication. If you have some nausea with the medication, you can take it with some light food like crackers or ginger ale    Parkinson Disease Parkinson disease is a long-term (chronic) condition that gets worse over time (is progressive). Parkinson disease limits your ability to control your movements and move your body normally. This condition is a type of movement disorder. Each person with Parkinson disease is affected differently. The condition can range from mild to severe. Parkinson disease tends to progress slowly over several years. What are the causes? Parkinson disease results from a loss of brain cells (neurons) in a specific part of the brain (substantia nigra). Some of the neurons in the substantia nigra make an important brain chemical (dopamine). Dopamine is needed to control movement. As the condition gets worse, neurons make less dopamine. This makes it hard to move or control your movements. The exact cause of why neurons are lost or produce less dopamine is not known. Genetic and environmental factors may contribute to the cause of Parkinson disease. What increases the risk? This condition is more likely to develop in:  Men.  People who are 60 years of age or older.  People who have a family history of Parkinson disease.  What are the signs or symptoms? Symptoms of this condition can vary from person to person. The main (primary) symptoms are related to movement (motor symptoms). These include:  Uncontrolled shaking movements (tremor). Tremors usually start in a hand or foot when you are resting (resting  tremor). The tremor may stop when you move around.  Slowing of movement. You may lose facial expression and have trouble making the small movements that are needed to button clothing or brush your teeth. You may walk with short, shuffling steps.  Stiff movement (rigidity). This mostly affects your arms, legs, neck, and upper body. You may walk without swinging your arms. Rigidity can be painful.  Loss of balance and stability when standing. You may sway, fall backward, and have trouble making turns.  Secondary motor symptoms of this condition include:  Shrinking handwriting.  Stooped posture.  Slowed speech.  Trouble swallowing.  Drooling.  Sexual dysfunction.  Muscle cramps.  Loss of smell.  Additional symptoms that are not related to movement include:  Constipation.  Mood swings.  Depression or anxiety.  Sleep disturbances.  Confusion.  Loss of mental abilities (dementia).  Low blood pressure.  Trouble concentrating.  How is this diagnosed? Parkinson disease can be hard to diagnose in its early stages. A diagnosis may be made based on symptoms, a medical history, and physical exam. During your exam, your health care provider will look for:  Lack of facial expression.  Resting tremor.  Stiffness in your neck, arms, and legs.  Abnormal walk.  Trouble with balance.  You may have brain imaging tests done to check for a loss of dopamine-producing areas of the brain. Your healthcare provider may also grade the severity of your condition as mild, moderate, or advanced. Parkinson disease progression is different for everyone. You may not progress to the advanced stage. Mild Parkinson disease involves:  Movement  problems that do not affect daily activities.  Movement problems on one side of the body.  Movement problems that are controlled with medicines.  Good response to exercise.  Moderate Parkinson disease involves:  Movement problems on both sides of  the body.  Slowing of movement.  Coordination and balance problems.  Less of a response to medicine.  More side effects from medicines.  Advanced Parkinson disease involves:  Extreme difficulty walking.  Inability to live alone safely.  Signs of dementia.  Difficulty controlling symptoms with medicine.  How is this treated? There is no cure for Parkinson disease. Treatment focuses on relieving your symptoms. Treatment may include:  Medicines.  Speech, occupational, and physical therapy.  Surgery.  Everyone responds to medicines differently. Your response may change over time. Work with your health care provider to find the best medicines for you. These may include:  Dopamine replacement drugs. These are the most effective medicines. A long-term side effect of these medicines is uncontrolled movements (dyskinesias).  Dopamine agonists. These drugs act like dopamine to stimulate dopamine receptors in the brain. Side effects include nausea and sleepiness, but they cause less dyskinesia.  Other medicines to reduce tremor, prevent dopamine breakdown, reduce dyskinesia, and reduce dementia that is related to Parkinson disease.  Another treatment is deep brain stimulation surgery to reduce tremors and dyskinesia. This procedure involves placing electrodes in the brain. The electrodes are attached to an electric pulse generator that acts like a pacemaker for your brain. This may be an option if you have had the condition for at least four years and are not responding well to medicines. Follow these instructions at home:  Take over-the-counter and prescription medicines only as told by your health care provider.  Install grab bars and railings in your home to prevent falls.  Follow instructions from your health care provider about eating or drinking restrictions.  Return to your normal activities as told by your health care provider. Ask your health care provider what activities  are safe for you.  Get regular exercise as told by your health care provider or a physical therapist.  Keep all follow-up visits as told by your health care provider. This is important. These include any visits with a speech therapist or occupational therapist.  Consider joining a support group for people with Parkinson disease. Contact a health care provider if:  Medicines do not help your symptoms.  You are unsteady or have fallen at home.  You need more support to function well at home.  You have trouble swallowing.  You have severe constipation.  You are struggling with side effects from your medicines.  You see or hear things that are not real (hallucinate).  You feel confused, anxious, or depressed. Get help right away if:  You are injured after a fall.  You cannot swallow without choking.  You have chest pain or trouble breathing.  You do not feel safe at home. This information is not intended to replace advice given to you by your health care provider. Make sure you discuss any questions you have with your health care provider. Document Released: 07/08/2000 Document Revised: 12/14/2015 Document Reviewed: 05/01/2015 Elsevier Interactive Patient Education  [employer].

## 2017-05-02 NOTE — Progress Notes (Signed)
[facility]    Provider:  Dr Jaynee Eagles Referring Provider: Prince Solian, MD Primary Care Physician:  Prince Solian, MD  CC:  Tremors  Interval history; DAT scan c/w Parkinsonian disorder. Discussed in detail with patient and husband. Discussed medication management.   HPI:  Hailey [name] is a 76 y.o. female here as a referral from Dr. Dagmar Hait for a new problem Tremors. She was seen in the past in 2016 for dizziness which resolved an MRI of the brain was unremarkable. Here with sister in law. She feels tremulous. Brushing her teeth is difficult because her muscles dont want to work. Sister in law provides much information, everything is "slow motion", eating is slower, movements are slower overall, walking is slower but no falls. She started notciing symptoms more with BP medications a few years ago, symptoms are stable. She takes very low, speech has decreased in tone. Writing has changed, a little smaller and sloppy. No drooling or wet pillows. She says occasionally she stops when walking and has to get her legs going again and then her legs "unlock". No falls. No dizziness. No significant memory changes, age appropriate things per patient and sister-in-law. No Hallucinations or delusions. She has a resting tremor and action as well. No bvid dreams, but more trouble initiating sleep. She has toruble getting out of chairs but also has knee pain. Smell has worsened, decreased. She denies a resting tremor, her sister in law notices a resting tremor and action tremor. No difficulty with swallowing or choking on food.    Reviewed notes, labs and imaging from outside physicians, which showed:  Review primary care notes, patient has a left hand tremor but somewhat episodic sometimes at rest with inattention lasting 5-10 minutes. No falls. No swallowing difficulty some excess watering of the eyes. This is new, both at rest and with intention currently not symptomatic, also noted  flat facies , slowed gait which may be attributable to arthritis and significant knee pain but no shuffling, no cogwheeling, evaluation for atypical Parkinson's.   Labs include see him he which showed BUN 21, creatinine 0.9 otherwise largely unremarkable, CBC unremarkable labs were drawn 12/26/2016.  Review of Systems: Patient complains of symptoms per HPI as well as the following symptoms: no CP, no SOB. Pertinent negatives and positives per HPI. All others negative.   Social History   Social History  . Marital status: Single    Spouse name: N/A  . Number of children: 0  . Years of education: 12   Occupational History  . Not on file.   Social History Main Topics  . Smoking status: Former Smoker    Years: 10.00    Types: Cigarettes    Quit date: 12/06/1968  . Smokeless tobacco: Former Systems developer  . Alcohol use No  . Drug use: No  . Sexual activity: Not on file   Other Topics Concern  . Not on file   Social History Narrative   Lives at home by herself.   Right handed.   Caffeine use: 1 cup coffee per day and 2 glasses tea/day     Family History  Problem Relation Age of Onset  . Colon cancer Mother   . Heart Problems Mother   . Heart Problems Father   . Heart Problems Brother     Past Medical History:  Diagnosis Date  . 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 Cancer (HCC)    pre-ca, L side of face   . H/O  exercise stress test    4-5 yrs. ago, told that it was wnl , done at [place] office   . Hypertension   . Osteoporosis     Past Surgical History:  Procedure Laterality Date  . BREAST CYST EXCISION Left 1994   benign  . cataract surgery Bilateral 2007  . EYE SURGERY     /w IOL  . FOOT TUMOR EXCISION Left 1992   benign  . MOUTH SURGERY    . REVERSE SHOULDER ARTHROPLASTY Right 12/26/2013   Procedure: RIGHT SHOULDER REVERSE ARTHROPLASTY;  Surgeon: Marin Shutter, MD;  Location: [facility];  Service: Orthopedics;  Laterality: Right;    Current Outpatient  Prescriptions  Medication Sig Dispense Refill  . amoxicillin (AMOXIL) 500 MG capsule Take 2,000 mg by mouth See admin instructions. Take 4 capsules (2000 mg) by mouth one hour prior to dental appointment    . aspirin EC 81 MG tablet Take 81 mg by mouth daily.    . Calcium Carbonate-Vitamin D (CALTRATE 600+D) 600-400 MG-UNIT per tablet Take 1 tablet by mouth daily.    . furosemide (LASIX) 20 MG tablet Take 20 mg by mouth daily as needed.    . metoprolol succinate (TOPROL-XL) 50 MG 24 hr tablet Take 50 mg by mouth daily.     . Vitamin D, Ergocalciferol, (DRISDOL) 50000 units CAPS capsule Take 50,000 Units by mouth 2 (two) times a week. Tuesdays and Fridays     No current facility-administered medications for this visit.     Allergies as of 05/02/2017 - Review Complete 05/02/2017  Allergen Reaction Noted  . Forteo [teriparatide (recombinant)] Other (See Comments) 10/24/2012  . Other Rash 10/24/2012    Vitals: BP (!) 153/69   Pulse 66   Ht 5\' 5"  (1.651 m)   Wt 156 lb 12.8 oz (71.1 kg)   BMI 26.09 kg/m  Last Weight:  Wt Readings from Last 1 Encounters:  05/02/17 156 lb 12.8 oz (71.1 kg)   Last Height:   Ht Readings from Last 1 Encounters:  05/02/17 5\' 5"  (1.651 m)    Speech:    Speech is normal; fluent and spontaneous with normal comprehension.  Cognition:    The patient is oriented to person, place, and time;     recent and remote memory intact;     language fluent;     normal attention, concentration,     fund of knowledge Cranial Nerves: hypomimia    The pupils are equal, round, and reactive to light. Attempted fundoscopic exam could not visualize. Visual fields are full to finger confrontation. Extraocular movements are intact. Trigeminal sensation is intact and the muscles of mastication are normal. The face is symmetric. The palate elevates in the midline. Hearing intact. Voice is normal. Shoulder shrug is normal. The tongue has normal motion without fasciculations.    Coordination:    No dysmetria. decreaed amplitude on finger tapping.  Gait:    Difficulty getting out of chair.  Decreased arm swing. Short narrowed strides but not shuffling  Motor Observation:    Slight Postural tremor. Tone:    Mild cogwheeling right arm enhanced with facilitation  Posture:    Posture is normal. normal erect    Strength:  mild prox weakness otherwise strength is V/V in the upper and lower limbs.      Sensation: intact to LT     Reflex Exam:  DTR's:    Absent AJs   Toes:    The toes are downgoing bilaterally.   Clonus:  Clonus is absent.   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  Need MRI of the brain. If no etiology found in MRI brain, order a DAT scan. Home PT for gait, safety evaluation and treatment.      Assessment/Plan:  76 year old patient with Parkinson's disease, +DAT scan.    Start medication as below. Provided information and written materials and recommendations for resources to family.   Carbidopa/Levodopa: Start with 1/2 a pill 3x a day. Take medication 4 hours apart (example 8am, noon and 4pm). In a week can increase to a whole pill three times a day.  -Try to separate Sinemet from food (especially protein-rich foods like meat, dairy, eggs) by about 30-60 mins (take 30 minutes before food or 60 minutes after food) - this will help the absorption of the medication. If you have some nausea with the medication, you can take it with some light food like crackers or ginger ale   Sarina Ill, MD  [facility] [address] [place], [place] [address]  Phone [phone] Fax [phone]  A total of 30 minutes was spent face-to-face with this patient. Over half this time was spent on counseling patient on the parkinson's disease diagnosis and different diagnostic and therapeutic options available.

## 2017-05-03 ENCOUNTER — Telehealth: Payer: Self-pay | Admitting: Neurology

## 2017-05-03 DIAGNOSIS — R269 Unspecified abnormalities of gait and mobility: Secondary | ICD-10-CM

## 2017-05-03 NOTE — Addendum Note (Signed)
Addended by: Gildardo Griffes on: 05/03/2017 04:27 PM   Modules accepted: Orders

## 2017-05-03 NOTE — Telephone Encounter (Signed)
Pt states that it has been weeks and she was told on yesterday that she would hear from someone about her [place].  Pt has not been contacted and is asking for a call back please

## 2017-05-08 ENCOUNTER — Telehealth: Payer: Self-pay

## 2017-05-08 NOTE — Telephone Encounter (Signed)
Tammy/[facility] [phone] called request VO for RN assessment to teach and train family on wound care and monitor signs and symptoms for infection. Cleanse with wound cleanser and non adherent dressing to be allied.  PT to eval and treat for gait and instability due to a fall.

## 2017-05-08 NOTE — Telephone Encounter (Signed)
PT from [facility] called for VO. He would like to see her twice a week for two weeks to address balance issues, safe steps, and fall prevention. Please call [phone].

## 2017-05-09 DIAGNOSIS — G20 Parkinson's disease: Secondary | ICD-10-CM | POA: Insufficient documentation

## 2017-05-09 DIAGNOSIS — G20A1 Parkinson's disease without dyskinesia, without mention of fluctuations: Secondary | ICD-10-CM | POA: Insufficient documentation

## 2017-05-09 NOTE — Telephone Encounter (Signed)
Tammy called and said you can call and give Verbal order at Number below she want's to go see patient today only if you can.

## 2017-05-09 NOTE — Telephone Encounter (Signed)
I spoke with Tammy and gave her verbal orders for PT eval due to a recent fall. They will also monitor the wound that she sustained after the fall and teach the sister in law signs and symptoms of infection.

## 2017-05-09 NOTE — Telephone Encounter (Signed)
I have already spoken with Tammy about these verbal orders.

## 2017-05-09 NOTE — Telephone Encounter (Signed)
Place orders please thanks

## 2017-05-11 ENCOUNTER — Other Ambulatory Visit: Payer: Self-pay | Admitting: Internal Medicine

## 2017-05-11 DIAGNOSIS — Z1231 Encounter for screening mammogram for malignant neoplasm of breast: Secondary | ICD-10-CM

## 2017-05-17 ENCOUNTER — Ambulatory Visit: Payer: Medicare Other | Admitting: Neurology

## 2017-05-17 NOTE — Telephone Encounter (Signed)
[name]/[facility] [phone] request VO for PT 2 x 2 gait, balance and home exercise program. Please call

## 2017-05-19 NOTE — Telephone Encounter (Signed)
Called back twice over 2 days trying to return [name]'s call. Left messages for her both times.

## 2017-05-22 NOTE — Telephone Encounter (Signed)
Tried to call [name] back. No answer.

## 2017-05-22 NOTE — Telephone Encounter (Signed)
[name] from [facility] has called and is asking that RN Romelle Starcher calls her back [id number] of PT plan of action.  [name] will be in office until 5pm

## 2017-05-22 NOTE — Telephone Encounter (Signed)
Called [facility] for the 3rd time and [name] was unavailable. I asked for the staff to leave her a message for a return call.

## 2017-05-23 NOTE — Telephone Encounter (Signed)
[facility] and spoke with Hailey [name]. Confirmed that they have the verbal order for PT 2 x 2. No further concerns reported.

## 2017-06-06 ENCOUNTER — Ambulatory Visit
Admission: RE | Admit: 2017-06-06 | Discharge: 2017-06-06 | Disposition: A | Discharge status: Home / Self Care | Payer: Medicare Other | Source: Ambulatory Visit | Attending: Internal Medicine | Admitting: Internal Medicine

## 2017-06-06 ENCOUNTER — Other Ambulatory Visit: Payer: Self-pay | Admitting: Internal Medicine

## 2017-06-06 DIAGNOSIS — Z1231 Encounter for screening mammogram for malignant neoplasm of breast: Secondary | ICD-10-CM

## 2017-06-06 NOTE — Telephone Encounter (Signed)
Faxed the signed PT order & Plan of care paperwork to [facility]. Received a receipt of confirmation.

## 2017-07-10 ENCOUNTER — Other Ambulatory Visit: Payer: Self-pay

## 2017-07-10 ENCOUNTER — Emergency Department (HOSPITAL_COMMUNITY)
Admission: EM | Admit: 2017-07-10 | Discharge: 2017-07-10 | Disposition: A | Discharge status: Home / Self Care | Payer: Medicare Other | Attending: Emergency Medicine | Admitting: Emergency Medicine

## 2017-07-10 ENCOUNTER — Encounter (HOSPITAL_COMMUNITY): Payer: Self-pay | Admitting: Emergency Medicine

## 2017-07-10 ENCOUNTER — Emergency Department (HOSPITAL_COMMUNITY): Payer: Medicare Other

## 2017-07-10 DIAGNOSIS — Z79899 Other long term (current) drug therapy: Secondary | ICD-10-CM | POA: Insufficient documentation

## 2017-07-10 DIAGNOSIS — Z87891 Personal history of nicotine dependence: Secondary | ICD-10-CM | POA: Diagnosis not present

## 2017-07-10 DIAGNOSIS — I10 Essential (primary) hypertension: Secondary | ICD-10-CM | POA: Diagnosis not present

## 2017-07-10 DIAGNOSIS — Z7982 Long term (current) use of aspirin: Secondary | ICD-10-CM | POA: Diagnosis not present

## 2017-07-10 DIAGNOSIS — G20 Parkinson's disease: Secondary | ICD-10-CM | POA: Diagnosis not present

## 2017-07-10 DIAGNOSIS — R0789 Other chest pain: Secondary | ICD-10-CM

## 2017-07-10 HISTORY — DX: Parkinson's disease without dyskinesia, without mention of fluctuations: G20.A1

## 2017-07-10 HISTORY — DX: Parkinson's disease: G20

## 2017-07-10 LAB — HEPATIC FUNCTION PANEL
ALBUMIN: 3.4 g/dL — AB (ref 3.5–5.0)
ALK PHOS: 69 U/L (ref 38–126)
AST: 19 U/L (ref 15–41)
BILIRUBIN DIRECT: 0.1 mg/dL (ref 0.1–0.5)
BILIRUBIN TOTAL: 0.6 mg/dL (ref 0.3–1.2)
Indirect Bilirubin: 0.5 mg/dL (ref 0.3–0.9)
Total Protein: 6.4 g/dL — ABNORMAL LOW (ref 6.5–8.1)

## 2017-07-10 LAB — BASIC METABOLIC PANEL
Anion gap: 9 (ref 5–15)
BUN: 22 mg/dL — AB (ref 6–20)
CO2: 24 mmol/L (ref 22–32)
Calcium: 9.5 mg/dL (ref 8.9–10.3)
Chloride: 105 mmol/L (ref 101–111)
Creatinine, Ser: 0.88 mg/dL (ref 0.44–1.00)
GFR calc Af Amer: >60 mL/min (ref >60)
GLUCOSE: 108 mg/dL — AB (ref 65–99)
POTASSIUM: 4.3 mmol/L (ref 3.5–5.1)
Sodium: 138 mmol/L (ref 135–145)

## 2017-07-10 LAB — D-DIMER, QUANTITATIVE: D-Dimer, Quant: 0.77 ug/mL-FEU — ABNORMAL HIGH (ref 0.00–0.50)

## 2017-07-10 LAB — I-STAT TROPONIN, ED: Troponin i, poc: 0 ng/mL (ref 0.00–0.08)

## 2017-07-10 LAB — CBC
HEMATOCRIT: 37.9 % (ref 36.0–46.0)
HEMOGLOBIN: 12.2 g/dL (ref 12.0–15.0)
MCH: 30 pg (ref 26.0–34.0)
MCHC: 32.2 g/dL (ref 30.0–36.0)
MCV: 93.3 fL (ref 78.0–100.0)
Platelets: 266 10*3/uL (ref 150–400)
RBC: 4.06 MIL/uL (ref 3.87–5.11)
RDW: 13.6 % (ref 11.5–15.5)
WBC: 7.3 10*3/uL (ref 4.0–10.5)

## 2017-07-10 LAB — LIPASE, BLOOD: Lipase: 39 U/L (ref 11–51)

## 2017-07-10 MED ORDER — MORPHINE SULFATE (PF) 4 MG/ML IV SOLN
4.0000 mg | Freq: Once | INTRAVENOUS | Status: AC
  Administered 2017-07-10: 4 mg via INTRAVENOUS
  Filled 2017-07-10: qty 1

## 2017-07-10 MED ORDER — OXYCODONE-ACETAMINOPHEN 5-325 MG PO TABS: 1.0000 | ORAL_TABLET | Freq: Four times a day (QID) | ORAL | 0 refills | Status: AC | PRN

## 2017-07-10 MED ORDER — IOPAMIDOL (ISOVUE-370) INJECTION 76%
INTRAVENOUS | Status: AC
  Administered 2017-07-10: 100 mL
  Filled 2017-07-10: qty 100

## 2017-07-10 MED ORDER — ACETAMINOPHEN 325 MG PO TABS
650.0000 mg | ORAL_TABLET | Freq: Once | ORAL | Status: AC
  Administered 2017-07-10: 650 mg via ORAL
  Filled 2017-07-10: qty 2

## 2017-07-10 MED ORDER — GI COCKTAIL ~~LOC~~
30.0000 mL | Freq: Once | ORAL | Status: AC
  Administered 2017-07-10: 30 mL via ORAL
  Filled 2017-07-10: qty 30

## 2017-07-10 MED ORDER — LIDOCAINE 5 % EX PTCH
1.0000 | MEDICATED_PATCH | CUTANEOUS | Status: DC
  Administered 2017-07-10: 1 via TRANSDERMAL
  Filled 2017-07-10: qty 1

## 2017-07-10 MED ORDER — ONDANSETRON HCL 4 MG/2ML IJ SOLN
4.0000 mg | Freq: Once | INTRAMUSCULAR | Status: AC
  Administered 2017-07-10: 4 mg via INTRAVENOUS
  Filled 2017-07-10: qty 2

## 2017-07-10 MED ORDER — KETOROLAC TROMETHAMINE 15 MG/ML IJ SOLN
15.0000 mg | Freq: Once | INTRAMUSCULAR | Status: AC
  Administered 2017-07-10: 15 mg via INTRAVENOUS
  Filled 2017-07-10: qty 1

## 2017-07-10 MED ORDER — LIDOCAINE 5 % EX PTCH: 1.0000 | MEDICATED_PATCH | CUTANEOUS | 0 refills | Status: DC

## 2017-07-10 NOTE — ED Notes (Signed)
Pt able to get up to the wheelchair from the bed with minimal assist.

## 2017-07-10 NOTE — Discharge Instructions (Signed)
Thank you for allowing us to provide your care. Please follow-up with your primary care doctor as soon as possible.  °

## 2017-07-10 NOTE — ED Triage Notes (Signed)
Pt reports central CP since yesterday, describes as "stabbing:", denies radiation, n/v, SOB, fatigue.  EMS reports pt took 324 ASA PTA, report giving 100 mcg Fentanyl.  Pt reports pain worse with movement, reproducible with palpation.  Pt guarded with movements, rates pain 3/10.

## 2017-07-10 NOTE — ED Provider Notes (Signed)
[facility] EMERGENCY DEPARTMENT Provider Note   CSN: [id number] Arrival date & time: 07/10/17  0840  History   Chief Complaint Chief Complaint  Patient presents with  . Chest Pain   HPI Hailey [name] is a 76 y.o. female with hypertension and osteoporosis who presented to the ED with stabbing non-radiating mid-sternal chest pain of 1-2 days durations. She states that yesterday she was getting dressed when she began to experience excruciating mid-sternal chest pain. Nothing seems to alleviate the pain and it was made worse with movement. It eventually subsided however recurred throughout the day. This AM she was getting dressed and had recurrence of the pain, prompting her to seek further medical evaluation. In EMS she received 100 mcg of Fentanyl with complete resolution of her pain; however it is now back. She does have a significant family history for CAD in her father (68s), mother (44s), and brother.   She endorse pain with deep inspiration. She denies recent illness, cough, rhinorrhea, headache, SOB, diaphoresis, N/V, abdominal pain, diarrhea, constipation, dysuria, polyuria, new rash.   Past Medical History:  Diagnosis Date  . 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 H/O exercise stress test    4-5 yrs. ago, told that it was wnl , done at [place] office   . Hypertension   . Osteoporosis   . Parkinson disease [facility])    Patient Active Problem List   Diagnosis Date Noted  . Parkinson's disease ([place]) 05/09/2017  . Parkinsonism ([place]) 02/14/2017  . BPPV (benign paroxysmal positional vertigo) 11/10/2014  . 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 BREAST CYST EXCISION Left 1994   benign  . cataract surgery Bilateral 2007  . EYE SURGERY     /w IOL  . FOOT TUMOR EXCISION Left 1992   benign  . MOUTH SURGERY    . REVERSE SHOULDER ARTHROPLASTY Right 12/26/2013   Procedure: RIGHT SHOULDER REVERSE ARTHROPLASTY;   Surgeon: Marin Shutter, MD;  Location: [place];  Service: Orthopedics;  Laterality: Right;   OB History    No data available     Home Medications    Prior to Admission medications   Medication Sig Start Date End Date Taking? Authorizing Provider  acetaminophen (TYLENOL) 650 MG CR tablet Take 1,300 mg by mouth every 8 (eight) hours as needed for pain.   Yes [provider]  aspirin EC 81 MG tablet Take 81 mg by mouth daily.   Yes [provider]  Calcium Carbonate-Vitamin D (CALTRATE 600+D) 600-400 MG-UNIT per tablet Take 1 tablet by mouth daily.   Yes [provider]  carbidopa-levodopa (SINEMET IR) 25-100 MG tablet Take 1 tablet by mouth 3 (three) times daily. Take 4 hours apart. Do not eat with protein-rich foods. 05/02/17  Yes Melvenia Beam, MD  metoprolol succinate (TOPROL-XL) 50 MG 24 hr tablet Take 50 mg by mouth daily as needed (Edema).  01/23/17  Yes [provider]  Vitamin D, Ergocalciferol, (DRISDOL) 50000 units CAPS capsule Take 50,000 Units by mouth 2 (two) times a week. Tuesdays and Fridays   Yes [provider]  amoxicillin (AMOXIL) 500 MG capsule Take 2,000 mg by mouth See admin instructions. Take 4 capsules (2000 mg) by mouth one hour prior to dental appointment 07/31/15   [provider]  furosemide (LASIX) 20 MG tablet Take 20 mg by mouth daily as needed for fluid or edema.     [provider]  lidocaine (LIDODERM) 5 % Place 1 patch onto the skin daily. Remove & Discard patch within 12 hours or as directed by MD 07/10/17   Ina Homes, MD  oxyCODONE-acetaminophen (PERCOCET/ROXICET) 5-325 MG tablet Take 1-2 tablets by mouth every 6 (six) hours as needed for up to 5 days for moderate pain or severe pain. 07/10/17 07/15/17  Ina Homes, MD   Family History Family History  Problem Relation Age of Onset  . Colon cancer Mother   . Heart Problems Mother   . Heart Problems Father   . Heart Problems Brother   .  Breast cancer Cousin   . Breast cancer Cousin   . Breast cancer Cousin   . Breast cancer Cousin   . Breast cancer Cousin    Social History Social History   Tobacco Use  . Smoking status: Former Smoker    Years: 10.00    Types: Cigarettes    Last attempt to quit: 12/06/1968    Years since quitting: 48.6  . Smokeless tobacco: Former Network engineer Use Topics  . Alcohol use: No    Alcohol/week: 0.0 oz  . Drug use: No   Allergies   Forteo [teriparatide (recombinant)] and Other  Review of Systems All systems reviewed and are negative for acute change except as noted in the HPI.  Physical Exam Updated Vital Signs BP 134/60   Pulse 65   Temp 98.5 F (36.9 C) (Oral)   Resp 13   Ht 5\' 5"  (1.651 m)   Wt 68 kg (150 lb)   SpO2 95%   BMI 24.96 kg/m   General: Thin female in acute distress HENT: Normocephalic, atraumatic, moist mucus membranes Pulm: Good air movement with no wheezing or crackles  CV: RRR, no murmurs, no rubs, extreme tenderness to light touch and palpation of the chest wall. This pain is the same as she has been experiencing.  Abdomen: Active bowel sounds, soft, tenderness to palpation of the epigastric and RUQ, non-distended Extremities: Mild pitting edema bilaterally, radial pulses palpable bilaterally  Skin: Warm and dry, no new rash noted on the thoracic wall  Neuro: Alert and oriented x 3  EKG: Sinus rhythm with normal axis and intervals, No ST elevation or new T wave inversion.   ED Treatments / Results  Labs (all labs ordered are listed, but only abnormal results are displayed) Labs Reviewed  BASIC METABOLIC PANEL - Abnormal; Notable for the following components:      Result Value   Glucose, Bld 108 (*)    BUN 22 (*)    All other components within normal limits  D-DIMER, QUANTITATIVE (NOT AT [facility]) - Abnormal; Notable for the following components:   D-Dimer, Quant 0.77 (*)    All other components within normal limits  HEPATIC FUNCTION PANEL -  Abnormal; Notable for the following components:   Total Protein 6.4 (*)    Albumin 3.4 (*)    ALT <5 (*)    All other components within normal limits  CBC  LIPASE, BLOOD  I-STAT TROPONIN, ED   EKG  EKG Interpretation  Date/Time:  Monday July 10 2017 08:36:09 EST Ventricular Rate:  64 PR Interval:  170 QRS Duration: 70 QT Interval:  408 QTC Calculation: 420 R Axis:   37 Text Interpretation:  Normal sinus rhythm Normal ECG No significant change since last tracing Confirmed by Blanchie Dessert [phone]) on 07/10/2017 9:52:06 AM      Radiology Dg Chest 2 View  Result Date: 07/10/2017 CLINICAL DATA:  Severe midline chest pain began yesterday morning with no associated symptoms. Remote history of smoking. History of Parkinson's disease. EXAM: CHEST  2 VIEW COMPARISON:  None in PACs FINDINGS: The lungs are adequately inflated. There is patchy increased density in the lingula. There is no pleural effusion. The heart and pulmonary vascularity are normal. There is calcification in the wall of the aortic arch. The mediastinum is normal in width. The bony thorax exhibits no acute abnormality. There is a prosthetic right shoulder joint. IMPRESSION: Subsegmental atelectasis or early pneumonia in the lingula. No CHF or pleural effusion. Followup PA and lateral chest X-ray is recommended in 3-4 weeks following trial of antibiotic therapy to ensure resolution and exclude underlying malignancy. Thoracic aortic atherosclerosis. Electronically Signed   By: David  Martinique M.D.   On: 07/10/2017 09:40   Ct Angio Chest/abd/pel For Dissection W And/or W/wo  Result Date: 07/10/2017 CLINICAL DATA:  Chest pain EXAM: CT ANGIOGRAPHY CHEST, ABDOMEN AND PELVIS TECHNIQUE: Multidetector CT imaging through the chest, abdomen and pelvis was performed using the standard protocol during bolus administration of intravenous contrast. Multiplanar reconstructed images and MIPs were obtained and reviewed to evaluate the  vascular anatomy. CONTRAST:  146mL ISOVUE-370 IOPAMIDOL (ISOVUE-370) INJECTION 76% COMPARISON:  None. FINDINGS: CTA CHEST FINDINGS Cardiovascular: Heart size upper normal. No pericardial effusion. No dense crescent in the wall of the thoracic aorta to suggest acute intramural hematoma. No dissection of the thoracic aorta. No thoracic aortic aneurysm. Bovine arch vessel anatomy noted incidentally. Patient also noted to have an anomalous right superior pulmonary vein draining directly into the posterior roof of the left atrium. No large central pulmonary embolus. Mediastinum/Nodes: No mediastinal lymphadenopathy. There is no hilar lymphadenopathy. The esophagus has normal imaging features. There is no axillary lymphadenopathy. Lungs/Pleura: Linear scarring noted right apex. 2.3 x 0.9 cm opacities identified along the superior aspect of the right major fissure. Mosaic ground-glass attenuation is noted in the lungs bilaterally. Subsegmental atelectasis evident in the lower lobes bilaterally. No pulmonary edema down Musculoskeletal: Old left nondisplaced rib fractures evident. Review of the MIP images confirms the above findings. CTA ABDOMEN AND PELVIS FINDINGS VASCULAR Aorta: No aneurysm. Minimal atherosclerotic plaque in the wall of the abdominal aorta. Celiac: Widely patent.  Normal major branch vessel anatomy. SMA: Widely patent. Renals: Widely patent main renal arteries bilaterally. Accessory renal artery noted to the lower pole of the left kidney. IMA: Patent. Inflow: Atherosclerosis without aneurysm. Veins: Not well evaluated given bolus timing. Review of the MIP images confirms the above findings. NON-VASCULAR Hepatobiliary: No focal abnormality within the liver parenchyma. There is no evidence for gallstones, gallbladder wall thickening, or pericholecystic fluid. No intrahepatic or extrahepatic biliary dilation. Pancreas: Main pancreatic duct dilated to 7 mm. Spleen: No splenomegaly. No focal mass lesion.  Adrenals/Urinary Tract: Thickening of the adrenal glands noted bilaterally without mass lesion evident. No focal abnormality evident in either kidney. No hydronephrosis no evidence for hydroureter. The urinary bladder appears normal for the degree of distention. Stomach/Bowel: Stomach is nondistended. No gastric wall thickening. No evidence of outlet obstruction. Duodenum is normally positioned as is the ligament of Treitz. No small bowel wall thickening. No small bowel dilatation. The terminal ileum is normal. The appendix is normal. Diverticular changes are noted in the left colon without evidence of diverticulitis. Lymphatic: There is no gastrohepatic or hepatoduodenal ligament lymphadenopathy. No intraperitoneal or retroperitoneal lymphadenopathy. No pelvic sidewall lymphadenopathy. Reproductive: There is no adnexal mass. Other: No intraperitoneal free fluid. Musculoskeletal: Bone windows reveal no worrisome lytic  or sclerotic osseous lesions. Review of the MIP images confirms the above findings. IMPRESSION: 1. No thoracoabdominal aortic aneurysm. No dissection or occlusion of the thoracoabdominal aorta. 2. Main pancreatic duct dilated up to 7 mm. Pancreatic parenchyma not well evaluated on this early arterial phase study. Dedicated abdominal MRI without and with contrast recommended to further evaluate as pancreatic neoplasm could present with this appearance. Follow-up MRI should be performed after resolution of acute symptoms so the patient is best able to cooperate with positioning and breath holding. 3. 2.3 x 0.9 cm opacity in the posterior right upper lobe along the major fissure. This has a somewhat platelike configuration and may be atelectasis or scar. Follow-up CT chest without contrast recommended in 3 months to ensure stability. 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 5.  Aortic Atherosclerois (ICD10-170.0) Electronically Signed   By: Misty Stanley M.D.   On: 07/10/2017 12:57   Procedures Procedures (including critical care time)  Medications Ordered in ED Medications  lidocaine (LIDODERM) 5 % 1 patch (1 patch Transdermal Patch Applied 07/10/17 1407)  ketorolac (TORADOL) 15 MG/ML injection 15 mg (15 mg Intravenous Given 07/10/17 1029)  morphine 4 MG/ML injection 4 mg (4 mg Intravenous Given 07/10/17 1128)  ondansetron (ZOFRAN) injection 4 mg (4 mg Intravenous Given 07/10/17 1128)  iopamidol (ISOVUE-370) 76 % injection (100 mLs  Contrast Given 07/10/17 1216)  morphine 4 MG/ML injection 4 mg (4 mg Intravenous Given 07/10/17 1408)  acetaminophen (TYLENOL) tablet 650 mg (650 mg Oral Given 07/10/17 1404)  gi cocktail (Maalox,Lidocaine,Donnatal) (30 mLs Oral Given 07/10/17 1457)   Initial Impression / Assessment and Plan / ED Course  I have reviewed the triage vital signs and the nursing notes.  Pertinent labs & imaging results that were available during my care of the patient were reviewed by me and considered in my medical decision making (see chart for details).    76 y.o female with hypertension and osteoporosis who presented to the ED with stabbing non-radiating mid-sternal chest pain of 1-2 days durations. No associated symptoms. Physical exam is significant for tenderness to palpation of the chest wall, epigastric regions, and RUQ. Labs and imaging have been reassuring. Based on available clinical information the following diagnoses are less likely: ACS, myocarditis, pericarditis, PE, pneumothorax, aortic dissection, PNA, biliary colic, GERD/PUD, pancreatitis, hepatitis. The most likely etiology of her pain at this point is musculoskeletal   Her pain was very difficult to control. We treated acutely with Morphine and a Lidocaine patch with improvement of her symptoms.We discussed the treatment plan and that she will need to follow-up with her PCP as soon as possible. She agrees. All questions and concerns addressed. Standard  return precautions given. She is hemodynamically stable for discharge.   PCP follow-up: - CTA illustrated pancreatic duct dilation. Recommend dedicated MRI once pain improved.  - 2.3 x 0.9 cm opacity in the posterior right upper lobe along the major fissure. Repeat CT without contrast in 3 months   Final Clinical Impressions(s) / ED Diagnoses   Final diagnoses:  Chest wall pain   ED Discharge Orders        Ordered    lidocaine (LIDODERM) 5 %  Every 24 hours     07/10/17 1524    oxyCODONE-acetaminophen (PERCOCET/ROXICET) 5-325 MG tablet  Every 6 hours PRN     07/10/17 1524       Ina Homes, MD 07/10/17 1524    Blanchie Dessert, MD 07/10/17 2227

## 2017-08-04 ENCOUNTER — Other Ambulatory Visit: Payer: Self-pay | Admitting: Internal Medicine

## 2017-08-04 DIAGNOSIS — R9389 Abnormal findings on diagnostic imaging of other specified body structures: Secondary | ICD-10-CM

## 2017-08-05 ENCOUNTER — Other Ambulatory Visit: Payer: Self-pay | Admitting: Internal Medicine

## 2017-08-05 DIAGNOSIS — R935 Abnormal findings on diagnostic imaging of other abdominal regions, including retroperitoneum: Secondary | ICD-10-CM

## 2017-08-22 ENCOUNTER — Other Ambulatory Visit: Payer: Medicare Other

## 2017-08-24 ENCOUNTER — Ambulatory Visit
Admission: RE | Admit: 2017-08-24 | Discharge: 2017-08-24 | Disposition: A | Discharge status: Home / Self Care | Payer: Medicare Other | Source: Ambulatory Visit | Attending: Internal Medicine | Admitting: Internal Medicine

## 2017-08-24 DIAGNOSIS — R935 Abnormal findings on diagnostic imaging of other abdominal regions, including retroperitoneum: Secondary | ICD-10-CM

## 2017-08-24 MED ORDER — GADOBENATE DIMEGLUMINE 529 MG/ML IV SOLN
14.0000 mL | Freq: Once | INTRAVENOUS | Status: AC | PRN
  Administered 2017-08-24: 14 mL via INTRAVENOUS

## 2017-09-04 ENCOUNTER — Encounter: Payer: Self-pay | Admitting: Neurology

## 2017-09-04 ENCOUNTER — Ambulatory Visit: Payer: Medicare Other | Admitting: Neurology

## 2017-09-04 VITALS — BP 151/74 | HR 60 | Ht 65.0 in | Wt 154.2 lb

## 2017-09-04 DIAGNOSIS — G20 Parkinson's disease: Secondary | ICD-10-CM

## 2017-09-04 NOTE — Progress Notes (Signed)
[facility]    Provider:  Dr Jaynee Eagles Referring Provider: Prince Solian, MD Primary Care Physician:  Prince Solian, MD  CC:  Tremors  Interval history; DAT scan c/w Parkinsonian disorder. Discussed in detail with patient and husband. Discussed medication management.  Recommended exercise classes. No falls in the last few weeks. Answered all questions. Discussed parkinson's disease, treatment, they had multiple questions about Sinemet. Make sure she gets skin checks to eval for skin malignancies. Discussed association between PD and melanoma.   HPI:  Hailey [name] is a 77 y.o. female here as a referral from Dr. Dagmar Hait for a new problem Tremors. She was seen in the past in 2016 for dizziness which resolved an MRI of the brain was unremarkable. Here with sister in law. She feels tremulous. Brushing her teeth is difficult because her muscles dont want to work. Sister in law provides much information, everything is "slow motion", eating is slower, movements are slower overall, walking is slower but no falls. She started notciing symptoms more with BP medications a few years ago, symptoms are stable. She takes very low, speech has decreased in tone. Writing has changed, a little smaller and sloppy. No drooling or wet pillows. She says occasionally she stops when walking and has to get her legs going again and then her legs "unlock". No falls. No dizziness. No significant memory changes, age appropriate things per patient and sister-in-law. No Hallucinations or delusions. She has a resting tremor and action as well. No bvid dreams, but more trouble initiating sleep. She has toruble getting out of chairs but also has knee pain. Smell has worsened, decreased. She denies a resting tremor, her sister in law notices a resting tremor and action tremor. No difficulty with swallowing or choking on food.    Reviewed notes, labs and imaging from outside physicians, which showed:  Review  primary care notes, patient has a left hand tremor but somewhat episodic sometimes at rest with inattention lasting 5-10 minutes. No falls. No swallowing difficulty some excess watering of the eyes. This is new, both at rest and with intention currently not symptomatic, also noted flat facies , slowed gait which may be attributable to arthritis and significant knee pain but no shuffling, no cogwheeling, evaluation for atypical Parkinson's.   Labs include see him he which showed BUN 21, creatinine 0.9 otherwise largely unremarkable, CBC unremarkable labs were drawn 12/26/2016.  Review of Systems: Patient complains of symptoms per HPI as well as the following symptoms: no CP, no SOB. Pertinent negatives and positives per HPI. All others negative.   Social History   Socioeconomic History  . Marital status: Single    Spouse name: Not on file  . Number of children: 0  . Years of education: 75  . Highest education level: Not on file  Social Needs  . Financial resource strain: Not on file  . Food insecurity - worry: Not on file  . Food insecurity - inability: Not on file  . Transportation needs - medical: Not on file  . Transportation needs - non-medical: Not on file  Occupational History  . Not on file  Tobacco Use  . Smoking status: Former Smoker    Years: 10.00    Types: Cigarettes    Last attempt to quit: 12/06/1968    Years since quitting: 48.7  . Smokeless tobacco: Former Network engineer and Sexual Activity  . Alcohol use: No    Alcohol/week: 0.0 oz  . Drug use: No  . Sexual activity:  Not on file  Other Topics Concern  . Not on file  Social History Narrative   Lives at home by herself.   Right handed.   Caffeine use: 1 cup coffee per day and 2 glasses tea/day     Family History  Problem Relation Age of Onset  . Colon cancer Mother   . Heart Problems Mother   . Heart Problems Father   . Heart Problems Brother   . Breast cancer Cousin   . Breast cancer Cousin   .  Breast cancer Cousin   . Breast cancer Cousin   . Breast cancer Cousin     Past Medical History:  Diagnosis Date  . 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 H/O exercise stress test    4-5 yrs. ago, told that it was wnl , done at [place] office   . Hypertension   . Osteoporosis   . Parkinson disease [facility])     Past Surgical History:  Procedure Laterality Date  . BREAST CYST EXCISION Left 1994   benign  . cataract surgery Bilateral 2007  . EYE SURGERY     /w IOL  . FOOT TUMOR EXCISION Left 1992   benign  . MOUTH SURGERY    . REVERSE SHOULDER ARTHROPLASTY Right 12/26/2013   Procedure: RIGHT SHOULDER REVERSE ARTHROPLASTY;  Surgeon: Marin Shutter, MD;  Location: [place];  Service: Orthopedics;  Laterality: Right;    Current Outpatient Medications  Medication Sig Dispense Refill  . acetaminophen (TYLENOL) 650 MG CR tablet Take 1,300 mg by mouth every 8 (eight) hours as needed for pain.    Marland Kitchen amoxicillin (AMOXIL) 500 MG capsule Take 2,000 mg by mouth See admin instructions. Take 4 capsules (2000 mg) by mouth one hour prior to dental appointment    . aspirin EC 81 MG tablet Take 81 mg by mouth daily.    . Calcium Carbonate-Vitamin D (CALTRATE 600+D) 600-400 MG-UNIT per tablet Take 1 tablet by mouth daily.    . carbidopa-levodopa (SINEMET IR) 25-100 MG tablet Take 1 tablet by mouth 3 (three) times daily. Take 4 hours apart. Do not eat with protein-rich foods. 90 tablet 6  . furosemide (LASIX) 20 MG tablet Take 20 mg by mouth daily as needed for fluid or edema.     . metoprolol succinate (TOPROL-XL) 50 MG 24 hr tablet Take 50 mg by mouth daily as needed (Edema).     . Vitamin D, Ergocalciferol, (DRISDOL) 50000 units CAPS capsule Take 50,000 Units by mouth 2 (two) times a week. Tuesdays and Fridays     No current facility-administered medications for this visit.     Allergies as of 09/04/2017 - Review Complete 09/04/2017  Allergen Reaction Noted  . Forteo [teriparatide (recombinant)]  Other (See Comments) 10/24/2012  . Other Rash 10/24/2012    Vitals: BP (!) 151/74   Pulse 60   Ht 5\' 5"  (1.651 m)   Wt 154 lb 3.2 oz (69.9 kg)   BMI 25.66 kg/m  Last Weight:  Wt Readings from Last 1 Encounters:  09/04/17 154 lb 3.2 oz (69.9 kg)   Last Height:   Ht Readings from Last 1 Encounters:  09/04/17 5\' 5"  (1.651 m)    Speech:    Speech is normal; fluent and spontaneous with normal comprehension.  Cognition:    The patient is oriented to person, place, and time;     recent and remote memory intact;     language fluent;  normal attention, concentration,     fund of knowledge Cranial Nerves: hypomimia    The pupils are equal, round, and reactive to light. Attempted fundoscopic exam could not visualize. Visual fields are full to finger confrontation. Extraocular movements are intact. Trigeminal sensation is intact and the muscles of mastication are normal. The face is symmetric. The palate elevates in the midline. Hearing intact. Voice is normal. Shoulder shrug is normal. The tongue has normal motion without fasciculations.   Coordination:    No dysmetria. decreaed amplitude on finger tapping.  Gait:    Difficulty getting out of chair.  Decreased arm swing. Short narrowed strides but not shuffling  Motor Observation:    Slight Postural tremor. Tone:    Mild cogwheeling right arm enhanced with facilitation  Posture:    Posture is normal. normal erect    Strength:  mild prox weakness otherwise strength is V/V in the upper and lower limbs.      Sensation: intact to LT     Reflex Exam:  DTR's:    Absent AJs   Toes:    The toes are downgoing bilaterally.   Clonus:    Clonus is absent.   Assessment/Plan:  77 year old patient with Parkinson's disease, +DAT scan.    Start medication as below. Provided information and written materials and recommendations for resources to family.   Carbidopa/Levodopa: Start with 1/2 a pill 3x a day. Take medication 4  hours apart (example 8am, noon and 4pm). In a week can increase to a whole pill three times a day.  -Try to separate Sinemet from food (especially protein-rich foods like meat, dairy, eggs) by about 30-60 mins (take 30 minutes before food or 60 minutes after food) - this will help the absorption of the medication. If you have some nausea with the medication, you can take it with some light food like crackers or ginger ale   Sarina Ill, MD  [facility] [address] [place], [place] [address]  Phone [phone] Fax [phone]  A total of 25 minutes was spent face-to-face with this patient. Over half this time was spent on counseling patient on the parkinson's disease diagnosis and different diagnostic and therapeutic options available.

## 2017-09-11 ENCOUNTER — Telehealth: Payer: Self-pay | Admitting: Neurology

## 2017-09-11 NOTE — Telephone Encounter (Signed)
Placed information re: Parkinson's Support Groups ([employer], etc) in enveloped and addressed to be mailed tomorrow 09/12/17.

## 2017-09-11 NOTE — Telephone Encounter (Signed)
Patient was seen last week by Dr. Jaynee Eagles and given information on a Parkinson's Support Group. She has misplaced this information and wants to know if we can mail information to her. A returned call is not needed unless there are questions.

## 2017-09-27 ENCOUNTER — Ambulatory Visit
Admission: RE | Admit: 2017-09-27 | Discharge: 2017-09-27 | Disposition: A | Discharge status: Home / Self Care | Payer: Medicare Other | Source: Ambulatory Visit | Attending: Internal Medicine | Admitting: Internal Medicine

## 2017-09-27 DIAGNOSIS — R9389 Abnormal findings on diagnostic imaging of other specified body structures: Secondary | ICD-10-CM

## 2017-12-05 ENCOUNTER — Other Ambulatory Visit: Payer: Self-pay | Admitting: Neurology

## 2018-01-29 ENCOUNTER — Other Ambulatory Visit: Payer: Self-pay | Admitting: Gastroenterology

## 2018-01-29 DIAGNOSIS — K862 Cyst of pancreas: Secondary | ICD-10-CM

## 2018-02-01 ENCOUNTER — Ambulatory Visit
Admission: RE | Admit: 2018-02-01 | Discharge: 2018-02-01 | Disposition: A | Discharge status: Home / Self Care | Payer: Medicare Other | Source: Ambulatory Visit | Attending: Gastroenterology | Admitting: Gastroenterology

## 2018-02-01 DIAGNOSIS — K862 Cyst of pancreas: Secondary | ICD-10-CM

## 2018-02-01 MED ORDER — GADOBENATE DIMEGLUMINE 529 MG/ML IV SOLN
14.0000 mL | Freq: Once | INTRAVENOUS | Status: AC | PRN
  Administered 2018-02-01: 14 mL via INTRAVENOUS

## 2018-03-05 ENCOUNTER — Encounter (HOSPITAL_COMMUNITY): Payer: Self-pay

## 2018-03-05 ENCOUNTER — Ambulatory Visit (HOSPITAL_COMMUNITY)
Admission: RE | Admit: 2018-03-05 | Discharge: 2018-03-05 | Disposition: A | Discharge status: Home / Self Care | Payer: Medicare Other | Source: Ambulatory Visit | Attending: Internal Medicine | Admitting: Internal Medicine

## 2018-03-05 DIAGNOSIS — M810 Age-related osteoporosis without current pathological fracture: Secondary | ICD-10-CM | POA: Insufficient documentation

## 2018-03-05 MED ORDER — ZOLEDRONIC ACID 5 MG/100ML IV SOLN
5.0000 mg | Freq: Once | INTRAVENOUS | Status: AC
  Administered 2018-03-05: 5 mg via INTRAVENOUS
  Filled 2018-03-05: qty 100

## 2018-03-05 MED ORDER — SODIUM CHLORIDE 0.9 % IV SOLN
Freq: Once | INTRAVENOUS | Status: AC
  Administered 2018-03-05: 13:00:00 via INTRAVENOUS

## 2018-03-05 NOTE — Discharge Instructions (Signed)
Zoledronic Acid injection (Paget's Disease, Osteoporosis)/ Reclast °What is this medicine? °ZOLEDRONIC ACID (ZOE le dron ik AS id) lowers the amount of calcium loss from bone. It is used to treat Paget's disease and osteoporosis in women. °This medicine may be used for other purposes; ask your health care provider or pharmacist if you have questions. °COMMON BRAND NAME(S): Reclast, Zometa °What should I tell my health care provider before I take this medicine? °They need to know if you have any of these conditions: °-aspirin-sensitive asthma °-cancer, especially if you are receiving medicines used to treat cancer °-dental disease or wear dentures °-infection °-kidney disease °-low levels of calcium in the blood °-past surgery on the parathyroid gland or intestines °-receiving corticosteroids like dexamethasone or prednisone °-an unusual or allergic reaction to zoledronic acid, other medicines, foods, dyes, or preservatives °-pregnant or trying to get pregnant °-breast-feeding °How should I use this medicine? °This medicine is for infusion into a vein. It is given by a health care professional in a hospital or clinic setting. °Talk to your pediatrician regarding the use of this medicine in children. This medicine is not approved for use in children. °Overdosage: If you think you have taken too much of this medicine contact a poison control center or emergency room at once. °NOTE: This medicine is only for you. Do not share this medicine with others. °What if I miss a dose? °It is important not to miss your dose. Call your doctor or health care professional if you are unable to keep an appointment. °What may interact with this medicine? °-certain antibiotics given by injection °-NSAIDs, medicines for pain and inflammation, like ibuprofen or naproxen °-some diuretics like bumetanide, furosemide °-teriparatide °This list may not describe all possible interactions. Give your health care provider a list of all the  medicines, herbs, non-prescription drugs, or dietary supplements you use. Also tell them if you smoke, drink alcohol, or use illegal drugs. Some items may interact with your medicine. °What should I watch for while using this medicine? °Visit your doctor or health care professional for regular checkups. It may be some time before you see the benefit from this medicine. Do not stop taking your medicine unless your doctor tells you to. Your doctor may order blood tests or other tests to see how you are doing. °Women should inform their doctor if they wish to become pregnant or think they might be pregnant. There is a potential for serious side effects to an unborn child. Talk to your health care professional or pharmacist for more information. °You should make sure that you get enough calcium and vitamin D while you are taking this medicine. Discuss the foods you eat and the vitamins you take with your health care professional. °Some people who take this medicine have severe bone, joint, and/or muscle pain. This medicine may also increase your risk for jaw problems or a broken thigh bone. Tell your doctor right away if you have severe pain in your jaw, bones, joints, or muscles. Tell your doctor if you have any pain that does not go away or that gets worse. °Tell your dentist and dental surgeon that you are taking this medicine. You should not have major dental surgery while on this medicine. See your dentist to have a dental exam and fix any dental problems before starting this medicine. Take good care of your teeth while on this medicine. Make sure you see your dentist for regular follow-up appointments. °What side effects may I notice from receiving this   medicine? °Side effects that you should report to your doctor or health care professional as soon as possible: °-allergic reactions like skin rash, itching or hives, swelling of the face, lips, or tongue °-anxiety, confusion, or depression °-breathing  problems °-changes in vision °-eye pain °-feeling faint or lightheaded, falls °-jaw pain, especially after dental work °-mouth sores °-muscle cramps, stiffness, or weakness °-redness, blistering, peeling or loosening of the skin, including inside the mouth °-trouble passing urine or change in the amount of urine °Side effects that usually do not require medical attention (report to your doctor or health care professional if they continue or are bothersome): °-bone, joint, or muscle pain °-constipation °-diarrhea °-fever °-hair loss °-irritation at site where injected °-loss of appetite °-nausea, vomiting °-stomach upset °-trouble sleeping °-trouble swallowing °-weak or tired °This list may not describe all possible side effects. Call your doctor for medical advice about side effects. You may report side effects to FDA at 1-800-FDA-1088. °Where should I keep my medicine? °This drug is given in a hospital or clinic and will not be stored at home. °NOTE: This sheet is a summary. It may not cover all possible information. If you have questions about this medicine, talk to your doctor, pharmacist, or health care provider. °© 2018 [employer] (2013-12-07 14:19:57) ° °

## 2018-04-02 ENCOUNTER — Encounter (HOSPITAL_COMMUNITY): Payer: Self-pay

## 2018-04-02 NOTE — Patient Instructions (Addendum)
Hailey [name]  04/02/2018   Your procedure is scheduled on: 04-09-18   Report to [facility] Main  Entrance    Report to admitting at 10:00AM    Call this number if you have problems the morning of surgery [phone]     Remember: Do not eat food or drink liquids :After Midnight.     Take these medicines the morning of surgery with A SIP OF WATER: metoprolol, sinemet, tylenol if needed                                 You may not have any metal on your body including hair pins and              piercings  Do not wear jewelry, make-up, lotions, powders or perfumes, deodorant             Do not wear nail polish.  Do not shave  48 hours prior to surgery.     Do not bring valuables to the hospital. [place].  Contacts, dentures or bridgework may not be worn into surgery.  Leave suitcase in the car. After surgery it may be brought to your room.                 Please read over the following fact sheets you were given: _____________________________________________________________________             [facility] - Preparing for Surgery Before surgery, you can play an important role.  Because skin is not sterile, your skin needs to be as free of germs as possible.  You can reduce the number of germs on your skin by washing with CHG (chlorahexidine gluconate) soap before surgery.  CHG is an antiseptic cleaner which kills germs and bonds with the skin to continue killing germs even after washing. Please DO NOT use if you have an allergy to CHG or antibacterial soaps.  If your skin becomes reddened/irritated stop using the CHG and inform your nurse when you arrive at Short Stay. Do not shave (including legs and underarms) for at least 48 hours prior to the first CHG shower.  You may shave your face/neck. Please follow these instructions carefully:  1.  Shower with CHG Soap the night before surgery and the  morning  of Surgery.  2.  If you choose to wash your hair, wash your hair first as usual with your  normal  shampoo.  3.  After you shampoo, rinse your hair and body thoroughly to remove the  shampoo.                           4.  Use CHG as you would any other liquid soap.  You can apply chg directly  to the skin and wash                       Gently with a scrungie or clean washcloth.  5.  Apply the CHG Soap to your body ONLY FROM THE NECK DOWN.   Do not use on face/ open  Wound or open sores. Avoid contact with eyes, ears mouth and genitals (private parts).                       Wash face,  Genitals (private parts) with your normal soap.             6.  Wash thoroughly, paying special attention to the area where your surgery  will be performed.  7.  Thoroughly rinse your body with warm water from the neck down.  8.  DO NOT shower/wash with your normal soap after using and rinsing off  the CHG Soap.                9.  Pat yourself dry with a clean towel.            10.  Wear clean pajamas.            11.  Place clean sheets on your bed the night of your first shower and do not  sleep with pets. Day of Surgery : Do not apply any lotions/deodorants the morning of surgery.  Please wear clean clothes to the hospital/surgery center.  FAILURE TO FOLLOW THESE INSTRUCTIONS MAY RESULT IN THE CANCELLATION OF YOUR SURGERY PATIENT SIGNATURE_________________________________  NURSE SIGNATURE__________________________________  ________________________________________________________________________   [name]  An incentive spirometer is a tool that can help keep your lungs clear and active. This tool measures how well you are filling your lungs with each breath. Taking long deep breaths may help reverse or decrease the chance of developing breathing (pulmonary) problems (especially infection) following:  A long period of time when you are unable to move or be  active. BEFORE THE PROCEDURE   If the spirometer includes an indicator to show your best effort, your nurse or respiratory therapist will set it to a desired goal.  If possible, sit up straight or lean slightly forward. Try not to slouch.  Hold the incentive spirometer in an upright position. INSTRUCTIONS FOR USE  1. Sit on the edge of your bed if possible, or sit up as far as you can in bed or on a chair. 2. Hold the incentive spirometer in an upright position. 3. Breathe out normally. 4. Place the mouthpiece in your mouth and seal your lips tightly around it. 5. Breathe in slowly and as deeply as possible, raising the piston or the ball toward the top of the column. 6. Hold your breath for 3-5 seconds or for as long as possible. Allow the piston or ball to fall to the bottom of the column. 7. Remove the mouthpiece from your mouth and breathe out normally. 8. Rest for a few seconds and repeat Steps 1 through 7 at least 10 times every 1-2 hours when you are awake. Take your time and take a few normal breaths between deep breaths. 9. The spirometer may include an indicator to show your best effort. Use the indicator as a goal to work toward during each repetition. 10. After each set of 10 deep breaths, practice coughing to be sure your lungs are clear. If you have an incision (the cut made at the time of surgery), support your incision when coughing by placing a pillow or rolled up towels firmly against it. Once you are able to get out of bed, walk around indoors and cough well. You may stop using the incentive spirometer when instructed by your caregiver.  RISKS AND COMPLICATIONS  Take your time so you do not get  dizzy or light-headed.  If you are in pain, you may need to take or ask for pain medication before doing incentive spirometry. It is harder to take a deep breath if you are having pain. AFTER USE  Rest and breathe slowly and easily.  It can be helpful to keep track of a log of  your progress. Your caregiver can provide you with a simple table to help with this. If you are using the spirometer at home, follow these instructions: [place] IF:   You are having difficultly using the spirometer.  You have trouble using the spirometer as often as instructed.  Your pain medication is not giving enough relief while using the spirometer.  You develop fever of 100.5 F (38.1 C) or higher. SEEK IMMEDIATE MEDICAL CARE IF:   You cough up bloody sputum that had not been present before.  You develop fever of 102 F (38.9 C) or greater.  You develop worsening pain at or near the incision site. MAKE SURE YOU:   Understand these instructions.  Will watch your condition.  Will get help right away if you are not doing well or get worse. Document Released: 11/21/2006 Document Revised: 10/03/2011 Document Reviewed: 01/22/2007 Exit[employer] Patient Information 2014 [employer], [place].   ________________________________________________________________________  WHAT IS A BLOOD TRANSFUSION? Blood Transfusion Information  A transfusion is the replacement of blood or some of its parts. Blood is made up of multiple cells which provide different functions.  Red blood cells carry oxygen and are used for blood loss replacement.  White blood cells fight against infection.  Platelets control bleeding.  Plasma helps clot blood.  Other blood products are available for specialized needs, such as hemophilia or other clotting disorders. BEFORE THE TRANSFUSION  Who gives blood for transfusions?   Healthy volunteers who are fully evaluated to make sure their blood is safe. This is blood bank blood. Transfusion therapy is the safest it has ever been in the practice of medicine. Before blood is taken from a donor, a complete history is taken to make sure that person has no history of diseases nor engages in risky social behavior (examples are intravenous drug use or sexual activity  with multiple partners). The donor's travel history is screened to minimize risk of transmitting infections, such as malaria. The donated blood is tested for signs of infectious diseases, such as HIV and hepatitis. The blood is then tested to be sure it is compatible with you in order to minimize the chance of a transfusion reaction. If you or a relative donates blood, this is often done in anticipation of surgery and is not appropriate for emergency situations. It takes many days to process the donated blood. RISKS AND COMPLICATIONS Although transfusion therapy is very safe and saves many lives, the main dangers of transfusion include:   Getting an infectious disease.  Developing a transfusion reaction. This is an allergic reaction to something in the blood you were given. Every precaution is taken to prevent this. The decision to have a blood transfusion has been considered carefully by your caregiver before blood is given. Blood is not given unless the benefits outweigh the risks. AFTER THE TRANSFUSION  Right after receiving a blood transfusion, you will usually feel much better and more energetic. This is especially true if your red blood cells have gotten low (anemic). The transfusion raises the level of the red blood cells which carry oxygen, and this usually causes an energy increase.  The nurse administering the transfusion will  monitor you carefully for complications. HOME CARE INSTRUCTIONS  No special instructions are needed after a transfusion. You may find your energy is better. Speak with your caregiver about any limitations on activity for underlying diseases you may have. SEEK MEDICAL CARE IF:   Your condition is not improving after your transfusion.  You develop redness or irritation at the intravenous (IV) site. SEEK IMMEDIATE MEDICAL CARE IF:  Any of the following symptoms occur over the next 12 hours:  Shaking chills.  You have a temperature by mouth above 102 F (38.9  C), not controlled by medicine.  Chest, back, or muscle pain.  People around you feel you are not acting correctly or are confused.  Shortness of breath or difficulty breathing.  Dizziness and fainting.  You get a rash or develop hives.  You have a decrease in urine output.  Your urine turns a dark color or changes to pink, red, or brown. Any of the following symptoms occur over the next 10 days:  You have a temperature by mouth above 102 F (38.9 C), not controlled by medicine.  Shortness of breath.  Weakness after normal activity.  The white part of the eye turns yellow (jaundice).  You have a decrease in the amount of urine or are urinating less often.  Your urine turns a dark color or changes to pink, red, or brown. Document Released: 07/08/2000 Document Revised: 10/03/2011 Document Reviewed: 02/25/2008 [facility] Patient Information 2014 [place], [place].  _______________________________________________________________________

## 2018-04-02 NOTE — Progress Notes (Signed)
LOV/Clearance Reginold Agent, NP 03-08-18 on chart   EKG 03-08-18 [place]  CT CHEST 09-27-17 [facility]

## 2018-04-03 ENCOUNTER — Encounter (HOSPITAL_COMMUNITY)
Admission: RE | Admit: 2018-04-03 | Discharge: 2018-04-03 | Disposition: A | Discharge status: Home / Self Care | Payer: Medicare Other | Source: Ambulatory Visit | Attending: Orthopedic Surgery | Admitting: Orthopedic Surgery

## 2018-04-03 ENCOUNTER — Other Ambulatory Visit: Payer: Self-pay

## 2018-04-03 ENCOUNTER — Encounter (HOSPITAL_COMMUNITY): Payer: Self-pay

## 2018-04-03 DIAGNOSIS — Z01812 Encounter for preprocedural laboratory examination: Secondary | ICD-10-CM | POA: Diagnosis not present

## 2018-04-03 DIAGNOSIS — M1711 Unilateral primary osteoarthritis, right knee: Secondary | ICD-10-CM | POA: Insufficient documentation

## 2018-04-03 HISTORY — DX: Localized edema: R60.0

## 2018-04-03 LAB — SURGICAL PCR SCREEN
MRSA, PCR: NEGATIVE
Staphylococcus aureus: NEGATIVE

## 2018-04-03 LAB — CBC
HEMATOCRIT: 38.7 % (ref 36.0–46.0)
Hemoglobin: 12.3 g/dL (ref 12.0–15.0)
MCH: 30.4 pg (ref 26.0–34.0)
MCHC: 31.8 g/dL (ref 30.0–36.0)
MCV: 95.6 fL (ref 78.0–100.0)
PLATELETS: 308 10*3/uL (ref 150–400)
RBC: 4.05 MIL/uL (ref 3.87–5.11)
RDW: 13.9 % (ref 11.5–15.5)
WBC: 6 10*3/uL (ref 4.0–10.5)

## 2018-04-03 LAB — COMPREHENSIVE METABOLIC PANEL
ALBUMIN: 4 g/dL (ref 3.5–5.0)
ALT: <5 U/L (ref 0–44)
ANION GAP: 8 (ref 5–15)
AST: 17 U/L (ref 15–41)
Alkaline Phosphatase: 62 U/L (ref 38–126)
BILIRUBIN TOTAL: 1.1 mg/dL (ref 0.3–1.2)
BUN: 21 mg/dL (ref 8–23)
CHLORIDE: 110 mmol/L (ref 98–111)
CO2: 27 mmol/L (ref 22–32)
Calcium: 9.4 mg/dL (ref 8.9–10.3)
Creatinine, Ser: 0.95 mg/dL (ref 0.44–1.00)
GFR calc Af Amer: >60 mL/min (ref >60)
GFR calc non Af Amer: 57 mL/min — ABNORMAL LOW (ref >60)
GLUCOSE: 93 mg/dL (ref 70–99)
POTASSIUM: 4.3 mmol/L (ref 3.5–5.1)
SODIUM: 145 mmol/L (ref 135–145)
TOTAL PROTEIN: 7 g/dL (ref 6.5–8.1)

## 2018-04-03 LAB — PROTIME-INR
INR: 1.03
Prothrombin Time: 13.4 seconds (ref 11.4–15.2)

## 2018-04-03 LAB — ABO/RH: ABO/RH(D): A POS

## 2018-04-03 LAB — APTT: APTT: 33 s (ref 24–36)

## 2018-04-08 MED ORDER — BUPIVACAINE LIPOSOME 1.3 % IJ SUSP
20.0000 mL | Freq: Once | INTRAMUSCULAR | Status: DC
  Filled 2018-04-08: qty 20

## 2018-04-08 MED ORDER — SODIUM CHLORIDE 0.9 % IV SOLN
2000.0000 mg | INTRAVENOUS | Status: DC
  Filled 2018-04-08: qty 20

## 2018-04-08 NOTE — H&P (Signed)
TOTAL KNEE ADMISSION H&P  Patient is being admitted for right total knee arthroplasty.  Subjective:  Chief Complaint:right knee pain.  HPI: Hailey [name], 77 y.o. female, has a history of pain and functional disability in the right knee due to arthritis and has failed non-surgical conservative treatments for greater than 12 weeks to includeNSAID's and/or analgesics, corticosteriod injections, viscosupplementation injections, flexibility and strengthening excercises, use of assistive devices and activity modification.  Onset of symptoms was gradual, starting >10 years ago with gradually worsening course since that time. The patient noted no past surgery on the right knee(s).  Patient currently rates pain in the right knee(s) at 5 out of 10 with activity. Patient has night pain, worsening of pain with activity and weight bearing, pain that interferes with activities of daily living, pain with passive range of motion, crepitus and joint swelling.  Patient has evidence of periarticular osteophytes and joint space narrowing by imaging studies.  There is no active infection.  Patient Active Problem List   Diagnosis Date Noted  . Parkinson's disease ([place]) 05/09/2017  . Parkinsonism ([place]) 02/14/2017  . BPPV (benign paroxysmal positional vertigo) 11/10/2014  . 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 Bilateral leg edema    takes lasix as needed for dependent edema   . H/O exercise stress test    4-5 yrs. ago, told that it was wnl , done at [place] office   . Hypertension   . Osteoporosis   . Parkinson disease [facility])     Past Surgical History:  Procedure Laterality Date  . BREAST CYST EXCISION Left 1994   benign  . cataract surgery Bilateral 2007  . EYE SURGERY     /w IOL  . FOOT TUMOR EXCISION Left 1992   benign  . MOUTH SURGERY    . REVERSE SHOULDER ARTHROPLASTY Right 12/26/2013   Procedure: RIGHT SHOULDER  REVERSE ARTHROPLASTY;  Surgeon: Marin Shutter, MD;  Location: [facility];  Service: Orthopedics;  Laterality: Right;    Current Facility-Administered Medications  Medication Dose Route Frequency Provider Last Rate Last Dose  . [START ON 04/09/2018] bupivacaine liposome (EXPAREL) 1.3 % injection 266 mg  20 mL Other Once Gaynelle Arabian, MD      .           Current Outpatient Medications  Medication Sig Dispense Refill Last Dose  . acetaminophen (TYLENOL) 650 MG CR tablet Take 1,300 mg by mouth every 8 (eight) hours as needed for pain.   Taking  . amoxicillin (AMOXIL) 500 MG capsule Take 2,000 mg by mouth See admin instructions. Take 4 capsules (2000 mg) by mouth one hour prior to dental appointment   Taking  . aspirin EC 81 MG tablet Take 81 mg by mouth daily.   Taking  . Calcium Carbonate-Vitamin D (CALTRATE 600+D) 600-400 MG-UNIT per tablet Take 1 tablet by mouth daily.   Taking  . carbidopa-levodopa (SINEMET IR) 25-100 MG tablet TAKE 1 TABLET BY MOUTH 3 TIMES DAILY, TAKE 4 HOURS APART, DO NOT EAT WITH PROTEIN-RICH FOODS (Patient taking differently: Take 1 tablet by mouth 3 (three) times daily. 0800, 1200, & 1600) 90 tablet 6   . furosemide (LASIX) 20 MG tablet Take 20 mg by mouth daily as needed for fluid or edema.    Taking  . metoprolol succinate (TOPROL-XL) 100 MG 24 hr tablet Take 100 mg by mouth daily. Take with or immediately following  a meal.     . Vitamin D, Ergocalciferol, (DRISDOL) 50000 units CAPS capsule Take 50,000 Units by mouth 2 (two) times a week. Tuesdays and Fridays   Taking   Allergies  Allergen Reactions  . Forteo [Teriparatide (Recombinant)] Other (See Comments)    " strange feeling and I cannot describe how I felt but the Dr took me off of it"  . Merthiolate [Thimerosal] Other (See Comments)    Tincture Merthiolate-skin blistering    Social History   Tobacco Use  . Smoking status: Former Smoker    Years: 10.00    Types: Cigarettes    Last attempt to quit: 12/06/1968     Years since quitting: 49.3  . Smokeless tobacco: Former Network engineer Use Topics  . Alcohol use: No    Alcohol/week: 0.0 standard drinks    Family History  Problem Relation Age of Onset  . Colon cancer Mother   . Heart Problems Mother   . Heart Problems Father   . Heart Problems Brother   . Breast cancer Cousin   . Breast cancer Cousin   . Breast cancer Cousin   . Breast cancer Cousin   . Breast cancer Cousin      Review of Systems  Constitutional: Negative.   HENT: Negative.   Eyes: Negative.   Respiratory: Negative.   Cardiovascular: Negative.   Gastrointestinal: Negative.   Genitourinary: Positive for frequency. Negative for dysuria, flank pain, hematuria and urgency.  Musculoskeletal: Positive for joint pain and myalgias. Negative for back pain, falls and neck pain.  Skin: Negative.   Neurological: Positive for tremors. Negative for dizziness, tingling, sensory change, speech change, focal weakness, seizures, loss of consciousness, weakness and headaches.  Endo/Heme/Allergies: Negative.   Psychiatric/Behavioral: Negative.     Objective:  Physical Exam  Constitutional: She is oriented to person, place, and time. She appears well-developed and well-nourished. No distress.  HENT:  Head: Normocephalic and atraumatic.  Right Ear: External ear normal.  Left Ear: External ear normal.  Nose: Nose normal.  Mouth/Throat: Oropharynx is clear and moist.  Eyes: Conjunctivae and EOM are normal.  Neck: Normal range of motion. Neck supple.  Cardiovascular: Normal rate, regular rhythm and intact distal pulses.  Murmur heard.  Systolic murmur is present with a grade of 3/6. Respiratory: Effort normal. No respiratory distress. She has no wheezes.  GI: Soft. Bowel sounds are normal. She exhibits no distension. There is no tenderness.  Musculoskeletal:       Right hip: Normal.       Left hip: Normal.  Musculoskeletal Right Knee Exam: Moderate effusion. No Swelling. Range  of motion is 5-125 degrees. Moderate crepitus on range of motion of the knee. No medial joint line tenderness No lateral joint line tenderness. Stable knee.  Neurological: She is alert and oriented to person, place, and time. She has normal strength. No sensory deficit.  Skin: No rash noted. She is not diaphoretic. No erythema.  Psychiatric: She has a normal mood and affect. Her behavior is normal.     Ht: 5 ft 5 in  Wt: 155 lbs  BMI: 25.8  BP: 142/80 sitting L arm  Pulse: 68 bpm    Imaging Review Plain radiographs demonstrate severe degenerative joint disease of the right knee(s). The overall alignment ismild varus. The bone quality appears to be good for age and reported activity level.   Preoperative templating of the joint replacement has been completed, documented, and submitted to the Operating Room personnel in order to  optimize intra-operative equipment management.   Anticipated LOS equal to or greater than 2 midnights due to - Age 68 and older with one or more of the following:  - Obesity  - Expected need for hospital services (PT, OT, Nursing) required for safe  discharge  - Anticipated need for postoperative skilled nursing care or inpatient rehab  - Active co-morbidities: Parkinson's Disease OR   - Unanticipated findings during/Post Surgery: None  - Patient is a high risk of re-admission due to: None     Assessment/Plan:  End stage primary osteoarthritis, right knee   The patient history, physical examination, clinical judgment of the provider and imaging studies are consistent with end stage degenerative joint disease of the right knee(s) and total knee arthroplasty is deemed medically necessary. The treatment options including medical management, injection therapy arthroscopy and arthroplasty were discussed at length. The risks and benefits of total knee arthroplasty were presented and reviewed. The risks due to aseptic loosening, infection, stiffness, patella  tracking problems, thromboembolic complications and other imponderables were discussed. The patient acknowledged the explanation, agreed to proceed with the plan and consent was signed. Patient is being admitted for inpatient treatment for surgery, pain control, PT, OT, prophylactic antibiotics, VTE prophylaxis, progressive ambulation and ADL's and discharge planning. The patient is planning to be discharged home with outpatient therapy   Therapy Plans: outpatient therapy at [facility] Disposition: Home with sister-in-law Planned DVT prophylaxis: aspirin 325mg  BID DME needed: none needed PCP: Dr. Dagmar Hait Other: no anesthesia concerns    Ardeen Jourdain, PA-C

## 2018-04-08 NOTE — Anesthesia Preprocedure Evaluation (Addendum)
Anesthesia Evaluation  Patient identified by MRN, date of birth, ID band Patient awake    Reviewed: Allergy & Precautions, H&P , NPO status , Patient's Chart, lab work & pertinent test results, reviewed documented beta blocker date and time   Airway Mallampati: II  TM Distance: >3 FB Neck ROM: Full    Dental no notable dental hx. (+) Teeth Intact, Dental Advisory Given   Pulmonary neg pulmonary ROS, former smoker,    Pulmonary exam normal breath sounds clear to auscultation       Cardiovascular Exercise Tolerance: Good hypertension, Pt. on medications and Pt. on home beta blockers  Rhythm:Regular Rate:Normal     Neuro/Psych Parkinson's disease negative psych ROS   GI/Hepatic negative GI ROS, Neg liver ROS,   Endo/Other  negative endocrine ROS  Renal/GU negative Renal ROS  negative genitourinary   Musculoskeletal  (+) Arthritis ,   Abdominal   Peds  Hematology negative hematology ROS (+)   Anesthesia Other Findings   Reproductive/Obstetrics negative OB ROS                            Anesthesia Physical Anesthesia Plan  ASA: II  Anesthesia Plan: Spinal   Post-op Pain Management:  Regional for Post-op pain   Induction: Intravenous  PONV Risk Score and Plan: 3 and Ondansetron, Dexamethasone, Propofol infusion and Midazolam  Airway Management Planned: Simple Face Mask  Additional Equipment:   Intra-op Plan:   Post-operative Plan:   Informed Consent: I have reviewed the patients History and Physical, chart, labs and discussed the procedure including the risks, benefits and alternatives for the proposed anesthesia with the patient or authorized representative who has indicated his/her understanding and acceptance.   Dental advisory given  Plan Discussed with: CRNA  Anesthesia Plan Comments:        Anesthesia Quick Evaluation

## 2018-04-09 ENCOUNTER — Encounter (HOSPITAL_COMMUNITY): Payer: Self-pay | Admitting: *Deleted

## 2018-04-09 ENCOUNTER — Inpatient Hospital Stay (HOSPITAL_COMMUNITY)
Admission: RE | Admit: 2018-04-09 | Discharge: 2018-04-12 | DRG: 470 | Disposition: A | Discharge status: Home / Self Care | Payer: Medicare Other | Source: Ambulatory Visit | Attending: Orthopedic Surgery | Admitting: Orthopedic Surgery

## 2018-04-09 ENCOUNTER — Inpatient Hospital Stay (HOSPITAL_COMMUNITY): Payer: Medicare Other | Admitting: Anesthesiology

## 2018-04-09 ENCOUNTER — Encounter (HOSPITAL_COMMUNITY)
Admission: RE | Disposition: A | Discharge status: Home / Self Care | Payer: Self-pay | Source: Ambulatory Visit | Attending: Orthopedic Surgery

## 2018-04-09 ENCOUNTER — Other Ambulatory Visit: Payer: Self-pay

## 2018-04-09 DIAGNOSIS — Z7982 Long term (current) use of aspirin: Secondary | ICD-10-CM | POA: Diagnosis not present

## 2018-04-09 DIAGNOSIS — Z961 Presence of intraocular lens: Secondary | ICD-10-CM | POA: Diagnosis present

## 2018-04-09 DIAGNOSIS — Z800 Family history of malignant neoplasm of digestive organs: Secondary | ICD-10-CM

## 2018-04-09 DIAGNOSIS — I10 Essential (primary) hypertension: Secondary | ICD-10-CM | POA: Diagnosis present

## 2018-04-09 DIAGNOSIS — R600 Localized edema: Secondary | ICD-10-CM | POA: Diagnosis present

## 2018-04-09 DIAGNOSIS — Z888 Allergy status to other drugs, medicaments and biological substances status: Secondary | ICD-10-CM | POA: Diagnosis not present

## 2018-04-09 DIAGNOSIS — M25761 Osteophyte, right knee: Secondary | ICD-10-CM | POA: Diagnosis present

## 2018-04-09 DIAGNOSIS — G20 Parkinson's disease: Secondary | ICD-10-CM | POA: Diagnosis present

## 2018-04-09 DIAGNOSIS — E669 Obesity, unspecified: Secondary | ICD-10-CM | POA: Diagnosis present

## 2018-04-09 DIAGNOSIS — Z6825 Body mass index (BMI) 25.0-25.9, adult: Secondary | ICD-10-CM

## 2018-04-09 DIAGNOSIS — Z803 Family history of malignant neoplasm of breast: Secondary | ICD-10-CM | POA: Diagnosis not present

## 2018-04-09 DIAGNOSIS — M1711 Unilateral primary osteoarthritis, right knee: Principal | ICD-10-CM | POA: Diagnosis present

## 2018-04-09 DIAGNOSIS — Z9842 Cataract extraction status, left eye: Secondary | ICD-10-CM | POA: Diagnosis not present

## 2018-04-09 DIAGNOSIS — M1710 Unilateral primary osteoarthritis, unspecified knee: Secondary | ICD-10-CM

## 2018-04-09 DIAGNOSIS — Z87891 Personal history of nicotine dependence: Secondary | ICD-10-CM | POA: Diagnosis not present

## 2018-04-09 DIAGNOSIS — Z96611 Presence of right artificial shoulder joint: Secondary | ICD-10-CM | POA: Diagnosis present

## 2018-04-09 DIAGNOSIS — Z79899 Other long term (current) drug therapy: Secondary | ICD-10-CM | POA: Diagnosis not present

## 2018-04-09 DIAGNOSIS — Z9841 Cataract extraction status, right eye: Secondary | ICD-10-CM | POA: Diagnosis not present

## 2018-04-09 DIAGNOSIS — M810 Age-related osteoporosis without current pathological fracture: Secondary | ICD-10-CM | POA: Diagnosis present

## 2018-04-09 DIAGNOSIS — M179 Osteoarthritis of knee, unspecified: Secondary | ICD-10-CM

## 2018-04-09 HISTORY — PX: TOTAL KNEE ARTHROPLASTY: SHX125

## 2018-04-09 LAB — TYPE AND SCREEN
ABO/RH(D): A POS
ANTIBODY SCREEN: NEGATIVE

## 2018-04-09 SURGERY — ARTHROPLASTY, KNEE, TOTAL
Anesthesia: Spinal | Site: Knee | Laterality: Right

## 2018-04-09 MED ORDER — BUPIVACAINE HCL (PF) 0.25 % IJ SOLN
INTRAMUSCULAR | Status: AC
  Filled 2018-04-09: qty 30

## 2018-04-09 MED ORDER — SODIUM CHLORIDE 0.9 % IJ SOLN
INTRAMUSCULAR | Status: DC | PRN
  Administered 2018-04-09: 60 mL

## 2018-04-09 MED ORDER — SODIUM CHLORIDE 0.9 % IV SOLN
INTRAVENOUS | Status: DC
  Administered 2018-04-09: 19:00:00 via INTRAVENOUS

## 2018-04-09 MED ORDER — DEXAMETHASONE SODIUM PHOSPHATE 10 MG/ML IJ SOLN
INTRAMUSCULAR | Status: AC
  Filled 2018-04-09: qty 1

## 2018-04-09 MED ORDER — DEXAMETHASONE SODIUM PHOSPHATE 10 MG/ML IJ SOLN
10.0000 mg | Freq: Once | INTRAMUSCULAR | Status: AC
  Administered 2018-04-10: 10 mg via INTRAVENOUS
  Filled 2018-04-09: qty 1

## 2018-04-09 MED ORDER — PHENOL 1.4 % MT LIQD: 1.0000 | OROMUCOSAL | Status: DC | PRN

## 2018-04-09 MED ORDER — FLEET ENEMA 7-19 GM/118ML RE ENEM: 1.0000 | ENEMA | Freq: Once | RECTAL | Status: DC | PRN

## 2018-04-09 MED ORDER — CHLORHEXIDINE GLUCONATE 4 % EX LIQD: 60.0000 mL | Freq: Once | CUTANEOUS | Status: DC

## 2018-04-09 MED ORDER — OXYCODONE HCL 5 MG PO TABS
10.0000 mg | ORAL_TABLET | ORAL | Status: DC | PRN
  Administered 2018-04-09 – 2018-04-10 (×2): 10 mg via ORAL
  Filled 2018-04-09 (×2): qty 2

## 2018-04-09 MED ORDER — LACTATED RINGERS IV SOLN
INTRAVENOUS | Status: DC
  Administered 2018-04-09 (×2): via INTRAVENOUS

## 2018-04-09 MED ORDER — OXYCODONE HCL 5 MG PO TABS
5.0000 mg | ORAL_TABLET | ORAL | Status: DC | PRN
  Administered 2018-04-09 (×2): 5 mg via ORAL
  Administered 2018-04-10: 10 mg via ORAL
  Administered 2018-04-10: 5 mg via ORAL
  Administered 2018-04-10 – 2018-04-11 (×2): 10 mg via ORAL
  Filled 2018-04-09: qty 1
  Filled 2018-04-09: qty 2
  Filled 2018-04-09 (×2): qty 1
  Filled 2018-04-09 (×2): qty 2

## 2018-04-09 MED ORDER — SODIUM CHLORIDE 0.9 % IJ SOLN
INTRAMUSCULAR | Status: AC
  Filled 2018-04-09: qty 50

## 2018-04-09 MED ORDER — ONDANSETRON HCL 4 MG PO TABS: 4.0000 mg | ORAL_TABLET | Freq: Four times a day (QID) | ORAL | Status: DC | PRN

## 2018-04-09 MED ORDER — ACETAMINOPHEN 10 MG/ML IV SOLN
1000.0000 mg | Freq: Four times a day (QID) | INTRAVENOUS | Status: DC
  Administered 2018-04-09: 1000 mg via INTRAVENOUS
  Filled 2018-04-09: qty 100

## 2018-04-09 MED ORDER — BUPIVACAINE LIPOSOME 1.3 % IJ SUSP
INTRAMUSCULAR | Status: DC | PRN
  Administered 2018-04-09: 20 mL

## 2018-04-09 MED ORDER — DEXAMETHASONE SODIUM PHOSPHATE 10 MG/ML IJ SOLN
INTRAMUSCULAR | Status: DC | PRN
  Administered 2018-04-09: 10 mg via INTRAVENOUS

## 2018-04-09 MED ORDER — SODIUM CHLORIDE 0.9 % IR SOLN
Status: DC | PRN
  Administered 2018-04-09: 1000 mL

## 2018-04-09 MED ORDER — PHENYLEPHRINE 40 MCG/ML (10ML) SYRINGE FOR IV PUSH (FOR BLOOD PRESSURE SUPPORT)
PREFILLED_SYRINGE | INTRAVENOUS | Status: DC | PRN
  Administered 2018-04-09 (×2): 40 ug via INTRAVENOUS
  Administered 2018-04-09: 80 ug via INTRAVENOUS
  Administered 2018-04-09 (×2): 40 ug via INTRAVENOUS
  Administered 2018-04-09 (×2): 80 ug via INTRAVENOUS

## 2018-04-09 MED ORDER — LIDOCAINE 2% (20 MG/ML) 5 ML SYRINGE
INTRAMUSCULAR | Status: DC | PRN
  Administered 2018-04-09: 40 mg via INTRAVENOUS

## 2018-04-09 MED ORDER — SODIUM CHLORIDE 0.9 % IJ SOLN
INTRAMUSCULAR | Status: AC
  Filled 2018-04-09: qty 10

## 2018-04-09 MED ORDER — ROPIVACAINE HCL 5 MG/ML IJ SOLN
INTRAMUSCULAR | Status: DC | PRN
  Administered 2018-04-09: 20 mL via PERINEURAL

## 2018-04-09 MED ORDER — METOCLOPRAMIDE HCL 5 MG/ML IJ SOLN: 5.0000 mg | Freq: Three times a day (TID) | INTRAMUSCULAR | Status: DC | PRN

## 2018-04-09 MED ORDER — DIPHENHYDRAMINE HCL 12.5 MG/5ML PO ELIX: 12.5000 mg | ORAL_SOLUTION | ORAL | Status: DC | PRN

## 2018-04-09 MED ORDER — POLYETHYLENE GLYCOL 3350 17 G PO PACK: 17.0000 g | PACK | Freq: Every day | ORAL | Status: DC | PRN

## 2018-04-09 MED ORDER — BISACODYL 10 MG RE SUPP: 10.0000 mg | Freq: Every day | RECTAL | Status: DC | PRN

## 2018-04-09 MED ORDER — HYDROMORPHONE HCL 1 MG/ML IJ SOLN: 0.5000 mg | INTRAMUSCULAR | Status: DC | PRN

## 2018-04-09 MED ORDER — CEFAZOLIN SODIUM-DEXTROSE 2-4 GM/100ML-% IV SOLN
2.0000 g | INTRAVENOUS | Status: AC
  Administered 2018-04-09: 2 g via INTRAVENOUS
  Filled 2018-04-09: qty 100

## 2018-04-09 MED ORDER — METOPROLOL SUCCINATE ER 50 MG PO TB24
100.0000 mg | ORAL_TABLET | Freq: Every day | ORAL | Status: DC
  Administered 2018-04-10 – 2018-04-12 (×3): 100 mg via ORAL
  Filled 2018-04-09 (×4): qty 2

## 2018-04-09 MED ORDER — CEFAZOLIN SODIUM-DEXTROSE 2-4 GM/100ML-% IV SOLN
2.0000 g | Freq: Four times a day (QID) | INTRAVENOUS | Status: AC
  Administered 2018-04-09 (×2): 2 g via INTRAVENOUS
  Filled 2018-04-09 (×2): qty 100

## 2018-04-09 MED ORDER — FUROSEMIDE 20 MG PO TABS: 20.0000 mg | ORAL_TABLET | Freq: Every day | ORAL | Status: DC | PRN

## 2018-04-09 MED ORDER — ASPIRIN EC 325 MG PO TBEC
325.0000 mg | DELAYED_RELEASE_TABLET | Freq: Two times a day (BID) | ORAL | Status: DC
  Administered 2018-04-10 – 2018-04-12 (×5): 325 mg via ORAL
  Filled 2018-04-09 (×5): qty 1

## 2018-04-09 MED ORDER — HYDROMORPHONE HCL 1 MG/ML IJ SOLN: 0.2500 mg | INTRAMUSCULAR | Status: DC | PRN

## 2018-04-09 MED ORDER — BUPIVACAINE IN DEXTROSE 0.75-8.25 % IT SOLN
INTRATHECAL | Status: DC | PRN
  Administered 2018-04-09: 1.4 mL via INTRATHECAL

## 2018-04-09 MED ORDER — MENTHOL 3 MG MT LOZG: 1.0000 | LOZENGE | OROMUCOSAL | Status: DC | PRN

## 2018-04-09 MED ORDER — ONDANSETRON HCL 4 MG/2ML IJ SOLN
INTRAMUSCULAR | Status: DC | PRN
  Administered 2018-04-09: 4 mg via INTRAVENOUS

## 2018-04-09 MED ORDER — ACETAMINOPHEN 500 MG PO TABS
1000.0000 mg | ORAL_TABLET | Freq: Four times a day (QID) | ORAL | Status: AC
  Administered 2018-04-09 – 2018-04-10 (×4): 1000 mg via ORAL
  Filled 2018-04-09 (×5): qty 2

## 2018-04-09 MED ORDER — CARBIDOPA-LEVODOPA 25-100 MG PO TABS
1.0000 | ORAL_TABLET | Freq: Three times a day (TID) | ORAL | Status: DC
  Administered 2018-04-09 – 2018-04-12 (×8): 1 via ORAL
  Filled 2018-04-09 (×8): qty 1

## 2018-04-09 MED ORDER — DEXAMETHASONE SODIUM PHOSPHATE 10 MG/ML IJ SOLN: 8.0000 mg | Freq: Once | INTRAMUSCULAR | Status: DC

## 2018-04-09 MED ORDER — ONDANSETRON HCL 4 MG/2ML IJ SOLN
INTRAMUSCULAR | Status: AC
  Filled 2018-04-09: qty 2

## 2018-04-09 MED ORDER — MIDAZOLAM HCL 2 MG/2ML IJ SOLN
1.0000 mg | INTRAMUSCULAR | Status: DC
  Administered 2018-04-09: 1 mg via INTRAVENOUS
  Filled 2018-04-09: qty 2

## 2018-04-09 MED ORDER — TRANEXAMIC ACID 1000 MG/10ML IV SOLN
1000.0000 mg | INTRAVENOUS | Status: AC
  Administered 2018-04-09: 1000 mg via INTRAVENOUS
  Filled 2018-04-09: qty 10

## 2018-04-09 MED ORDER — METHOCARBAMOL 500 MG IVPB - SIMPLE MED
500.0000 mg | Freq: Four times a day (QID) | INTRAVENOUS | Status: DC | PRN
  Filled 2018-04-09: qty 50

## 2018-04-09 MED ORDER — PROPOFOL 500 MG/50ML IV EMUL
INTRAVENOUS | Status: DC | PRN
  Administered 2018-04-09: 50 ug/kg/min via INTRAVENOUS

## 2018-04-09 MED ORDER — PROPOFOL 10 MG/ML IV BOLUS
INTRAVENOUS | Status: AC
  Filled 2018-04-09: qty 20

## 2018-04-09 MED ORDER — ONDANSETRON HCL 4 MG/2ML IJ SOLN: 4.0000 mg | Freq: Four times a day (QID) | INTRAMUSCULAR | Status: DC | PRN

## 2018-04-09 MED ORDER — TRAMADOL HCL 50 MG PO TABS
50.0000 mg | ORAL_TABLET | Freq: Four times a day (QID) | ORAL | Status: DC | PRN
  Administered 2018-04-09 – 2018-04-12 (×4): 100 mg via ORAL
  Filled 2018-04-09 (×4): qty 2

## 2018-04-09 MED ORDER — METOCLOPRAMIDE HCL 5 MG PO TABS: 5.0000 mg | ORAL_TABLET | Freq: Three times a day (TID) | ORAL | Status: DC | PRN

## 2018-04-09 MED ORDER — EPHEDRINE SULFATE-NACL 50-0.9 MG/10ML-% IV SOSY
PREFILLED_SYRINGE | INTRAVENOUS | Status: DC | PRN
  Administered 2018-04-09 (×5): 5 mg via INTRAVENOUS

## 2018-04-09 MED ORDER — FENTANYL CITRATE (PF) 100 MCG/2ML IJ SOLN
50.0000 ug | INTRAMUSCULAR | Status: DC
  Administered 2018-04-09: 50 ug via INTRAVENOUS
  Filled 2018-04-09: qty 2

## 2018-04-09 MED ORDER — METHOCARBAMOL 500 MG PO TABS
500.0000 mg | ORAL_TABLET | Freq: Four times a day (QID) | ORAL | Status: DC | PRN
  Administered 2018-04-09 – 2018-04-12 (×4): 500 mg via ORAL
  Filled 2018-04-09 (×4): qty 1

## 2018-04-09 MED ORDER — TRANEXAMIC ACID 1000 MG/10ML IV SOLN
1000.0000 mg | Freq: Once | INTRAVENOUS | Status: DC
  Filled 2018-04-09: qty 10

## 2018-04-09 MED ORDER — LIDOCAINE 2% (20 MG/ML) 5 ML SYRINGE
INTRAMUSCULAR | Status: AC
  Filled 2018-04-09: qty 10

## 2018-04-09 MED ORDER — DOCUSATE SODIUM 100 MG PO CAPS
100.0000 mg | ORAL_CAPSULE | Freq: Two times a day (BID) | ORAL | Status: DC
  Administered 2018-04-09 – 2018-04-12 (×6): 100 mg via ORAL
  Filled 2018-04-09 (×6): qty 1

## 2018-04-09 MED ORDER — STERILE WATER FOR IRRIGATION IR SOLN
Status: DC | PRN
  Administered 2018-04-09: 2000 mL

## 2018-04-09 SURGICAL SUPPLY — 59 items
BAG ZIPLOCK 12X15 (MISCELLANEOUS) ×3 IMPLANT
BANDAGE ACE 6X5 VEL STRL LF (GAUZE/BANDAGES/DRESSINGS) ×3 IMPLANT
BLADE SAG 18X100X1.27 (BLADE) ×3 IMPLANT
BLADE SAW SGTL 11.0X1.19X90.0M (BLADE) ×3 IMPLANT
BOWL SMART MIX CTS (DISPOSABLE) ×3 IMPLANT
CEMENT HV SMART SET (Cement) ×6 IMPLANT
CEMENT TIBIA MBT (Knees) ×1 IMPLANT
CLOSURE WOUND 1/2 X4 (GAUZE/BANDAGES/DRESSINGS) ×2
COVER SURGICAL LIGHT HANDLE (MISCELLANEOUS) ×3 IMPLANT
CUFF TOURN SGL QUICK 34 (TOURNIQUET CUFF) ×2
CUFF TRNQT CYL 34X4X40X1 (TOURNIQUET CUFF) ×1 IMPLANT
DECANTER SPIKE VIAL GLASS SM (MISCELLANEOUS) ×3 IMPLANT
DRAPE U-SHAPE 47X51 STRL (DRAPES) ×3 IMPLANT
DRSG ADAPTIC 3X8 NADH LF (GAUZE/BANDAGES/DRESSINGS) ×3 IMPLANT
DRSG PAD ABDOMINAL 8X10 ST (GAUZE/BANDAGES/DRESSINGS) ×3 IMPLANT
DURAPREP 26ML APPLICATOR (WOUND CARE) ×3 IMPLANT
ELECT REM PT RETURN 15FT ADLT (MISCELLANEOUS) ×3 IMPLANT
EVACUATOR 1/8 PVC DRAIN (DRAIN) ×3 IMPLANT
FEMUR SIGMA PS SZ 3.0 R (Femur) ×3 IMPLANT
GAUZE SPONGE 4X4 12PLY STRL (GAUZE/BANDAGES/DRESSINGS) ×3 IMPLANT
GLOVE BIO SURGEON STRL SZ8 (GLOVE) ×6 IMPLANT
GLOVE BIOGEL PI IND STRL 6.5 (GLOVE) ×1 IMPLANT
GLOVE BIOGEL PI IND STRL 7.5 (GLOVE) ×2 IMPLANT
GLOVE BIOGEL PI IND STRL 8 (GLOVE) ×2 IMPLANT
GLOVE BIOGEL PI IND STRL 9 (GLOVE) ×1 IMPLANT
GLOVE BIOGEL PI INDICATOR 6.5 (GLOVE) ×2
GLOVE BIOGEL PI INDICATOR 7.5 (GLOVE) ×4
GLOVE BIOGEL PI INDICATOR 8 (GLOVE) ×4
GLOVE BIOGEL PI INDICATOR 9 (GLOVE) ×2
GLOVE ECLIPSE 7.5 STRL STRAW (GLOVE) ×3 IMPLANT
GLOVE ECLIPSE 8.5 STRL (GLOVE) ×3 IMPLANT
GLOVE SURG SS PI 6.5 STRL IVOR (GLOVE) ×3 IMPLANT
GOWN SPEC L3 XXLG W/TWL (GOWN DISPOSABLE) ×3 IMPLANT
GOWN STRL REUS W/ TWL XL LVL3 (GOWN DISPOSABLE) ×1 IMPLANT
GOWN STRL REUS W/TWL LRG LVL3 (GOWN DISPOSABLE) ×6 IMPLANT
GOWN STRL REUS W/TWL XL LVL3 (GOWN DISPOSABLE) ×2
HANDPIECE INTERPULSE COAX TIP (DISPOSABLE) ×2
HOLDER FOLEY CATH W/STRAP (MISCELLANEOUS) ×3 IMPLANT
IMMOBILIZER KNEE 20 (SOFTGOODS) ×3
IMMOBILIZER KNEE 20 THIGH 36 (SOFTGOODS) ×1 IMPLANT
INSERT TIBIAL PFC SIG SZ3 10MM (Knees) ×3 IMPLANT
MANIFOLD NEPTUNE II (INSTRUMENTS) ×3 IMPLANT
PACK TOTAL KNEE CUSTOM (KITS) ×3 IMPLANT
PAD ABD 8X10 STRL (GAUZE/BANDAGES/DRESSINGS) ×3 IMPLANT
PADDING CAST COTTON 6X4 STRL (CAST SUPPLIES) ×6 IMPLANT
PATELLA DOME PFC 35MM (Knees) ×3 IMPLANT
PIN STEINMAN FIXATION KNEE (PIN) ×3 IMPLANT
POSITIONER SURGICAL ARM (MISCELLANEOUS) ×3 IMPLANT
SET HNDPC FAN SPRY TIP SCT (DISPOSABLE) ×1 IMPLANT
STRIP CLOSURE SKIN 1/2X4 (GAUZE/BANDAGES/DRESSINGS) ×4 IMPLANT
SUT MNCRL AB 4-0 PS2 18 (SUTURE) ×3 IMPLANT
SUT STRATAFIX 0 PDS 27 VIOLET (SUTURE) ×3
SUT VIC AB 2-0 CT1 27 (SUTURE) ×6
SUT VIC AB 2-0 CT1 TAPERPNT 27 (SUTURE) ×3 IMPLANT
SUTURE STRATFX 0 PDS 27 VIOLET (SUTURE) ×1 IMPLANT
TIBIA MBT CEMENT (Knees) ×3 IMPLANT
TRAY FOLEY CATH 14FR (SET/KITS/TRAYS/PACK) ×3 IMPLANT
WRAP KNEE MAXI GEL POST OP (GAUZE/BANDAGES/DRESSINGS) ×3 IMPLANT
YANKAUER SUCT BULB TIP 10FT TU (MISCELLANEOUS) ×3 IMPLANT

## 2018-04-09 NOTE — Anesthesia Procedure Notes (Signed)
Anesthesia Regional Block: Adductor canal block   Pre-Anesthetic Checklist: ,, timeout performed, Correct Patient, Correct Site, Correct Laterality, Correct Procedure, Correct Position, site marked, Risks and benefits discussed, pre-op evaluation,  At surgeon's request and post-op pain management  Laterality: Right  Prep: Maximum Sterile Barrier Precautions used, chloraprep       Needles:  Injection technique: Single-shot  Needle Type: Echogenic Stimulator Needle     Needle Length: 9cm  Needle Gauge: 21     Additional Needles:   Procedures:,,,, ultrasound used (permanent image in chart),,,,  Narrative:  Start time: 04/09/2018 11:21 AM End time: 04/09/2018 11:31 AM Injection made incrementally with aspirations every 5 mL.  Performed by: Personally  Anesthesiologist: Roderic Palau, MD  Additional Notes: 2% Lidocaine skin wheel.

## 2018-04-09 NOTE — Discharge Instructions (Signed)

## 2018-04-09 NOTE — Anesthesia Postprocedure Evaluation (Signed)
Anesthesia Post Note  Patient: [name]  Procedure(s) Performed: RIGHT TOTAL KNEE ARTHROPLASTY (Right Knee)     Patient location during evaluation: PACU Anesthesia Type: Spinal and Regional Level of consciousness: oriented and awake and alert Pain management: pain level controlled Vital Signs Assessment: post-procedure vital signs reviewed and stable Respiratory status: spontaneous breathing, respiratory function stable and patient connected to nasal cannula oxygen Cardiovascular status: blood pressure returned to baseline and stable Postop Assessment: no headache, no backache, no apparent nausea or vomiting, patient able to bend at knees and spinal receding Anesthetic complications: no    Last Vitals:  Vitals:   04/09/18 1415 04/09/18 1445  BP: (!) 167/82   Pulse: 71   Resp: 17   Temp:  36.7 C  SpO2: 100%     Last Pain:  Vitals:   04/09/18 1415  TempSrc:   PainSc: 0-No pain                 Ramses Klecka,W. EDMOND

## 2018-04-09 NOTE — Op Note (Signed)
OPERATIVE REPORT-TOTAL KNEE ARTHROPLASTY   Pre-operative diagnosis- Osteoarthritis  Right knee(s)  Post-operative diagnosis- Osteoarthritis Right knee(s)  Procedure-  Right  Total Knee Arthroplasty  Surgeon- Dione Plover. Ted Goodner, MD  Assistant- Ardeen Jourdain, PA-C   Anesthesia-  Adductor canal block and spinal  EBL-75 mL   Drains Hemovac  Tourniquet time- 30 minutes @ 086 mmHg  Complications- None  Condition-PACU - hemodynamically stable.   Brief Clinical Note  Joeanna Hailey [name] is a 77 y.o. year old female with end stage OA of her right knee with progressively worsening pain and dysfunction. She has constant pain, with activity and at rest and significant functional deficits with difficulties even with ADLs. She has had extensive non-op management including analgesics, injections of cortisone and viscosupplements, and home exercise program, but remains in significant pain with significant dysfunction.Radiographs show bone on bone arthritis medial and patellofemoral. She presents now for right Total Knee Arthroplasty.    Procedure in detail---   The patient is brought into the operating room and positioned supine on the operating table. After successful administration of  Adductor canal block and spinal,   a tourniquet is placed high on the  Right thigh(s) and the lower extremity is prepped and draped in the usual sterile fashion. Time out is performed by the operating team and then the  Right lower extremity is wrapped in Esmarch, knee flexed and the tourniquet inflated to 300 mmHg.       A midline incision is made with a ten blade through the subcutaneous tissue to the level of the extensor mechanism. A fresh blade is used to make a medial parapatellar arthrotomy. Soft tissue over the proximal medial tibia is subperiosteally elevated to the joint line with a knife and into the semimembranosus bursa with a Cobb elevator. Soft tissue over the proximal lateral tibia is elevated with  attention being paid to avoiding the patellar tendon on the tibial tubercle. The patella is everted, knee flexed 90 degrees and the ACL and PCL are removed. Findings are bone on bone medial and patellofemoral with severe patellar erosion        The drill is used to create a starting hole in the distal femur and the canal is thoroughly irrigated with sterile saline to remove the fatty contents. The 5 degree Right  valgus alignment guide is placed into the femoral canal and the distal femoral cutting block is pinned to remove 10 mm off the distal femur. Resection is made with an oscillating saw.      The tibia is subluxed forward and the menisci are removed. The extramedullary alignment guide is placed referencing proximally at the medial aspect of the tibial tubercle and distally along the second metatarsal axis and tibial crest. The block is pinned to remove 30mm off the more deficient medial  side. Resection is made with an oscillating saw. Size 3is the most appropriate size for the tibia and the proximal tibia is prepared with the modular drill and keel punch for that size.      The femoral sizing guide is placed and size 3 is most appropriate. Rotation is marked off the epicondylar axis and confirmed by creating a rectangular flexion gap at 90 degrees. The size 3 cutting block is pinned in this rotation and the anterior, posterior and chamfer cuts are made with the oscillating saw. The intercondylar block is then placed and that cut is made.      Trial size 3 tibial component, trial size 3 posterior stabilized femur and  a 10  mm posterior stabilized rotating platform insert trial is placed. Full extension is achieved with excellent varus/valgus and anterior/posterior balance throughout full range of motion. The patella is everted and thickness measured to be 18  mm. Free hand resection is taken to 11  mm, a 35 template is placed, lug holes are drilled, trial patella is placed, and it tracks normally.  Osteophytes are removed off the posterior femur with the trial in place. All trials are removed and the cut bone surfaces prepared with pulsatile lavage. Cement is mixed and once ready for implantation, the size 3 tibial implant, size  3 posterior stabilized femoral component, and the size 35 patella are cemented in place and the patella is held with the clamp. The trial insert is placed and the knee held in full extension. The Exparel (20 ml mixed with 60 ml saline) is injected into the extensor mechanism, posterior capsule, medial and lateral gutters and subcutaneous tissues.  All extruded cement is removed and once the cement is hard the permanent 10 mm posterior stabilized rotating platform insert is placed into the tibial tray.      The wound is copiously irrigated with saline solution and the extensor mechanism closed over a hemovac drain with #1 V-loc suture. The tourniquet is released for a total tourniquet time of 30  minutes. Flexion against gravity is 140 degrees and the patella tracks normally. Subcutaneous tissue is closed with 2.0 vicryl and subcuticular with running 4.0 Monocryl. The incision is cleaned and dried and steri-strips and a bulky sterile dressing are applied. The limb is placed into a knee immobilizer and the patient is awakened and transported to recovery in stable condition.      Please note that a surgical assistant was a medical necessity for this procedure in order to perform it in a safe and expeditious manner. Surgical assistant was necessary to retract the ligaments and vital neurovascular structures to prevent injury to them and also necessary for proper positioning of the limb to allow for anatomic placement of the prosthesis.   Dione Plover Marcella Dunnaway, MD    04/09/2018, 1:25 PM

## 2018-04-09 NOTE — Transfer of Care (Signed)
Immediate Anesthesia Transfer of Care Note  Patient: Hailey [name]  Procedure(s) Performed: RIGHT TOTAL KNEE ARTHROPLASTY (Right Knee)  Patient Location: PACU  Anesthesia Type:MAC, Regional and Spinal  Level of Consciousness: drowsy and patient cooperative  Airway & Oxygen Therapy: Patient Spontanous Breathing and Patient connected to face mask oxygen  Post-op Assessment: Report given to RN and Post -op Vital signs reviewed and stable  Post vital signs: Reviewed and stable  Last Vitals:  Vitals Value Taken Time  BP 154/66 04/09/2018  1:47 PM  Temp    Pulse 66 04/09/2018  1:48 PM  Resp 16 04/09/2018  1:48 PM  SpO2 99 % 04/09/2018  1:48 PM  Vitals shown include unvalidated device data.  Last Pain:  Vitals:   04/09/18 1022  TempSrc:   PainSc: 0-No pain      Patients Stated Pain Goal: 3 (06/14/61 4469)  Complications: No apparent anesthesia complications

## 2018-04-09 NOTE — Progress Notes (Signed)
AssistedDr. Edmond Fitzgerald with right, ultrasound guided, adductor canal block. Side rails up, monitors on throughout procedure. See vital signs in flow sheet. Tolerated Procedure well.  

## 2018-04-09 NOTE — Interval H&P Note (Signed)
History and Physical Interval Note:  04/09/2018 10:20 AM  Hailey [name]  has presented today for surgery, with the diagnosis of right knee osteoarthritis  The various methods of treatment have been discussed with the patient and family. After consideration of risks, benefits and other options for treatment, the patient has consented to  Procedure(s): RIGHT TOTAL KNEE ARTHROPLASTY (Right) as a surgical intervention .  The patient's history has been reviewed, patient examined, no change in status, stable for surgery.  I have reviewed the patient's chart and labs.  Questions were answered to the patient's satisfaction.     Pilar Plate Almeda Ezra

## 2018-04-09 NOTE — Anesthesia Procedure Notes (Signed)
Spinal  Patient location during procedure: OR Start time: 04/09/2018 12:23 PM End time: 04/09/2018 12:27 PM Staffing Anesthesiologist: Roderic Palau, MD Resident/CRNA: Gwyndolyn Saxon, CRNA Performed: resident/CRNA  Preanesthetic Checklist Completed: patient identified, site marked, surgical consent, pre-op evaluation, timeout performed, IV checked, risks and benefits discussed and monitors and equipment checked Spinal Block Patient position: sitting Prep: ChloraPrep Patient monitoring: heart rate, continuous pulse ox and blood pressure Approach: midline Location: L3-4 Injection technique: single-shot Needle Needle type: Pencan  Needle gauge: 24 G Needle length: 10 cm Assessment Sensory level: T10

## 2018-04-09 NOTE — Evaluation (Signed)
Physical Therapy Evaluation Patient Details Name: Hailey [name] MRN: [id number] DOB: 1941/05/16 Today's Date: 04/09/2018   History of Present Illness  77 YO female s/p R TKR on 9/16. PMH includes parkinson's disease, BPPV, shoulder replacement 2015, OA, LE edema, HTN, OP.   Clinical Impression   Pt s/p R TKR. Pt presents with R knee pain, anxiety about mobility, and difficulty performing all mobility tasks. Pt to benefit from acute PT. Pt able to take a few steps during stand pivot transfer to chair, requires min-mod assist for mobility tasks. Will continue to follow acutely and progress mobility as able. Per pt, pt is to d/c to sister-in-law's home to have assist, since pt lives alone.     Follow Up Recommendations Follow surgeon's recommendation for DC plan and follow-up therapies;Supervision for mobility/OOB(OPPT)    Equipment Recommendations  None recommended by PT    Recommendations for Other Services       Precautions / Restrictions Precautions Precautions: Knee;Fall Required Braces or Orthoses: Knee Immobilizer - Right Knee Immobilizer - Right: Discontinue once straight leg raise with < 10 degree lag;On when out of bed or walking Restrictions Weight Bearing Restrictions: No Other Position/Activity Restrictions: WBAT       Mobility  Bed Mobility Overal bed mobility: Needs Assistance Bed Mobility: Supine to Sit     Supine to sit: Min assist;HOB elevated     General bed mobility comments: Min assist for LE management and scooting in bed. Pt provided verbal cuing throughout so she would be informed and less anxious.   Transfers Overall transfer level: Needs assistance Equipment used: Rolling walker (2 wheeled) Transfers: Sit to/from [facility] Sit to Stand: Mod assist;From elevated surface Stand pivot transfers: Min assist       General transfer comment: Mod assist for rising, hip extension, and steadying upon standing. Pt with posterior lean  initially, tactile and verbal cuing/facilitation to correct. Pt able to weight shift L and R. Pt anxious during stand pivot transfer to chair, asking PT to have two hand on the pt at all times, worrying about leaning too far forwards/backwards. Pt assured that she was safe repeatedly. Upon sitting, PT noticed pt's hemovac tubing was unplugged from hemovac. Minimal amount of blood leaked, cleaned by PT. RN notified, PT plugged tubing back into hemovac, and RN reset hemovac.   Ambulation/Gait                Stairs            Wheelchair Mobility    Modified Rankin (Stroke Patients Only)       Balance Overall balance assessment: Needs assistance Sitting-balance support: Bilateral upper extremity supported;Feet unsupported Sitting balance-Leahy Scale: Fair       Standing balance-Leahy Scale: Poor Standing balance comment: relies heavily on RW and PT support for balance                              Pertinent Vitals/Pain Pain Assessment: 0-10 Pain Score: 5  Pain Location: R knee  Pain Descriptors / Indicators: Sore Pain Intervention(s): Limited activity within patient's tolerance;Monitored during session;Repositioned;Ice applied    Home Living Family/patient expects to be discharged to:: Private residence Living Arrangements: Other relatives(Pt and pt's sister-in-law state pt is to be d/ced to sister-in-law's home for a few days following d/c) Available Help at Discharge: Family;Available 24 hours/day Type of Home: House Home Access: Stairs to enter Entrance Stairs-Rails: None Entrance Stairs-Number of Steps: 1  Home Layout: One level Home Equipment: Shower seat;Bedside commode;Walker - 2 wheels;Cane - single point      Prior Function Level of Independence: Independent               Hand Dominance   Dominant Hand: Right    Extremity/Trunk Assessment   Upper Extremity Assessment Upper Extremity Assessment: Overall WFL for tasks assessed     Lower Extremity Assessment Lower Extremity Assessment: Generalized weakness    Cervical / Trunk Assessment Cervical / Trunk Assessment: Normal  Communication   Communication: No difficulties  Cognition Arousal/Alertness: Awake/alert Behavior During Therapy: Anxious Overall Cognitive Status: Within Functional Limits for tasks assessed                                 General Comments: Pt with anxiety throughout session, moved slowly to accomodate this.       General Comments      Exercises Total Joint Exercises Ankle Circles/Pumps: AROM;Both;10 reps;Seated   Assessment/Plan    PT Assessment Patient needs continued PT services  PT Problem List Decreased strength;Pain;Decreased range of motion;Decreased activity tolerance;Decreased knowledge of use of DME;Decreased balance;Decreased safety awareness;Decreased mobility       PT Treatment Interventions DME instruction;Therapeutic activities;Gait training;Therapeutic exercise;Patient/family education;Stair training;Balance training;Functional mobility training    PT Goals (Current goals can be found in the Care Plan section)  Acute Rehab PT Goals PT Goal Formulation: With patient Time For Goal Achievement: 04/23/18 Potential to Achieve Goals: Good    Frequency 7X/week   Barriers to discharge        Co-evaluation               AM-PAC PT "6 Clicks" Daily Activity  Outcome Measure Difficulty turning over in bed (including adjusting bedclothes, sheets and blankets)?: Unable Difficulty moving from lying on back to sitting on the side of the bed? : Unable Difficulty sitting down on and standing up from a chair with arms (e.g., wheelchair, bedside commode, etc,.)?: Unable Help needed moving to and from a bed to chair (including a wheelchair)?: A Little Help needed walking in hospital room?: A Lot Help needed climbing 3-5 steps with a railing? : A Lot 6 Click Score: 10    End of Session Equipment  Utilized During Treatment: Gait belt;Right knee immobilizer Activity Tolerance: Patient tolerated treatment well;Other (comment)(anxiety is limiting factor) Patient left: in chair;with call bell/phone within reach;with family/visitor present;with SCD's reapplied(Pt states she will not get up without physical assist) Nurse Communication: Mobility status PT Visit Diagnosis: Other abnormalities of gait and mobility (R26.89);Difficulty in walking, not elsewhere classified (R26.2)    Time: 5681-2751 PT Time Calculation (min) (ACUTE ONLY): 32 min   Charges:   PT Evaluation $PT Eval Low Complexity: 1 Low PT Treatments $Therapeutic Activity: 8-22 mins        Julien Girt, PT Acute Rehabilitation Services Pager [phone]  Office [phone]  Tayvien Kane D Elonda Husky 04/09/2018, 8:56 PM

## 2018-04-09 NOTE — Anesthesia Procedure Notes (Deleted)
Spinal

## 2018-04-10 ENCOUNTER — Encounter (HOSPITAL_COMMUNITY): Payer: Self-pay | Admitting: Orthopedic Surgery

## 2018-04-10 LAB — CBC
HCT: 35.5 % — ABNORMAL LOW (ref 36.0–46.0)
Hemoglobin: 11.5 g/dL — ABNORMAL LOW (ref 12.0–15.0)
MCH: 30.7 pg (ref 26.0–34.0)
MCHC: 32.4 g/dL (ref 30.0–36.0)
MCV: 94.7 fL (ref 78.0–100.0)
PLATELETS: 294 10*3/uL (ref 150–400)
RBC: 3.75 MIL/uL — AB (ref 3.87–5.11)
RDW: 13.8 % (ref 11.5–15.5)
WBC: 13.1 10*3/uL — AB (ref 4.0–10.5)

## 2018-04-10 LAB — BASIC METABOLIC PANEL
ANION GAP: 11 (ref 5–15)
BUN: 20 mg/dL (ref 8–23)
CO2: 25 mmol/L (ref 22–32)
Calcium: 9.2 mg/dL (ref 8.9–10.3)
Chloride: 106 mmol/L (ref 98–111)
Creatinine, Ser: 0.9 mg/dL (ref 0.44–1.00)
GFR calc Af Amer: >60 mL/min (ref >60)
Glucose, Bld: 140 mg/dL — ABNORMAL HIGH (ref 70–99)
POTASSIUM: 4.7 mmol/L (ref 3.5–5.1)
SODIUM: 142 mmol/L (ref 135–145)

## 2018-04-10 NOTE — Progress Notes (Signed)
Physical Therapy Treatment Patient Details Name: Hailey [name] MRN: [id number] DOB: [birth year] Today's Date: 04/10/2018    History of Present Illness 77 YO female s/p R TKR on 9/16. PMH includes parkinson's disease, BPPV, shoulder replacement 2015, OA, LE edema, HTN, OP.     PT Comments    POD # 1 am session Applied KI and instructed on use for amb and stairs.  Assisted to EOB required increased time and use of pad to complete scooting to EOB.  Pt very slow moving.  Difficulty self performing B LE weakness and R shoulder Hx.  General transfer comment: required 75% VC's on proper hand placement, forward lean and to back up L foot prior to stand.  pt present with difficulty due to "other knee is bad" and Hx   Severe posterior lean/therapist recovered.  Assisted with amb a limited distance due to weakness.  Unsteady gait due to "other knee is bad" as well as R shoulder surgery Hx.  Pt present with intial posterior lean with very slow self correction.  HIGH FALL RISK.   Sister in law assisted with IV pole and recliner following.      Follow Up Recommendations  Follow surgeon's recommendation for DC plan and follow-up therapies;Supervision for mobility/OOB(per chart review pt is to attend OP.  That's goin g to be VERY difficult for her to achieve due to her low mobility level.)     Equipment Recommendations  None recommended by PT    Recommendations for Other Services       Precautions / Restrictions Precautions Precautions: Knee;Fall Precaution Comments: instructed on KI use for amb and stairs Required Braces or Orthoses: Knee Immobilizer - Right Knee Immobilizer - Right: Discontinue once straight leg raise with < 10 degree lag;On when out of bed or walking Restrictions Weight Bearing Restrictions: No Other Position/Activity Restrictions: WBAT     Mobility  Bed Mobility Overal bed mobility: Needs Assistance Bed Mobility: Supine to Sit     Supine to sit: Max assist     General  bed mobility comments: HOB elevated and use of bed pad to complete scooting to EOB  Transfers Overall transfer level: Needs assistance Equipment used: Rolling walker (2 wheeled) Transfers: Sit to/from [facility] Sit to Stand: Mod assist;From elevated surface         General transfer comment: required 75% VC's on proper hand placement, forward lean and to back up L foot prior to stand.  pt present with difficulty due to "other knee is bad" and Hx    Ambulation/Gait Ambulation/Gait assistance: Min assist;Mod assist Gait Distance (Feet): 18 Feet Assistive device: Rolling walker (2 wheeled) Gait Pattern/deviations: Step-to pattern Gait velocity: decreased x 3   General Gait Details: very limited distance due to fatigue and unsteady gait due to "other knee is bad" as well as R shoulder surgery Hx.  Pt present with intial posterior lean with very slow self correction.  HIGH FALL RISK.   Sister in law assisted with IV pole and recliner following.     Stairs             Wheelchair Mobility    Modified Rankin (Stroke Patients Only)       Balance                                            Cognition Arousal/Alertness: Awake/alert Behavior During Therapy: [facility]  for tasks assessed/performed Overall Cognitive Status: Within Functional Limits for tasks assessed                                 General Comments: slow moving, required increased time and positive reinforcement      Exercises   Total Knee Replacement TE's 10 reps B LE ankle pumps 10 reps towel squeezes 10 reps knee presses 10 reps heel slides  10 reps SLR's 10 reps ABD Followed by ICE     General Comments        Pertinent Vitals/Pain Pain Assessment: 0-10 Pain Score: 4  Pain Location: R knee  Pain Descriptors / Indicators: Sore;Grimacing;Operative site guarding Pain Intervention(s): Monitored during session;Repositioned;Ice applied    Home Living                       Prior Function            PT Goals (current goals can now be found in the care plan section) Progress towards PT goals: Progressing toward goals    Frequency    7X/week      PT Plan Current plan remains appropriate    Co-evaluation              AM-PAC PT "6 Clicks" Daily Activity  Outcome Measure  Difficulty turning over in bed (including adjusting bedclothes, sheets and blankets)?: Unable Difficulty moving from lying on back to sitting on the side of the bed? : Unable Difficulty sitting down on and standing up from a chair with arms (e.g., wheelchair, bedside commode, etc,.)?: Unable Help needed moving to and from a bed to chair (including a wheelchair)?: Total Help needed walking in hospital room?: Total Help needed climbing 3-5 steps with a railing? : Total 6 Click Score: 6    End of Session Equipment Utilized During Treatment: Gait belt;Right knee immobilizer Activity Tolerance: Patient limited by fatigue Patient left: in chair;with call bell/phone within reach;with family/visitor present Nurse Communication: Mobility status PT Visit Diagnosis: Other abnormalities of gait and mobility (R26.89);Difficulty in walking, not elsewhere classified (R26.2)     Time: 4462-8638 PT Time Calculation (min) (ACUTE ONLY): 40 min  Charges:  $Gait Training: 8-22 mins $Therapeutic Exercise: 8-22 mins $Therapeutic Activity: 8-22 mins                     Rica Koyanagi  PTA Acute  Rehabilitation Services Pager      [phone] Office      [phone]

## 2018-04-10 NOTE — Progress Notes (Signed)
   Subjective: 1 Day Post-Op Procedure(s) (LRB): RIGHT TOTAL KNEE ARTHROPLASTY (Right) Patient reports pain as moderate.   Patient seen in rounds by Dr. Wynelle Link. Patient is well, and has had no acute complaints or problems other than pain in the right knee. Denies chest pain, SOB or calf pain. Foley catheter removed this AM. No issues overnight.  We will continue therapy today.   Objective: Vital signs in last 24 hours: Temp:  [97.4 F (36.3 C)-98.1 F (36.7 C)] 97.5 F (36.4 C) (09/17 0526) Pulse Rate:  [55-72] 65 (09/17 0526) Resp:  [13-18] 16 (09/17 0132) BP: (146-186)/(56-82) 156/74 (09/17 0526) SpO2:  [95 %-100 %] 95 % (09/17 0526) Weight:  [69.4 kg] 69.4 kg (09/16 1022)  Intake/Output from previous day:  Intake/Output Summary (Last 24 hours) at 04/10/2018 0749 Last data filed at 04/10/2018 0600 Gross per 24 hour  Intake 3445 ml  Output 2830 ml  Net 615 ml    Labs: Recent Labs    04/10/18 0512  HGB 11.5*   Recent Labs    04/10/18 0512  WBC 13.1*  RBC 3.75*  HCT 35.5*  PLT 294   Recent Labs    04/10/18 0512  NA 142  K 4.7  CL 106  CO2 25  BUN 20  CREATININE 0.90  GLUCOSE 140*  CALCIUM 9.2   Exam: General - Patient is Alert and Oriented Extremity - Neurologically intact Neurovascular intact Sensation intact distally Dorsiflexion/Plantar flexion intact Dressing - dressing C/D/I Motor Function - intact, moving foot and toes well on exam.   Past Medical History:  Diagnosis Date  . 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 Bilateral leg edema    takes lasix as needed for dependent edema   . H/O exercise stress test    4-5 yrs. ago, told that it was wnl , done at [place] office   . Hypertension   . Osteoporosis   . Parkinson disease ([place])     Assessment/Plan: 1 Day Post-Op Procedure(s) (LRB): RIGHT TOTAL KNEE ARTHROPLASTY (Right) Principal Problem:   OA (osteoarthritis) of knee  Estimated body mass index is 25.46 kg/m as calculated from  the following:   Height as of this encounter: 5\' 5"  (1.651 m).   Weight as of this encounter: 69.4 kg. Advance diet Up with therapy  Anticipated LOS equal to or greater than 2 midnights due to - Age 35 and older with one or more of the following:  - Obesity  - Expected need for hospital services (PT, OT, Nursing) required for safe  discharge  - Anticipated need for postoperative skilled nursing care or inpatient rehab  - Active co-morbidities: None OR   - Unanticipated findings during/Post Surgery: None  - Patient is a high risk of re-admission due to: None    DVT Prophylaxis - Aspirin Weight bearing as tolerated. D/C O2 and pulse ox and try on room air. Hemovac pulled without difficulty, will continue therapy today.  Plan is to go Home after hospital stay. Possible discharge tomorrow if meeting goals with OP therapy.  [name], PA-C Orthopedic Surgery 04/10/2018, 7:49 AM

## 2018-04-10 NOTE — Progress Notes (Signed)
Physical Therapy Treatment Patient Details Name: Hailey [name] MRN: [id number] DOB: 02-25-1941 Today's Date: 04/10/2018    History of Present Illness 77 YO female s/p R TKR on 9/16. PMH includes parkinson's disease, BPPV, shoulder replacement 2015, OA, LE edema, HTN, OP.     PT Comments    POD # 1 pm session Assisted with amb a second time required + 2 assist for safety.   Unsteady gait and limited distance due to "bad other knee".   Pt progressing slowly with her mobility and most likely will need  an extra hospital day prior to safely D/C to home.  May also need HH vs OP PT    Follow Up Recommendations  Follow surgeon's recommendation for DC plan and follow-up therapies;Supervision for mobility/OOB(per chart review pt is to attend OP.  That's going to be VERY difficult for her to achieve due to her low mobility level.)     Equipment Recommendations  None recommended by PT    Recommendations for Other Services       Precautions / Restrictions Precautions Precautions: Knee;Fall Precaution Comments: instructed on KI use for amb and stairs Required Braces or Orthoses: Knee Immobilizer - Right Knee Immobilizer - Right: Discontinue once straight leg raise with < 10 degree lag;On when out of bed or walking Restrictions Weight Bearing Restrictions: No Other Position/Activity Restrictions: WBAT     Mobility  Bed Mobility Overal bed mobility: Needs Assistance Bed Mobility: Sit to Supine     Supine to sit: Max assist Sit to supine: +2 for physical assistance   General bed mobility comments: assisted back to bed + 2 assist scoot to [facility] [place]  Transfers Overall transfer level: Needs assistance Equipment used: Rolling walker (2 wheeled) Transfers: Sit to/from Stand Sit to Stand: Max assist;+2 physical assistance;+2 safety/equipment Stand pivot transfers: Mod assist       General transfer comment: pt was unable to ride from lower level recliner and required + 2 assist.  Assisted  back to bed + 2 assist   Ambulation/Gait Ambulation/Gait assistance: Min assist;Mod assist Gait Distance (Feet): 22 Feet Assistive device: Rolling walker (2 wheeled) Gait Pattern/deviations: Step-to pattern Gait velocity: decreased x 3   General Gait Details: required increased time and + 2 assist for safety.  Unsteady gait and limited distance due to "bad other knee".     Stairs             Wheelchair Mobility    Modified Rankin (Stroke Patients Only)       Balance                                            Cognition Arousal/Alertness: Awake/alert Behavior During Therapy: WFL for tasks assessed/performed Overall Cognitive Status: Within Functional Limits for tasks assessed                                 General Comments: slow moving, required increased time and positive reinforcement      Exercises      General Comments        Pertinent Vitals/Pain Pain Assessment: 0-10 Pain Score: 4  Pain Location: R knee  Pain Descriptors / Indicators: Sore;Grimacing;Operative site guarding Pain Intervention(s): Monitored during session;Repositioned;Ice applied    Home Living  Prior Function            PT Goals (current goals can now be found in the care plan section) Progress towards PT goals: Progressing toward goals    Frequency    7X/week      PT Plan Current plan remains appropriate    Co-evaluation              AM-PAC PT "6 Clicks" Daily Activity  Outcome Measure  Difficulty turning over in bed (including adjusting bedclothes, sheets and blankets)?: Unable Difficulty moving from lying on back to sitting on the side of the bed? : Unable Difficulty sitting down on and standing up from a chair with arms (e.g., wheelchair, bedside commode, etc,.)?: Unable Help needed moving to and from a bed to chair (including a wheelchair)?: Total Help needed walking in hospital room?:  Total Help needed climbing 3-5 steps with a railing? : Total 6 Click Score: 6    End of Session Equipment Utilized During Treatment: Gait belt;Right knee immobilizer Activity Tolerance: Patient limited by fatigue Patient left: in bed;with call bell/phone within reach Nurse Communication: Mobility status PT Visit Diagnosis: Other abnormalities of gait and mobility (R26.89);Difficulty in walking, not elsewhere classified (R26.2)     Time: 1305-1330 PT Time Calculation (min) (ACUTE ONLY): 25 min  Charges:  $Gait Training: 8-22 mins $Therapeutic Activity: 8-22 mins                     Rica Koyanagi  PTA Acute  Rehabilitation Services Pager      [phone] Office      [phone]

## 2018-04-10 NOTE — Care Management Note (Signed)
Case Management Note  Patient Details  Name: Hailey [name] MRN: [id number] Date of Birth: 1941-04-19  Subjective/Objective:      Spoke with patient at bedside. Confirmed plan for OP PT, already arranged. Has RW and 3n1. [phone]              Action/Plan:   Expected Discharge Date:  04/11/18               Expected Discharge Plan:  OP Rehab  In-House Referral:  NA  Discharge planning Services  CM Consult  Post Acute Care Choice:  NA Choice offered to:  NA  DME Arranged:  N/A DME Agency:  NA  HH Arranged:  NA HH Agency:  NA  Status of Service:  Completed, signed off  If discussed at [place] of Stay Meetings, dates discussed:    Additional Comments:  Guadalupe Maple, RN 04/10/2018, 12:02 PM

## 2018-04-11 LAB — BASIC METABOLIC PANEL
Anion gap: 9 (ref 5–15)
BUN: 26 mg/dL — AB (ref 8–23)
CALCIUM: 9 mg/dL (ref 8.9–10.3)
CO2: 25 mmol/L (ref 22–32)
Chloride: 104 mmol/L (ref 98–111)
Creatinine, Ser: 0.88 mg/dL (ref 0.44–1.00)
GFR calc Af Amer: >60 mL/min (ref >60)
GFR calc non Af Amer: >60 mL/min (ref >60)
GLUCOSE: 110 mg/dL — AB (ref 70–99)
POTASSIUM: 4.2 mmol/L (ref 3.5–5.1)
Sodium: 138 mmol/L (ref 135–145)

## 2018-04-11 LAB — CBC
HEMATOCRIT: 33.4 % — AB (ref 36.0–46.0)
Hemoglobin: 10.8 g/dL — ABNORMAL LOW (ref 12.0–15.0)
MCH: 30.5 pg (ref 26.0–34.0)
MCHC: 32.3 g/dL (ref 30.0–36.0)
MCV: 94.4 fL (ref 78.0–100.0)
PLATELETS: 281 10*3/uL (ref 150–400)
RBC: 3.54 MIL/uL — ABNORMAL LOW (ref 3.87–5.11)
RDW: 13.9 % (ref 11.5–15.5)
WBC: 12.8 10*3/uL — ABNORMAL HIGH (ref 4.0–10.5)

## 2018-04-11 MED ORDER — HYDROMORPHONE HCL 2 MG PO TABS: 2.0000 mg | ORAL_TABLET | ORAL | Status: DC | PRN

## 2018-04-11 NOTE — Progress Notes (Signed)
   Subjective: 2 Days Post-Op Procedure(s) (LRB): RIGHT TOTAL KNEE ARTHROPLASTY (Right) Patient reports pain as moderate.   Patient seen in rounds for Dr. Wynelle Link. Patient is having issues with confusion per family member. States Ms. Melkonian did not know who she was for a portion of the night. Believes this is related to pain medication, as this is not an issue for her at home. Denies chest pain, SOB or calf pain. Voiding without difficulty and positive flatus. She is alert and oriented this AM during rounds. Plan is to go Home after hospital stay.  Objective: Vital signs in last 24 hours: Temp:  [97.8 F (36.6 C)-98.3 F (36.8 C)] 98.1 F (36.7 C) (09/18 0615) Pulse Rate:  [63-72] 65 (09/18 0615) Resp:  [15-20] 20 (09/18 0615) BP: (124-160)/(64-69) 160/64 (09/18 0615) SpO2:  [95 %-99 %] 95 % (09/18 0615)  Intake/Output from previous day:  Intake/Output Summary (Last 24 hours) at 04/11/2018 0836 Last data filed at 04/11/2018 0615 Gross per 24 hour  Intake 1680 ml  Output 1100 ml  Net 580 ml    Labs: Recent Labs    04/10/18 0512 04/11/18 0454  HGB 11.5* 10.8*   Recent Labs    04/10/18 0512 04/11/18 0454  WBC 13.1* 12.8*  RBC 3.75* 3.54*  HCT 35.5* 33.4*  PLT 294 281   Recent Labs    04/10/18 0512 04/11/18 0454  NA 142 138  K 4.7 4.2  CL 106 104  CO2 25 25  BUN 20 26*  CREATININE 0.90 0.88  GLUCOSE 140* 110*  CALCIUM 9.2 9.0   Exam: General - Patient is Alert and Oriented Extremity - Neurologically intact Neurovascular intact Sensation intact distally Dorsiflexion/Plantar flexion intact Dressing/Incision - clean, dry, no drainage Motor Function - intact, moving foot and toes well on exam.   Past Medical History:  Diagnosis Date  . 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 Bilateral leg edema    takes lasix as needed for dependent edema   . H/O exercise stress test    4-5 yrs. ago, told that it was wnl , done at [place] office   . Hypertension   .  Osteoporosis   . Parkinson disease ([place])     Assessment/Plan: 2 Days Post-Op Procedure(s) (LRB): RIGHT TOTAL KNEE ARTHROPLASTY (Right) Principal Problem:   OA (osteoarthritis) of knee  Estimated body mass index is 25.46 kg/m as calculated from the following:   Height as of this encounter: 5\' 5"  (1.651 m).   Weight as of this encounter: 69.4 kg. Up with therapy  DVT Prophylaxis - Aspirin Weight-bearing as tolerated  Pain medication of oxycodone switched to dilaudid tablets. Will continue to monitor to see whether confusion improves. She is having slower progression with physical therapy, so we will plan on possible discharge tomorrow if she is meeting her goals and having no further problems with disorientation.  [name], PA-C Orthopedic Surgery 04/11/2018, 8:36 AM

## 2018-04-11 NOTE — Progress Notes (Signed)
Physical Therapy Treatment Patient Details Name: Hailey [name] MRN: [id number] DOB: May 18, 1941 Today's Date: 04/11/2018    History of Present Illness 77 YO female s/p R TKR on 9/16. PMH includes parkinson's disease, BPPV, shoulder replacement 2015, OA, LE edema, HTN, OP.     PT Comments    Patient progressing with mobility this session able to ambulate in hallway and participate with less assist for all transitions.  Feel she should be able to go home, but again, may struggle to get to outpatient PT and may benefit from initially starting with HHPT.  Will continue to follow.   Follow Up Recommendations  Follow surgeon's recommendation for DC plan and follow-up therapies;Supervision for mobility/OOB     Equipment Recommendations  None recommended by PT    Recommendations for Other Services       Precautions / Restrictions Precautions Precautions: Knee;Fall Required Braces or Orthoses: Knee Immobilizer - Right(not utilized this session) Restrictions Other Position/Activity Restrictions: WBAT     Mobility  Bed Mobility Overal bed mobility: Needs Assistance Bed Mobility: Supine to Sit     Supine to sit: HOB elevated;Min assist     General bed mobility comments: assist for scooting   Transfers Overall transfer level: Needs assistance Equipment used: Rolling walker (2 wheeled) Transfers: Sit to/from Stand   Stand pivot transfers: Mod assist       General transfer comment: lifting help from EOB, 3:1 with cues for hand placement   Ambulation/Gait Ambulation/Gait assistance: Min assist Gait Distance (Feet): 120 Feet Assistive device: Rolling walker (2 wheeled) Gait Pattern/deviations: Step-through pattern;Decreased stride length;Antalgic;Trunk flexed     General Gait Details: cues for posture, forward gaze and assist for stability, slow pace   Stairs             Wheelchair Mobility    Modified Rankin (Stroke Patients Only)       Balance Overall  balance assessment: Needs assistance Sitting-balance support: Feet supported Sitting balance-Leahy Scale: Fair     Standing balance support: Bilateral upper extremity supported Standing balance-Leahy Scale: Poor Standing balance comment: UE support for balance, did perform hygiene after tolieting, but min A for balance                            Cognition Arousal/Alertness: Awake/alert Behavior During Therapy: WFL for tasks assessed/performed Overall Cognitive Status: Within Functional Limits for tasks assessed                                        Exercises Total Joint Exercises Ankle Circles/Pumps: AROM;Both;10 reps;Seated Quad Sets: AROM;10 reps;Right;Supine Short Arc Quad: AROM;10 reps;Right;Supine Heel Slides: AROM;AAROM;10 reps;Right;Supine Hip ABduction/ADduction: AROM;10 reps;Right;Supine Straight Leg Raises: AROM;10 reps;Right;Supine    General Comments General comments (skin integrity, edema, etc.): daughter present during session      Pertinent Vitals/Pain Pain Assessment: Faces Faces Pain Scale: Hurts little more Pain Location: R knee  Pain Descriptors / Indicators: Sore;Grimacing;Operative site guarding Pain Intervention(s): Monitored during session;Repositioned;Ice applied    Home Living                      Prior Function            PT Goals (current goals can now be found in the care plan section) Progress towards PT goals: Progressing toward goals    Frequency    7X/week  PT Plan Current plan remains appropriate    Co-evaluation              AM-PAC PT "6 Clicks" Daily Activity  Outcome Measure  Difficulty turning over in bed (including adjusting bedclothes, sheets and blankets)?: Unable Difficulty moving from lying on back to sitting on the side of the bed? : Unable Difficulty sitting down on and standing up from a chair with arms (e.g., wheelchair, bedside commode, etc,.)?: Unable Help  needed moving to and from a bed to chair (including a wheelchair)?: A Little Help needed walking in hospital room?: A Little Help needed climbing 3-5 steps with a railing? : A Little 6 Click Score: 12    End of Session Equipment Utilized During Treatment: Gait belt Activity Tolerance: Patient tolerated treatment well Patient left: with call bell/phone within reach;in chair;with family/visitor present   PT Visit Diagnosis: Other abnormalities of gait and mobility (R26.89);Difficulty in walking, not elsewhere classified (R26.2)     Time: 6381-7711 PT Time Calculation (min) (ACUTE ONLY): 37 min  Charges:  $Gait Training: 8-22 mins $Therapeutic Exercise: 8-22 mins                     Magda Kiel, [facility] [phone] 04/11/2018    Reginia Naas 04/11/2018, 10:59 AM

## 2018-04-11 NOTE — Progress Notes (Signed)
Physical Therapy Treatment Patient Details Name: Hailey [name] MRN: [id number] DOB: 1941-01-27 Today's Date: 04/11/2018    History of Present Illness 77 YO female s/p R TKR on 9/16. PMH includes parkinson's disease, BPPV, shoulder replacement 2015, OA, LE edema, HTN, OP.     PT Comments    Patient progressing and attempting stair training, though with increased pain and stiffness due to Parkinson's medications just given as well.  Feel she will continue to benefit from skilled PT prior to d/c home with daughter to assist and follow up PT as indicated per MD.   Follow Up Recommendations  Follow surgeon's recommendation for DC plan and follow-up therapies;Supervision for mobility/OOB     Equipment Recommendations  None recommended by PT    Recommendations for Other Services       Precautions / Restrictions Precautions Precautions: Knee;Fall Required Braces or Orthoses: Knee Immobilizer - Right Restrictions Other Position/Activity Restrictions: WBAT     Mobility  Bed Mobility Overal bed mobility: Needs Assistance Bed Mobility: Supine to Sit     Supine to sit: HOB elevated;Mod assist     General bed mobility comments: assist for scooting/guiding legs off bed and to supine assist for legs onto bed  Transfers Overall transfer level: Needs assistance Equipment used: Rolling walker (2 wheeled) Transfers: Sit to/from Stand Sit to Stand: Mod assist         General transfer comment: lifting help from EOB, audible crepitus L knee with transfer  Ambulation/Gait Ambulation/Gait assistance: Min assist Gait Distance (Feet): 90 Feet Assistive device: Rolling walker (2 wheeled) Gait Pattern/deviations: Step-to pattern;Decreased stride length;Antalgic;Trunk flexed     General Gait Details: slow shuffling steps, cues for posture, assist for balance and safety   Stairs Stairs: Yes Stairs assistance: (demo only) Stair Management: With walker;Backwards Number of Stairs:  1 General stair comments: demonstrated one step up to 6" step, pt has a brick height into daughter's home; pt prefers to practice in the morning   Wheelchair Mobility    Modified Rankin (Stroke Patients Only)       Balance Overall balance assessment: Needs assistance Sitting-balance support: Feet supported Sitting balance-Leahy Scale: Fair     Standing balance support: Bilateral upper extremity supported Standing balance-Leahy Scale: Poor                              Cognition Arousal/Alertness: Awake/alert Behavior During Therapy: WFL for tasks assessed/performed Overall Cognitive Status: Within Functional Limits for tasks assessed                                        Exercises Total Joint Exercises Ankle Circles/Pumps: AROM;Both;10 reps;Seated Quad Sets: AROM;10 reps;Right;Supine Heel Slides: AROM;AAROM;10 reps;Right;Seated Hip ABduction/ADduction: AROM;10 reps;Right;Supine Straight Leg Raises: AROM;10 reps;Right;Supine Long Arc Quad: AROM;10 reps;Right;Seated Goniometric ROM: approx -15 to 50    General Comments        Pertinent Vitals/Pain Faces Pain Scale: Hurts little more Pain Location: R knee  Pain Descriptors / Indicators: Sore;Grimacing;Operative site guarding Pain Intervention(s): Monitored during session;Repositioned;Ice applied    Home Living                      Prior Function            PT Goals (current goals can now be found in the care plan section) Progress towards PT goals:  Progressing toward goals    Frequency    7X/week      PT Plan Current plan remains appropriate    Co-evaluation              AM-PAC PT "6 Clicks" Daily Activity  Outcome Measure  Difficulty turning over in bed (including adjusting bedclothes, sheets and blankets)?: Unable Difficulty moving from lying on back to sitting on the side of the bed? : Unable Difficulty sitting down on and standing up from a chair  with arms (e.g., wheelchair, bedside commode, etc,.)?: Unable Help needed moving to and from a bed to chair (including a wheelchair)?: A Little Help needed walking in hospital room?: A Little Help needed climbing 3-5 steps with a railing? : A Lot 6 Click Score: 11    End of Session Equipment Utilized During Treatment: Gait belt Activity Tolerance: Patient tolerated treatment well Patient left: in bed;with call bell/phone within reach;with family/visitor present   PT Visit Diagnosis: Other abnormalities of gait and mobility (R26.89);Difficulty in walking, not elsewhere classified (R26.2)     Time: 2683-4196 PT Time Calculation (min) (ACUTE ONLY): 33 min  Charges:  $Gait Training: 8-22 mins $Therapeutic Exercise: 8-22 mins                     Magda Kiel, [facility] [phone] 04/11/2018    Reginia Naas 04/11/2018, 4:44 PM

## 2018-04-12 LAB — CBC
HEMATOCRIT: 31.1 % — AB (ref 36.0–46.0)
Hemoglobin: 10.2 g/dL — ABNORMAL LOW (ref 12.0–15.0)
MCH: 30.9 pg (ref 26.0–34.0)
MCHC: 32.8 g/dL (ref 30.0–36.0)
MCV: 94.2 fL (ref 78.0–100.0)
PLATELETS: 252 10*3/uL (ref 150–400)
RBC: 3.3 MIL/uL — ABNORMAL LOW (ref 3.87–5.11)
RDW: 14.1 % (ref 11.5–15.5)
WBC: 8.9 10*3/uL (ref 4.0–10.5)

## 2018-04-12 MED ORDER — ASPIRIN 325 MG PO TBEC: 325.0000 mg | DELAYED_RELEASE_TABLET | Freq: Two times a day (BID) | ORAL | 0 refills | Status: AC

## 2018-04-12 MED ORDER — METHOCARBAMOL 500 MG PO TABS: 500.0000 mg | ORAL_TABLET | Freq: Four times a day (QID) | ORAL | 0 refills | Status: DC | PRN

## 2018-04-12 MED ORDER — TRAMADOL HCL 50 MG PO TABS: 50.0000 mg | ORAL_TABLET | Freq: Four times a day (QID) | ORAL | 0 refills | Status: DC | PRN

## 2018-04-12 NOTE — Progress Notes (Signed)
No confusion thus far this shift. Tramadol PRN administered for pain. Ice applied to leg. Vitals stable. Sister-in-law at bedside.

## 2018-04-12 NOTE — Progress Notes (Signed)
   Subjective: 3 Days Post-Op Procedure(s) (LRB): RIGHT TOTAL KNEE ARTHROPLASTY (Right) Patient reports pain as mild.   Patient seen in rounds by Dr. Wynelle Link. Patient is well, and has had no acute complaints or problems other than discomfort in the right knee. Confusion has improved since yesterday with discontinuation of the oxycodone. Originally had dilaudid PO ordered, however she was managing her pain adequately with only tramadol, so dilaudid order discontinued. She did well ambulating with therapy yesterday. Voiding without difficulty and positive flatus. Denies chest pain, SOB or calf pain.  Objective: Vital signs in last 24 hours: Temp:  [97.5 F (36.4 C)-98.7 F (37.1 C)] 97.5 F (36.4 C) (09/19 0527) Pulse Rate:  [66-72] 66 (09/19 0527) Resp:  [15-18] 15 (09/19 0527) BP: (108-167)/(49-64) 167/62 (09/19 0527) SpO2:  [94 %-98 %] 97 % (09/19 0527)  Intake/Output from previous day:  Intake/Output Summary (Last 24 hours) at 04/12/2018 0804 Last data filed at 04/12/2018 0630 Gross per 24 hour  Intake 960 ml  Output 600 ml  Net 360 ml    Labs: Recent Labs    04/10/18 0512 04/11/18 0454 04/12/18 0418  HGB 11.5* 10.8* 10.2*   Recent Labs    04/11/18 0454 04/12/18 0418  WBC 12.8* 8.9  RBC 3.54* 3.30*  HCT 33.4* 31.1*  PLT 281 252   Recent Labs    04/10/18 0512 04/11/18 0454  NA 142 138  K 4.7 4.2  CL 106 104  CO2 25 25  BUN 20 26*  CREATININE 0.90 0.88  GLUCOSE 140* 110*  CALCIUM 9.2 9.0   Exam: General - Patient is Alert and Oriented Extremity - Neurologically intact Neurovascular intact Sensation intact distally Dorsiflexion/Plantar flexion intact Dressing/Incision - clean, dry, no drainage Motor Function - intact, moving foot and toes well on exam.   Past Medical History:  Diagnosis Date  . 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 Bilateral leg edema    takes lasix as needed for dependent edema   . H/O exercise stress test    4-5 yrs. ago, told  that it was wnl , done at [place] office   . Hypertension   . Osteoporosis   . Parkinson disease ([place])     Assessment/Plan: 3 Days Post-Op Procedure(s) (LRB): RIGHT TOTAL KNEE ARTHROPLASTY (Right) Principal Problem:   OA (osteoarthritis) of knee  Estimated body mass index is 25.46 kg/m as calculated from the following:   Height as of this encounter: 5\' 5"  (1.651 m).   Weight as of this encounter: 69.4 kg. Up with therapy D/C IV fluids  DVT Prophylaxis - Aspirin Weight-bearing as tolerated  Plan for discharge to home today after one session of therapy. Scheduled for outpatient physical therapy at [facility]. Follow-up in the office in 2 weeks with Dr. Wynelle Link.  Hailey Duty, PA-C Orthopedic Surgery 04/12/2018, 8:04 AM

## 2018-04-12 NOTE — Progress Notes (Signed)
Physical Therapy Treatment Patient Details Name: Hailey [name] MRN: [id number] DOB: 1941/02/23 Today's Date: 04/12/2018    History of Present Illness 77 YO female s/p R TKR on 9/16. PMH includes parkinson's disease, BPPV, shoulder replacement 2015, OA, LE edema, HTN, OP.     PT Comments    Patient progressing with ambulation distance and knee flexion.  Will still need lifting help for sit to stand at times due to Parkinson's and stiffness with L knee crepitus.  Continue to feel more appropriate for HHPT, but family reports set up for outpatient PT.  Will follow if not d/c, but stable for d/c from mobility standpoint and all education is completed.  Follow Up Recommendations  Follow surgeon's recommendation for DC plan and follow-up therapies;Supervision for mobility/OOB     Equipment Recommendations  None recommended by PT    Recommendations for Other Services       Precautions / Restrictions Precautions Precautions: Knee;Fall Restrictions Weight Bearing Restrictions: No Other Position/Activity Restrictions: WBAT     Mobility  Bed Mobility Overal bed mobility: Needs Assistance Bed Mobility: Supine to Sit     Supine to sit: Min guard     General bed mobility comments: increased time, cues for scooting hips prior to sitting up  Transfers Overall transfer level: Needs assistance Equipment used: Rolling walker (2 wheeled) Transfers: Sit to/from Stand Sit to Stand: Mod assist         General transfer comment: lifting help from EOB increased time for anterior weight shift  Ambulation/Gait Ambulation/Gait assistance: Supervision;Min guard Gait Distance (Feet): 130 Feet Assistive device: Rolling walker (2 wheeled) Gait Pattern/deviations: Step-to pattern;Step-through pattern;Decreased stride length     General Gait Details: cues for step length   Stairs Stairs: Yes Stairs assistance: Min assist Stair Management: No rails;Backwards Number of Stairs: 1 General  stair comments: increased time and mod cues for safety backing up, performed 2x due to daughter assisting second time   Wheelchair Mobility    Modified Rankin (Stroke Patients Only)       Balance Overall balance assessment: Needs assistance Sitting-balance support: Feet supported Sitting balance-Leahy Scale: Good     Standing balance support: Bilateral upper extremity supported Standing balance-Leahy Scale: Poor                              Cognition Arousal/Alertness: Awake/alert Behavior During Therapy: WFL for tasks assessed/performed Overall Cognitive Status: Within Functional Limits for tasks assessed                                        Exercises Total Joint Exercises Ankle Circles/Pumps: AROM;Both;10 reps;Supine Quad Sets: AROM;10 reps;Right;Supine Heel Slides: AROM;AAROM;10 reps;Right;Supine Hip ABduction/ADduction: AROM;10 reps;Right;Supine Straight Leg Raises: AROM;10 reps;Right;Supine Goniometric ROM: approx 10-65 AAROM    General Comments General comments (skin integrity, edema, etc.): Educated on frequency of exercise and pain meds prior to ice after.  Also educated/simulated a demo for car tranfer and discussed ways to make it easier.  Toileted with min A for balance whild don/doff underwear and with posterior bias.      Pertinent Vitals/Pain Pain Score: 5  Pain Location: R knee with movement Pain Descriptors / Indicators: Sore;Grimacing;Operative site guarding Pain Intervention(s): Monitored during session;Repositioned;Ice applied;RN gave pain meds during session    Home Living  Prior Function            PT Goals (current goals can now be found in the care plan section) Progress towards PT goals: Progressing toward goals    Frequency    7X/week      PT Plan Current plan remains appropriate    Co-evaluation              AM-PAC PT "6 Clicks" Daily Activity  Outcome  Measure  Difficulty turning over in bed (including adjusting bedclothes, sheets and blankets)?: Unable Difficulty moving from lying on back to sitting on the side of the bed? : Unable Difficulty sitting down on and standing up from a chair with arms (e.g., wheelchair, bedside commode, etc,.)?: Unable Help needed moving to and from a bed to chair (including a wheelchair)?: A Little Help needed walking in hospital room?: A Little Help needed climbing 3-5 steps with a railing? : A Little 6 Click Score: 12    End of Session Equipment Utilized During Treatment: Gait belt Activity Tolerance: Patient tolerated treatment well Patient left: in chair;with family/visitor present   PT Visit Diagnosis: Other abnormalities of gait and mobility (R26.89);Difficulty in walking, not elsewhere classified (R26.2)     Time: 4650-3546 PT Time Calculation (min) (ACUTE ONLY): 46 min  Charges:  $Gait Training: 8-22 mins $Therapeutic Exercise: 8-22 mins $Self Care/Home Management: [place], [employer] [phone] 04/12/2018    Reginia Naas 04/12/2018, 11:41 AM

## 2018-04-16 NOTE — Discharge Summary (Signed)
Physician Discharge Summary   Patient ID: Hailey [name] MRN: [id number] DOB/AGE: 28-Jul-1940 77 y.o.  Admit date: 04/09/2018 Discharge date: 04/12/2018  Primary Diagnosis: Osteoarthritis, right knee   Admission Diagnoses:  Past Medical History:  Diagnosis Date  . 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 Bilateral leg edema    takes lasix as needed for dependent edema   . H/O exercise stress test    4-5 yrs. ago, told that it was wnl , done at [place] [facility]   . Hypertension   . Osteoporosis   . Parkinson disease [facility])    Discharge Diagnoses:   Principal Problem:   OA (osteoarthritis) of knee  Estimated body mass index is 25.46 kg/m as calculated from the following:   Height as of this encounter: 5' 5"  (1.651 m).   Weight as of this encounter: 69.4 kg.  Procedure:  Procedure(s) (LRB): RIGHT TOTAL KNEE ARTHROPLASTY (Right)   Consults: None  HPI: Hailey [name] is a 77 y.o. year old female with end stage OA of her right knee with progressively worsening pain and dysfunction. She has constant pain, with activity and at rest and significant functional deficits with difficulties even with ADLs. She has had extensive non-op management including analgesics, injections of cortisone and viscosupplements, and home exercise program, but remains in significant pain with significant dysfunction.Radiographs show bone on bone arthritis medial and patellofemoral. She presents now for right Total Knee Arthroplasty.  Laboratory Data: Admission on 04/09/2018, Discharged on 04/12/2018  Component Date Value Ref Range Status  . WBC 04/10/2018 13.1* 4.0 - 10.5 K/uL Final  . RBC 04/10/2018 3.75* 3.87 - 5.11 MIL/uL Final  . Hemoglobin 04/10/2018 11.5* 12.0 - 15.0 g/dL Final  . HCT 04/10/2018 35.5* 36.0 - 46.0 % Final  . MCV 04/10/2018 94.7  78.0 - 100.0 fL Final  . MCH 04/10/2018 30.7  26.0 - 34.0 pg Final  . MCHC 04/10/2018 32.4  30.0 - 36.0 g/dL Final  . RDW 04/10/2018 13.8  11.5 - 15.5 %  Final  . Platelets 04/10/2018 294  150 - 400 K/uL Final   Performed at [facility], [place] [address], [place], [place] [address]  . Sodium 04/10/2018 142  135 - 145 mmol/L Final  . Potassium 04/10/2018 4.7  3.5 - 5.1 mmol/L Final  . Chloride 04/10/2018 106  98 - 111 mmol/L Final  . CO2 04/10/2018 25  22 - 32 mmol/L Final  . Glucose, Bld 04/10/2018 140* 70 - 99 mg/dL Final  . BUN 04/10/2018 20  8 - 23 mg/dL Final  . Creatinine, Ser 04/10/2018 0.90  0.44 - 1.00 mg/dL Final  . Calcium 04/10/2018 9.2  8.9 - 10.3 mg/dL Final  . GFR calc non Af Amer 04/10/2018 >60  >60 mL/min Final  . GFR calc Af Amer 04/10/2018 >60  >60 mL/min Final   Comment: (NOTE) The eGFR has been calculated using the CKD EPI equation. This calculation has not been validated in all clinical situations. eGFR's persistently <60 mL/min signify possible Chronic Kidney Disease.   Georgiann Hahn gap 04/10/2018 11  5 - 15 Final   Performed at [facility], [place] [address], [place], [place] [address]  . WBC 04/11/2018 12.8* 4.0 - 10.5 K/uL Final  . RBC 04/11/2018 3.54* 3.87 - 5.11 MIL/uL Final  . Hemoglobin 04/11/2018 10.8* 12.0 - 15.0 g/dL Final  . HCT 04/11/2018 33.4* 36.0 - 46.0 % Final  . MCV 04/11/2018 94.4  78.0 - 100.0 fL Final  .  MCH 04/11/2018 30.5  26.0 - 34.0 pg Final  . MCHC 04/11/2018 32.3  30.0 - 36.0 g/dL Final  . RDW 04/11/2018 13.9  11.5 - 15.5 % Final  . Platelets 04/11/2018 281  150 - 400 K/uL Final   Performed at [facility], [place] [address] [place], [place] [address]  . Sodium 04/11/2018 138  135 - 145 mmol/L Final  . Potassium 04/11/2018 4.2  3.5 - 5.1 mmol/L Final  . Chloride 04/11/2018 104  98 - 111 mmol/L Final  . CO2 04/11/2018 25  22 - 32 mmol/L Final  . Glucose, Bld 04/11/2018 110* 70 - 99 mg/dL Final  . BUN 04/11/2018 26* 8 - 23 mg/dL Final  . Creatinine, Ser 04/11/2018 0.88  0.44 - 1.00 mg/dL Final  . Calcium 04/11/2018 9.0  8.9 - 10.3 mg/dL  Final  . GFR calc non Af Amer 04/11/2018 >60  >60 mL/min Final  . GFR calc Af Amer 04/11/2018 >60  >60 mL/min Final   Comment: (NOTE) The eGFR has been calculated using the CKD EPI equation. This calculation has not been validated in all clinical situations. eGFR's persistently <60 mL/min signify possible Chronic Kidney Disease.   Georgiann Hahn gap 04/11/2018 9  5 - 15 Final   Performed at [facility], [place] [address], [place], [place] [address]  . WBC 04/12/2018 8.9  4.0 - 10.5 K/uL Final  . RBC 04/12/2018 3.30* 3.87 - 5.11 MIL/uL Final  . Hemoglobin 04/12/2018 10.2* 12.0 - 15.0 g/dL Final  . HCT 04/12/2018 31.1* 36.0 - 46.0 % Final  . MCV 04/12/2018 94.2  78.0 - 100.0 fL Final  . MCH 04/12/2018 30.9  26.0 - 34.0 pg Final  . MCHC 04/12/2018 32.8  30.0 - 36.0 g/dL Final  . RDW 04/12/2018 14.1  11.5 - 15.5 % Final  . Platelets 04/12/2018 252  150 - 400 K/uL Final   Performed at [facility], [place] [address] [place], [place] [address]  Hospital Outpatient Visit on 04/03/2018  Component Date Value Ref Range Status  . aPTT 04/03/2018 33  24 - 36 seconds Final   Performed at [facility], [place] [address] [place], [place] [address]  . WBC 04/03/2018 6.0  4.0 - 10.5 K/uL Final  . RBC 04/03/2018 4.05  3.87 - 5.11 MIL/uL Final  . Hemoglobin 04/03/2018 12.3  12.0 - 15.0 g/dL Final  . HCT 04/03/2018 38.7  36.0 - 46.0 % Final  . MCV 04/03/2018 95.6  78.0 - 100.0 fL Final  . MCH 04/03/2018 30.4  26.0 - 34.0 pg Final  . MCHC 04/03/2018 31.8  30.0 - 36.0 g/dL Final  . RDW 04/03/2018 13.9  11.5 - 15.5 % Final  . Platelets 04/03/2018 308  150 - 400 K/uL Final   Performed at [facility], [place] [address] [place], [place] [address]  . Sodium 04/03/2018 145  135 - 145 mmol/L Final  . Potassium 04/03/2018 4.3  3.5 - 5.1 mmol/L Final  . Chloride 04/03/2018 110  98 - 111 mmol/L Final  . CO2 04/03/2018 27  22 - 32 mmol/L Final  .  Glucose, Bld 04/03/2018 93  70 - 99 mg/dL Final  . BUN 04/03/2018 21  8 - 23 mg/dL Final  . Creatinine, Ser 04/03/2018 0.95  0.44 - 1.00 mg/dL Final  . Calcium 04/03/2018 9.4  8.9 - 10.3 mg/dL Final  . Total Protein 04/03/2018 7.0  6.5 - 8.1 g/dL Final  .  Albumin 04/03/2018 4.0  3.5 - 5.0 g/dL Final  . AST 04/03/2018 17  15 - 41 U/L Final  . ALT 04/03/2018 <5  0 - 44 U/L Final  . Alkaline Phosphatase 04/03/2018 62  38 - 126 U/L Final  . Total Bilirubin 04/03/2018 1.1  0.3 - 1.2 mg/dL Final  . GFR calc non Af Amer 04/03/2018 57* >60 mL/min Final  . GFR calc Af Amer 04/03/2018 >60  >60 mL/min Final   Comment: (NOTE) The eGFR has been calculated using the CKD EPI equation. This calculation has not been validated in all clinical situations. eGFR's persistently <60 mL/min signify possible Chronic Kidney Disease.   Georgiann Hahn gap 04/03/2018 8  5 - 15 Final   Performed at [facility], [place] [address], [place], [place] [address]  . Prothrombin Time 04/03/2018 13.4  11.4 - 15.2 seconds Final  . INR 04/03/2018 1.03   Final   Performed at [facility], [place] [address] [place], [place] [address]  . ABO/RH(D) 04/03/2018 A POS   Final  . Antibody Screen 04/03/2018 NEG   Final  . Sample Expiration 04/03/2018 04/12/2018   Final  . Extend sample reason 04/03/2018    Final                   Value:NO TRANSFUSIONS OR PREGNANCY IN THE PAST 3 MONTHS Performed at [facility], [place] [address], [place], [place] [address]   . MRSA, PCR 04/03/2018 NEGATIVE  NEGATIVE Final  . Staphylococcus aureus 04/03/2018 NEGATIVE  NEGATIVE Final   Comment: (NOTE) The Xpert SA Assay (FDA approved for NASAL specimens in patients 45 years of age and older), is one component of a comprehensive surveillance program. It is not intended to diagnose infection nor to guide or monitor treatment. Performed at [facility], [place] [address] [place],  [place] [address]   . ABO/RH(D) 04/03/2018    Final                   Value:A POS Performed at [facility], [place] [address], [place], [place] [address]      X-Rays:No results found.  EKG: Orders placed or performed during the hospital encounter of 07/10/17  . ED EKG within 10 minutes  . ED EKG within 10 minutes  . EKG     Hospital Course: Kimani Bedoya is a 76 y.o. who was admitted to [facility]. They were brought to the operating room on 04/09/2018 and underwent Procedure(s): RIGHT TOTAL KNEE ARTHROPLASTY.  Patient tolerated the procedure well and was later transferred to the recovery room and then to the orthopaedic floor for postoperative care. They were given PO and IV analgesics for pain control following their surgery. They were given 24 hours of postoperative antibiotics of  Anti-infectives (From admission, onward)   Start     Dose/Rate Route Frequency Ordered Stop   04/09/18 1830  ceFAZolin (ANCEF) IVPB 2g/100 mL premix     2 g 200 mL/hr over 30 Minutes Intravenous Every 6 hours 04/09/18 1504 04/10/18 0003   04/09/18 1015  ceFAZolin (ANCEF) IVPB 2g/100 mL premix     2 g 200 mL/hr over 30 Minutes Intravenous On call to O.R. 04/09/18 1013 04/09/18 1251     and started on DVT prophylaxis in the form of Aspirin.   PT and OT were ordered for total joint protocol. Discharge planning consulted to help with postop disposition and equipment needs. Patient  had a good night on the evening of surgery. They started to get up OOB with therapy on POD #0. Hemovac drain was pulled without difficulty on day one. Continued to work with therapy into POD #2. Pt was seen on day two and was having issues with confusion overnight per family member. This was believed to be related to pain medication, and narcotics were discontinued at that point. Dressing was changed and the incision was clean, dry,and intact with no drainage. She continued to work with therapy on day two and was doing  very well. Mental status improved with no further confusion after discontinuation of the oxycodone, she used only tramadol for pain management. Pt was seen during rounds on POD #3 and was ready to go home. Incision was healing well. She worked with therapy for one additional session and was meeting her goals. She was discharged to home later that day in stable condition.   Diet: Regular diet Activity: WBAT Follow-up: in 2 weeks with Dr. Wynelle Link Disposition: Home with outpatient PT at [facility] Discharged Condition: stable   Discharge Instructions    Call MD / Call 911   Complete by:  As directed    If you experience chest pain or shortness of breath, CALL 911 and be transported to the hospital emergency room.  If you develope a fever above 101 F, pus (white drainage) or increased drainage or redness at the wound, or calf pain, call your surgeon's office.   Change dressing   Complete by:  As directed    Change the dressing daily with sterile 4 x 4 inch gauze dressing and apply TED hose.   Constipation Prevention   Complete by:  As directed    Drink plenty of fluids.  Prune juice may be helpful.  You may use a stool softener, such as Colace (over the counter) 100 mg twice a day.  Use MiraLax (over the counter) for constipation as needed.   Diet - low sodium heart healthy   Complete by:  As directed    Discharge instructions   Complete by:  As directed    Dr. Gaynelle Arabian Total Joint Specialist [facility] [address], [place], [place] [address] [phone]  TOTAL KNEE REPLACEMENT POSTOPERATIVE DIRECTIONS  Knee Rehabilitation, Guidelines Following Surgery  Results after knee surgery are often greatly improved when you follow the exercise, range of motion and muscle strengthening exercises prescribed by your doctor. Safety measures are also important to protect the knee from further injury. Any time any of these exercises cause you to have increased pain or swelling in  your knee joint, decrease the amount until you are comfortable again and slowly increase them. If you have problems or questions, call your caregiver or physical therapist for advice.   HOME CARE INSTRUCTIONS  Remove items at home which could result in a fall. This includes throw rugs or furniture in walking pathways.  ICE to the affected knee every three hours for 30 minutes at a time and then as needed for pain and swelling.  Continue to use ice on the knee for pain and swelling from surgery. You may notice swelling that will progress down to the foot and ankle.  This is normal after surgery.  Elevate the leg when you are not up walking on it.   Continue to use the breathing machine which will help keep your temperature down.  It is common for your temperature to cycle up and down following surgery, especially at night when  you are not up moving around and exerting yourself.  The breathing machine keeps your lungs expanded and your temperature down. Do not place pillow under knee, focus on keeping the knee straight while resting   DIET You may resume your previous home diet once your are discharged from the hospital.  DRESSING / WOUND CARE / SHOWERING You may shower 3 days after surgery, but keep the wounds dry during showering.  You may use an occlusive plastic wrap (Press'n Seal for example), NO SOAKING/SUBMERGING IN THE BATHTUB.  If the bandage gets wet, change with a clean dry gauze.  If the incision gets wet, pat the wound dry with a clean towel. You may start showering once you are discharged home but do not submerge the incision under water. Just pat the incision dry and apply a dry gauze dressing on daily. Change the surgical dressing daily and reapply a dry dressing each time.  ACTIVITY Walk with your walker as instructed. Use walker as long as suggested by your caregivers. Avoid periods of inactivity such as sitting longer than an hour when not asleep. This helps prevent blood clots.    You may resume a sexual relationship in one month or when given the OK by your doctor.  You may return to work once you are cleared by your doctor.  Do not drive a car for 6 weeks or until released by you surgeon.  Do not drive while taking narcotics.  WEIGHT BEARING Weight bearing as tolerated with assist device (walker, cane, etc) as directed, use it as long as suggested by your surgeon or therapist, typically at least 4-6 weeks.  POSTOPERATIVE CONSTIPATION PROTOCOL Constipation - defined medically as fewer than three stools per week and severe constipation as less than one stool per week.  One of the most common issues patients have following surgery is constipation.  Even if you have a regular bowel pattern at home, your normal regimen is likely to be disrupted due to multiple reasons following surgery.  Combination of anesthesia, postoperative narcotics, change in appetite and fluid intake all can affect your bowels.  In order to avoid complications following surgery, here are some recommendations in order to help you during your recovery period.  Colace (docusate) - Pick up an over-the-counter form of Colace or another stool softener and take twice a day as long as you are requiring postoperative pain medications.  Take with a full glass of water daily.  If you experience loose stools or diarrhea, hold the colace until you stool forms back up.  If your symptoms do not get better within 1 week or if they get worse, check with your doctor.  Dulcolax (bisacodyl) - Pick up over-the-counter and take as directed by the product packaging as needed to assist with the movement of your bowels.  Take with a full glass of water.  Use this product as needed if not relieved by Colace only.   MiraLax (polyethylene glycol) - Pick up over-the-counter to have on hand.  MiraLax is a solution that will increase the amount of water in your bowels to assist with bowel movements.  Take as directed and can mix with  a glass of water, juice, soda, coffee, or tea.  Take if you go more than two days without a movement. Do not use MiraLax more than once per day. Call your doctor if you are still constipated or irregular after using this medication for 7 days in a row.  If you continue to have problems  with postoperative constipation, please contact the office for further assistance and recommendations.  If you experience "the worst abdominal pain ever" or develop nausea or vomiting, please contact the office immediatly for further recommendations for treatment.  ITCHING  If you experience itching with your medications, try taking only a single pain pill, or even half a pain pill at a time.  You can also use Benadryl over the counter for itching or also to help with sleep.   TED HOSE STOCKINGS Wear the elastic stockings on both legs for three weeks following surgery during the day but you may remove then at night for sleeping.  MEDICATIONS See your medication summary on the "After Visit Summary" that the nursing staff will review with you prior to discharge.  You may have some home medications which will be placed on hold until you complete the course of blood thinner medication.  It is important for you to complete the blood thinner medication as prescribed by your surgeon.  Continue your approved medications as instructed at time of discharge.  PRECAUTIONS If you experience chest pain or shortness of breath - call 911 immediately for transfer to the hospital emergency department.  If you develop a fever greater that 101 F, purulent drainage from wound, increased redness or drainage from wound, foul odor from the wound/dressing, or calf pain - CONTACT YOUR SURGEON.                                                   FOLLOW-UP APPOINTMENTS Make sure you keep all of your appointments after your operation with your surgeon and caregivers. You should call the office at the above phone number and make an appointment for  approximately two weeks after the date of your surgery or on the date instructed by your surgeon outlined in the "After Visit Summary".   RANGE OF MOTION AND STRENGTHENING EXERCISES  Rehabilitation of the knee is important following a knee injury or an operation. After just a few days of immobilization, the muscles of the thigh which control the knee become weakened and shrink (atrophy). Knee exercises are designed to build up the tone and strength of the thigh muscles and to improve knee motion. Often times heat used for twenty to thirty minutes before working out will loosen up your tissues and help with improving the range of motion but do not use heat for the first two weeks following surgery. These exercises can be done on a training (exercise) mat, on the floor, on a table or on a bed. Use what ever works the best and is most comfortable for you Knee exercises include:  Leg Lifts - While your knee is still immobilized in a splint or cast, you can do straight leg raises. Lift the leg to 60 degrees, hold for 3 sec, and slowly lower the leg. Repeat 10-20 times 2-3 times daily. Perform this exercise against resistance later as your knee gets better.  Quad and Hamstring Sets - Tighten up the muscle on the front of the thigh (Quad) and hold for 5-10 sec. Repeat this 10-20 times hourly. Hamstring sets are done by pushing the foot backward against an object and holding for 5-10 sec. Repeat as with quad sets.  Leg Slides: Lying on your back, slowly slide your foot toward your buttocks, bending your knee up off the floor (only  go as far as is comfortable). Then slowly slide your foot back down until your leg is flat on the floor again. Angel Wings: Lying on your back spread your legs to the side as far apart as you can without causing discomfort.  A rehabilitation program following serious knee injuries can speed recovery and prevent re-injury in the future due to weakened muscles. Contact your doctor or a  physical therapist for more information on knee rehabilitation.   IF YOU ARE TRANSFERRED TO A SKILLED REHAB FACILITY If the patient is transferred to a skilled rehab facility following release from the hospital, a list of the current medications will be sent to the facility for the patient to continue.  When discharged from the skilled rehab facility, please have the facility set up the patient's [place] prior to being released. Also, the skilled facility will be responsible for providing the patient with their medications at time of release from the facility to include their pain medication, the muscle relaxants, and their blood thinner medication. If the patient is still at the rehab facility at time of the two week follow up appointment, the skilled rehab facility will also need to assist the patient in arranging follow up appointment in our office and any transportation needs.  MAKE SURE YOU:  Understand these instructions.  Get help right away if you are not doing well or get worse.    Pick up stool softner and laxative for home use following surgery while on pain medications. Do not submerge incision under water. Please use good hand washing techniques while changing dressing each day. May shower starting three days after surgery. Please use a clean towel to pat the incision dry following showers. Continue to use ice for pain and swelling after surgery. Do not use any lotions or creams on the incision until instructed by your surgeon.   Do not put a pillow under the knee. Place it under the heel.   Complete by:  As directed    Driving restrictions   Complete by:  As directed    No driving for two weeks   TED hose   Complete by:  As directed    Use stockings (TED hose) for three weeks on both leg(s).  You may remove them at night for sleeping.   Weight bearing as tolerated   Complete by:  As directed      Allergies as of 04/12/2018      Reactions   Forteo  [teriparatide (recombinant)] Other (See Comments)   " strange feeling and I cannot describe how I felt but the Dr took me off of it"   Merthiolate [thimerosal] Other (See Comments)   Tincture Merthiolate-skin blistering      Medication List    TAKE these medications   acetaminophen 650 MG CR tablet Commonly known as:  TYLENOL Take 1,300 mg by mouth every 8 (eight) hours as needed for pain.   amoxicillin 500 MG capsule Commonly known as:  AMOXIL Take 2,000 mg by mouth See admin instructions. Take 4 capsules (2000 mg) by mouth one hour prior to dental appointment   aspirin 325 MG EC tablet Take 1 tablet (325 mg total) by mouth 2 (two) times daily for 18 days. Then resume one 81 mg aspirin once a day. What changed:    medication strength  how much to take  when to take this  additional instructions   CALTRATE 600+D 600-400 MG-UNIT tablet Generic drug:  Calcium Carbonate-Vitamin D Take  1 tablet by mouth daily.   carbidopa-levodopa 25-100 MG tablet Commonly known as:  SINEMET IR TAKE 1 TABLET BY MOUTH 3 TIMES DAILY, TAKE 4 HOURS APART, DO NOT EAT WITH PROTEIN-RICH FOODS What changed:  See the new instructions.   furosemide 20 MG tablet Commonly known as:  LASIX Take 20 mg by mouth daily as needed for fluid or edema.   methocarbamol 500 MG tablet Commonly known as:  ROBAXIN Take 1 tablet (500 mg total) by mouth every 6 (six) hours as needed for muscle spasms.   metoprolol succinate 100 MG 24 hr tablet Commonly known as:  TOPROL-XL Take 100 mg by mouth daily. Take with or immediately following a meal.   traMADol 50 MG tablet Commonly known as:  ULTRAM Take 1-2 tablets (50-100 mg total) by mouth every 6 (six) hours as needed for moderate pain (use second).   Vitamin D (Ergocalciferol) 50000 units Caps capsule Commonly known as:  DRISDOL Take 50,000 Units by mouth 2 (two) times a week. Tuesdays and Fridays            Discharge Care Instructions  (From admission,  onward)         Start     Ordered   04/12/18 0000  Weight bearing as tolerated     04/12/18 0810   04/12/18 0000  Change dressing    Comments:  Change the dressing daily with sterile 4 x 4 inch gauze dressing and apply TED hose.   04/12/18 0810         Follow-up Information    Gaynelle Arabian, MD. Schedule an appointment as soon as possible for a visit on 04/24/2018.   Specialty:  Orthopedic Surgery Contact information: [address] [place] [address] [phone]           Signed: [name], PA-C Orthopedic Surgery 04/16/2018, 10:05 AM

## 2018-07-06 ENCOUNTER — Other Ambulatory Visit: Payer: Self-pay | Admitting: Neurology

## 2018-07-24 ENCOUNTER — Other Ambulatory Visit: Payer: Self-pay | Admitting: Internal Medicine

## 2018-07-24 DIAGNOSIS — Z1231 Encounter for screening mammogram for malignant neoplasm of breast: Secondary | ICD-10-CM

## 2018-07-27 ENCOUNTER — Ambulatory Visit
Admission: RE | Admit: 2018-07-27 | Discharge: 2018-07-27 | Disposition: A | Discharge status: Home / Self Care | Payer: Medicare Other | Source: Ambulatory Visit | Attending: Internal Medicine | Admitting: Internal Medicine

## 2018-07-27 DIAGNOSIS — Z1231 Encounter for screening mammogram for malignant neoplasm of breast: Secondary | ICD-10-CM

## 2018-09-05 ENCOUNTER — Encounter: Payer: Self-pay | Admitting: Neurology

## 2018-09-05 ENCOUNTER — Ambulatory Visit: Payer: Medicare Other | Admitting: Neurology

## 2018-09-05 VITALS — BP 140/60 | HR 59 | Ht 65.0 in | Wt 146.0 lb

## 2018-09-05 DIAGNOSIS — R269 Unspecified abnormalities of gait and mobility: Secondary | ICD-10-CM | POA: Diagnosis not present

## 2018-09-05 DIAGNOSIS — G20 Parkinson's disease: Secondary | ICD-10-CM

## 2018-09-05 NOTE — Patient Instructions (Signed)
Hailey [name] will call, Social Worker Continue Sinemet  Parkinson Disease Parkinson disease is a long-term (chronic) condition that gets worse over time (is progressive). Parkinson disease limits your ability to control your movements and move your body normally. This condition is a type of movement disorder. Each person with Parkinson disease is affected differently. The condition can range from mild to severe. Parkinson disease tends to progress slowly over several years. What are the causes? Parkinson disease results from a loss of brain cells (neurons) in a specific part of the brain (substantia nigra). Some of the neurons in the substantia nigra make an important brain chemical (dopamine). Dopamine is needed to control movement. As the condition gets worse, neurons make less dopamine. This makes it hard to move or control your movements. The exact cause of why neurons are lost or produce less dopamine is not known. Genetic and environmental factors may contribute to the cause of Parkinson disease. What increases the risk? This condition is more likely to develop in:  Men.  People who are 78 years of age or older.  People who have a family history of Parkinson disease. What are the signs or symptoms? Symptoms of this condition can vary from person to person. The main (primary) symptoms are related to movement (motor symptoms). These include:  Uncontrolled shaking movements (tremor). Tremors usually start in a hand or foot when you are resting (resting tremor). The tremor may stop when you move around.  Slowing of movement. You may lose facial expression and have trouble making the small movements that are needed to button clothing or brush your teeth. You may walk with short, shuffling steps.  Stiff movement (rigidity). This mostly affects your arms, legs, neck, and upper body. You may walk without swinging your arms. Rigidity can be painful.  Loss of balance and stability when  standing. You may sway, fall backward, and have trouble making turns. Secondary motor symptoms of this condition include:  Shrinking handwriting.  Stooped posture.  Slowed speech.  Trouble swallowing.  Drooling.  Sexual dysfunction.  Muscle cramps.  Loss of smell. Additional symptoms that are not related to movement include:  Constipation.  Mood swings.  Depression or anxiety.  Sleep disturbances.  Confusion.  Loss of mental abilities (dementia).  Low blood pressure.  Trouble concentrating. How is this diagnosed? Parkinson disease can be hard to diagnose in its early stages. A diagnosis may be made based on symptoms, a medical history, and physical exam. During your exam, your health care provider will look for:  Lack of facial expression.  Resting tremor.  Stiffness in your neck, arms, and legs.  Abnormal walk.  Trouble with balance. You may have brain imaging tests done to check for a loss of dopamine-producing areas of the brain. Your healthcare provider may also grade the severity of your condition as mild, moderate, or advanced. Parkinson disease progression is different for everyone. You may not progress to the advanced stage. Mild Parkinson disease involves:  Movement problems that do not affect daily activities.  Movement problems on one side of the body.  Movement problems that are controlled with medicines.  Good response to exercise. Moderate Parkinson disease involves:  Movement problems on both sides of the body.  Slowing of movement.  Coordination and balance problems.  Less of a response to medicine.  More side effects from medicines. Advanced Parkinson disease involves:  Extreme difficulty walking.  Inability to live alone safely.  Signs of dementia.  Difficulty controlling symptoms with medicine. How  is this treated? There is no cure for Parkinson disease. Treatment focuses on relieving your symptoms. Treatment may  include:  Medicines.  Speech, occupational, and physical therapy.  Surgery. Everyone responds to medicines differently. Your response may change over time. Work with your health care provider to find the best medicines for you. These may include:  Dopamine replacement drugs. These are the most effective medicines. A long-term side effect of these medicines is uncontrolled movements (dyskinesias).  Dopamine agonists. These drugs act like dopamine to stimulate dopamine receptors in the brain. Side effects include nausea and sleepiness, but they cause less dyskinesia.  Other medicines to reduce tremor, prevent dopamine breakdown, reduce dyskinesia, and reduce dementia that is related to Parkinson disease. Another treatment is deep brain stimulation surgery to reduce tremors and dyskinesia. This procedure involves placing electrodes in the brain. The electrodes are attached to an electric pulse generator that acts like a pacemaker for your brain. This may be an option if you have had the condition for at least four years and are not responding well to medicines. Follow these instructions at home:      Take over-the-counter and prescription medicines only as told by your health care provider.  Install grab bars and railings in your home to prevent falls.  Follow instructions from your health care provider about eating or drinking restrictions.  Return to your normal activities as told by your health care provider. Ask your health care provider what activities are safe for you.  Get regular exercise as told by your health care provider or a physical therapist.  Keep all follow-up visits as told by your health care provider. This is important. These include any visits with a speech therapist or occupational therapist.  Consider joining a support group for people with Parkinson disease. Contact a health care provider if:  Medicines do not help your symptoms.  You are unsteady or have  fallen at home.  You need more support to function well at home.  You have trouble swallowing.  You have severe constipation.  You are struggling with side effects from your medicines.  You see or hear things that are not real (hallucinate).  You feel confused, anxious, or depressed. Get help right away if:  You are injured after a fall.  You cannot swallow without choking.  You have chest pain or trouble breathing.  You do not feel safe at home. This information is not intended to replace advice given to you by your health care provider. Make sure you discuss any questions you have with your health care provider. Document Released: 07/08/2000 Document Revised: 12/14/2015 Document Reviewed: 05/01/2015 Elsevier Interactive Patient Education  2019 [employer].

## 2018-09-05 NOTE — Progress Notes (Signed)
[facility]    Provider:  Dr Jaynee Eagles Referring Provider: Prince Solian, MD Primary Care Physician:  Prince Solian, MD  CC:  Tremors  Interval history 09/05/2018: She feels her tremors are improved at least the internal tremors. She is slowing down. She talks in her sleep but no acting out of dreams. She can try melatonin before bedtime. She is sedentary. Encouraged classes during the day, gave her a list of PD specific classes and will send her to a Education officer, museum at [employer] to meet. Discussed exercise and decreased progression. No falls. No difficulty swallowing. Here with daughter who says she has tried to get patient active and moving and she won;t do it. Had  Discussion, exercise is the best way to slow down progression. Called [name], will try to get her to see this PD specific social worker. Also provided information on PD of the [place], exercise classes for PD and support groups. Discussed assisted living and safety in the home. Also make sure family has POA.    Interval history; DAT scan c/w Parkinsonian disorder. Discussed in detail with patient and husband. Discussed medication management.  Recommended exercise classes. No falls in the last few weeks. Answered all questions. Discussed parkinson's disease, treatment, they had multiple questions about Sinemet. Make sure she gets skin checks to eval for skin malignancies. Discussed association between PD and melanoma.   HPI:  Sheridan Hew is a 78 y.o. female here as a referral from Dr. Dagmar Hait for a new problem Tremors. She was seen in the past in 2016 for dizziness which resolved an MRI of the brain was unremarkable. Here with sister in law. She feels tremulous. Brushing her teeth is difficult because her muscles dont want to work. Sister in law provides much information, everything is "slow motion", eating is slower, movements are slower overall, walking is slower but no falls. She started notciing symptoms  more with BP medications a few years ago, symptoms are stable. She takes very low, speech has decreased in tone. Writing has changed, a little smaller and sloppy. No drooling or wet pillows. She says occasionally she stops when walking and has to get her legs going again and then her legs "unlock". No falls. No dizziness. No significant memory changes, age appropriate things per patient and sister-in-law. No Hallucinations or delusions. She has a resting tremor and action as well. No bvid dreams, but more trouble initiating sleep. She has toruble getting out of chairs but also has knee pain. Smell has worsened, decreased. She denies a resting tremor, her sister in law notices a resting tremor and action tremor. No difficulty with swallowing or choking on food.    Reviewed notes, labs and imaging from outside physicians, which showed:  Review primary care notes, patient has a left hand tremor but somewhat episodic sometimes at rest with inattention lasting 5-10 minutes. No falls. No swallowing difficulty some excess watering of the eyes. This is new, both at rest and with intention currently not symptomatic, also noted flat facies , slowed gait which may be attributable to arthritis and significant knee pain but no shuffling, no cogwheeling, evaluation for atypical Parkinson's.   Labs include see him he which showed BUN 21, creatinine 0.9 otherwise largely unremarkable, CBC unremarkable labs were drawn 12/26/2016.  Review of Systems: Patient complains of symptoms per HPI as well as the following symptoms: no CP, no SOB. Pertinent negatives and positives per HPI. All others negative.   Social History   Socioeconomic History  .  Marital status: Single    Spouse name: Not on file  . Number of children: 0  . Years of education: 86  . Highest education level: Not on file  Occupational History  . Not on file  Social Needs  . Financial resource strain: Not on file  . Food insecurity:    Worry:  Not on file    Inability: Not on file  . Transportation needs:    Medical: Not on file    Non-medical: Not on file  Tobacco Use  . Smoking status: Former Smoker    Years: 10.00    Types: Cigarettes    Last attempt to quit: 12/06/1968    Years since quitting: 49.7  . Smokeless tobacco: Never Used  . Tobacco comment: tried snuff once but "never could get it wet"  Substance and Sexual Activity  . Alcohol use: No    Alcohol/week: 0.0 standard drinks  . Drug use: No  . Sexual activity: Not on file  Lifestyle  . Physical activity:    Days per week: Not on file    Minutes per session: Not on file  . Stress: Not on file  Relationships  . Social connections:    Talks on phone: Not on file    Gets together: Not on file    Attends religious service: Not on file    Active member of club or organization: Not on file    Attends meetings of clubs or organizations: Not on file    Relationship status: Not on file  . Intimate partner violence:    Fear of current or ex partner: Not on file    Emotionally abused: Not on file    Physically abused: Not on file    Forced sexual activity: Not on file  Other Topics Concern  . Not on file  Social History Narrative   Lives at home by herself.   Right handed.   Caffeine use: 2 glasses tea/day     Family History  Problem Relation Age of Onset  . Colon cancer Mother   . Heart Problems Mother   . Heart Problems Father   . Heart Problems Brother   . Breast cancer Cousin   . Breast cancer Cousin   . Breast cancer Cousin   . Breast cancer Cousin   . Breast cancer Cousin     Past Medical History:  Diagnosis Date  . 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 Bilateral leg edema    takes lasix as needed for dependent edema   . H/O exercise stress test    4-5 yrs. ago, told that it was wnl , done at [place] office   . Hypertension   . Osteoporosis   . Parkinson disease [facility])     Past Surgical History:  Procedure Laterality Date  .  BREAST CYST EXCISION Left 1994   benign  . cataract surgery Bilateral 2007  . EYE SURGERY     /w IOL  . FOOT TUMOR EXCISION Left 1992   benign  . MOUTH SURGERY    . REVERSE SHOULDER ARTHROPLASTY Right 12/26/2013   Procedure: RIGHT SHOULDER REVERSE ARTHROPLASTY;  Surgeon: Marin Shutter, MD;  Location: [place];  Service: Orthopedics;  Laterality: Right;  . TOTAL KNEE ARTHROPLASTY Right 04/09/2018   Procedure: RIGHT TOTAL KNEE ARTHROPLASTY;  Surgeon: Gaynelle Arabian, MD;  Location: [facility];  Service: Orthopedics;  Laterality: Right;  Adductor Block    Current Outpatient Medications  Medication Sig Dispense  Refill  . acetaminophen (TYLENOL) 650 MG CR tablet Take 1,300 mg by mouth every 8 (eight) hours as needed for pain.    . Calcium Carbonate-Vitamin D (CALTRATE 600+D) 600-400 MG-UNIT per tablet Take 1 tablet by mouth daily.    . carbidopa-levodopa (SINEMET IR) 25-100 MG tablet TAKE 1 TABLET BY MOUTH 3 TIMES DAILY. TAKE 4 HOURS APART.  DO NOT EATWITH PROTEIN-RICH FOODS 90 tablet 3  . furosemide (LASIX) 20 MG tablet Take 20 mg by mouth daily as needed for fluid or edema.     . metoprolol succinate (TOPROL-XL) 100 MG 24 hr tablet Take 100 mg by mouth daily. Take with or immediately following a meal.    . Vitamin D, Ergocalciferol, (DRISDOL) 50000 units CAPS capsule Take 50,000 Units by mouth 2 (two) times a week. Tuesdays and Fridays    . amoxicillin (AMOXIL) 500 MG capsule Take 2,000 mg by mouth See admin instructions. Take 4 capsules (2000 mg) by mouth one hour prior to dental appointment     No current facility-administered medications for this visit.     Allergies as of 09/05/2018 - Review Complete 09/05/2018  Allergen Reaction Noted  . Forteo [teriparatide (recombinant)] Other (See Comments) 10/24/2012  . Merthiolate [thimerosal] Other (See Comments) 03/29/2018    Vitals: BP 140/60 (BP Location: Right Arm, Patient Position: Sitting)   Pulse (!) 59   Ht 5\' 5"  (1.651 m)   Wt 146 lb (66.2  kg)   BMI 24.30 kg/m  Last Weight:  Wt Readings from Last 1 Encounters:  09/05/18 146 lb (66.2 kg)   Last Height:   Ht Readings from Last 1 Encounters:  09/05/18 5\' 5"  (1.651 m)    Speech:    Speech is normal; fluent and spontaneous with normal comprehension.  Cognition:    The patient is oriented to person, place, and time;     recent and remote memory intact;     language fluent;     normal attention, concentration,     fund of knowledge Cranial Nerves: hypomimia    The pupils are equal, round, and reactive to light. Attempted fundoscopic exam could not visualize. Visual fields are full to finger confrontation. Extraocular movements are intact. Trigeminal sensation is intact and the muscles of mastication are normal. The face is symmetric. The palate elevates in the midline. Hearing intact. Voice is normal. Shoulder shrug is normal. The tongue has normal motion without fasciculations.   Coordination:    No dysmetria. decreaed amplitude on finger tapping.  Gait:    Difficulty getting out of chair.  Decreased arm swing. Short narrowed strides but not shuffling  Motor Observation:    Slight Postural tremor. Tone:    Mild cogwheeling right arm enhanced with facilitation  Posture:    Posture is normal. normal erect    Strength:  mild prox weakness otherwise strength is V/V in the upper and lower limbs.      Sensation: intact to LT     Reflex Exam:  DTR's:    Absent AJs   Toes:    The toes are downgoing bilaterally.   Clonus:    Clonus is absent.   Assessment/Plan:  78 year old patient with Parkinson's disease, +DAT scan.    Continue Carbidopa/Levodopa: 1 pill 3x a day. Take medication 4 hours apart (example 8am, noon and 4pm). She feels her internal tremors are improved. No falls.   -Try to separate Sinemet from food (especially protein-rich foods like meat, dairy, eggs) by about 30-60 mins (  take 30 minutes before food or 60 minutes after food) - this will  help the absorption of the medication. If you have some nausea with the medication, you can take it with some light food like crackers or ginger ale  -Encouraged classes during the day, gave her a list of PD specific classes and will send her to a Education officer, museum at [employer] to meet. Discussed exercise with good evidence for decreased progression.   - No falls. No difficulty swallowing.   - Here with daughter who says she has tried to get patient active and moving and she won;t do it. Had  Discussion, exercise is the best way to slow down progression.  -  Called [name], will try to get her to see this PD specific social worker.   - Also provided information on [place] of [place], exercise classes for PD and support groups.   - Discussed assisted living and safety in the home. Also make sure family has POA.   - Physical Therapy next door  Orders Placed This Encounter  Procedures  . Ambulatory referral to Physical Therapy    Sarina Ill, MD  [facility] [address] [place],  [address]  Phone [phone] Fax [phone]  A total of 25 minutes was spent face-to-face with this patient. Over half this time was spent on counseling patient on the parkinson's disease diagnosis and different diagnostic and therapeutic options available.

## 2018-09-26 ENCOUNTER — Other Ambulatory Visit: Payer: Self-pay | Admitting: Gastroenterology

## 2018-09-26 DIAGNOSIS — K862 Cyst of pancreas: Secondary | ICD-10-CM

## 2018-10-02 ENCOUNTER — Ambulatory Visit
Admission: RE | Admit: 2018-10-02 | Discharge: 2018-10-02 | Disposition: A | Discharge status: Home / Self Care | Payer: Medicare Other | Source: Ambulatory Visit | Attending: Gastroenterology | Admitting: Gastroenterology

## 2018-10-02 DIAGNOSIS — K862 Cyst of pancreas: Secondary | ICD-10-CM

## 2018-10-02 MED ORDER — GADOBENATE DIMEGLUMINE 529 MG/ML IV SOLN
13.0000 mL | Freq: Once | INTRAVENOUS | Status: AC | PRN
  Administered 2018-10-02: 13 mL via INTRAVENOUS

## 2018-11-07 ENCOUNTER — Other Ambulatory Visit: Payer: Self-pay | Admitting: Neurology

## 2019-01-21 IMAGING — DX DG CHEST 2V
2 series · 2 of 2 positions shown · non-contrast
Comparison: None in PACs

CLINICAL DATA: Severe midline chest pain began yesterday morning
with no associated symptoms. Remote history of smoking. History of
Parkinson's disease.

EXAM:
CHEST  2 VIEW

[chest lat]
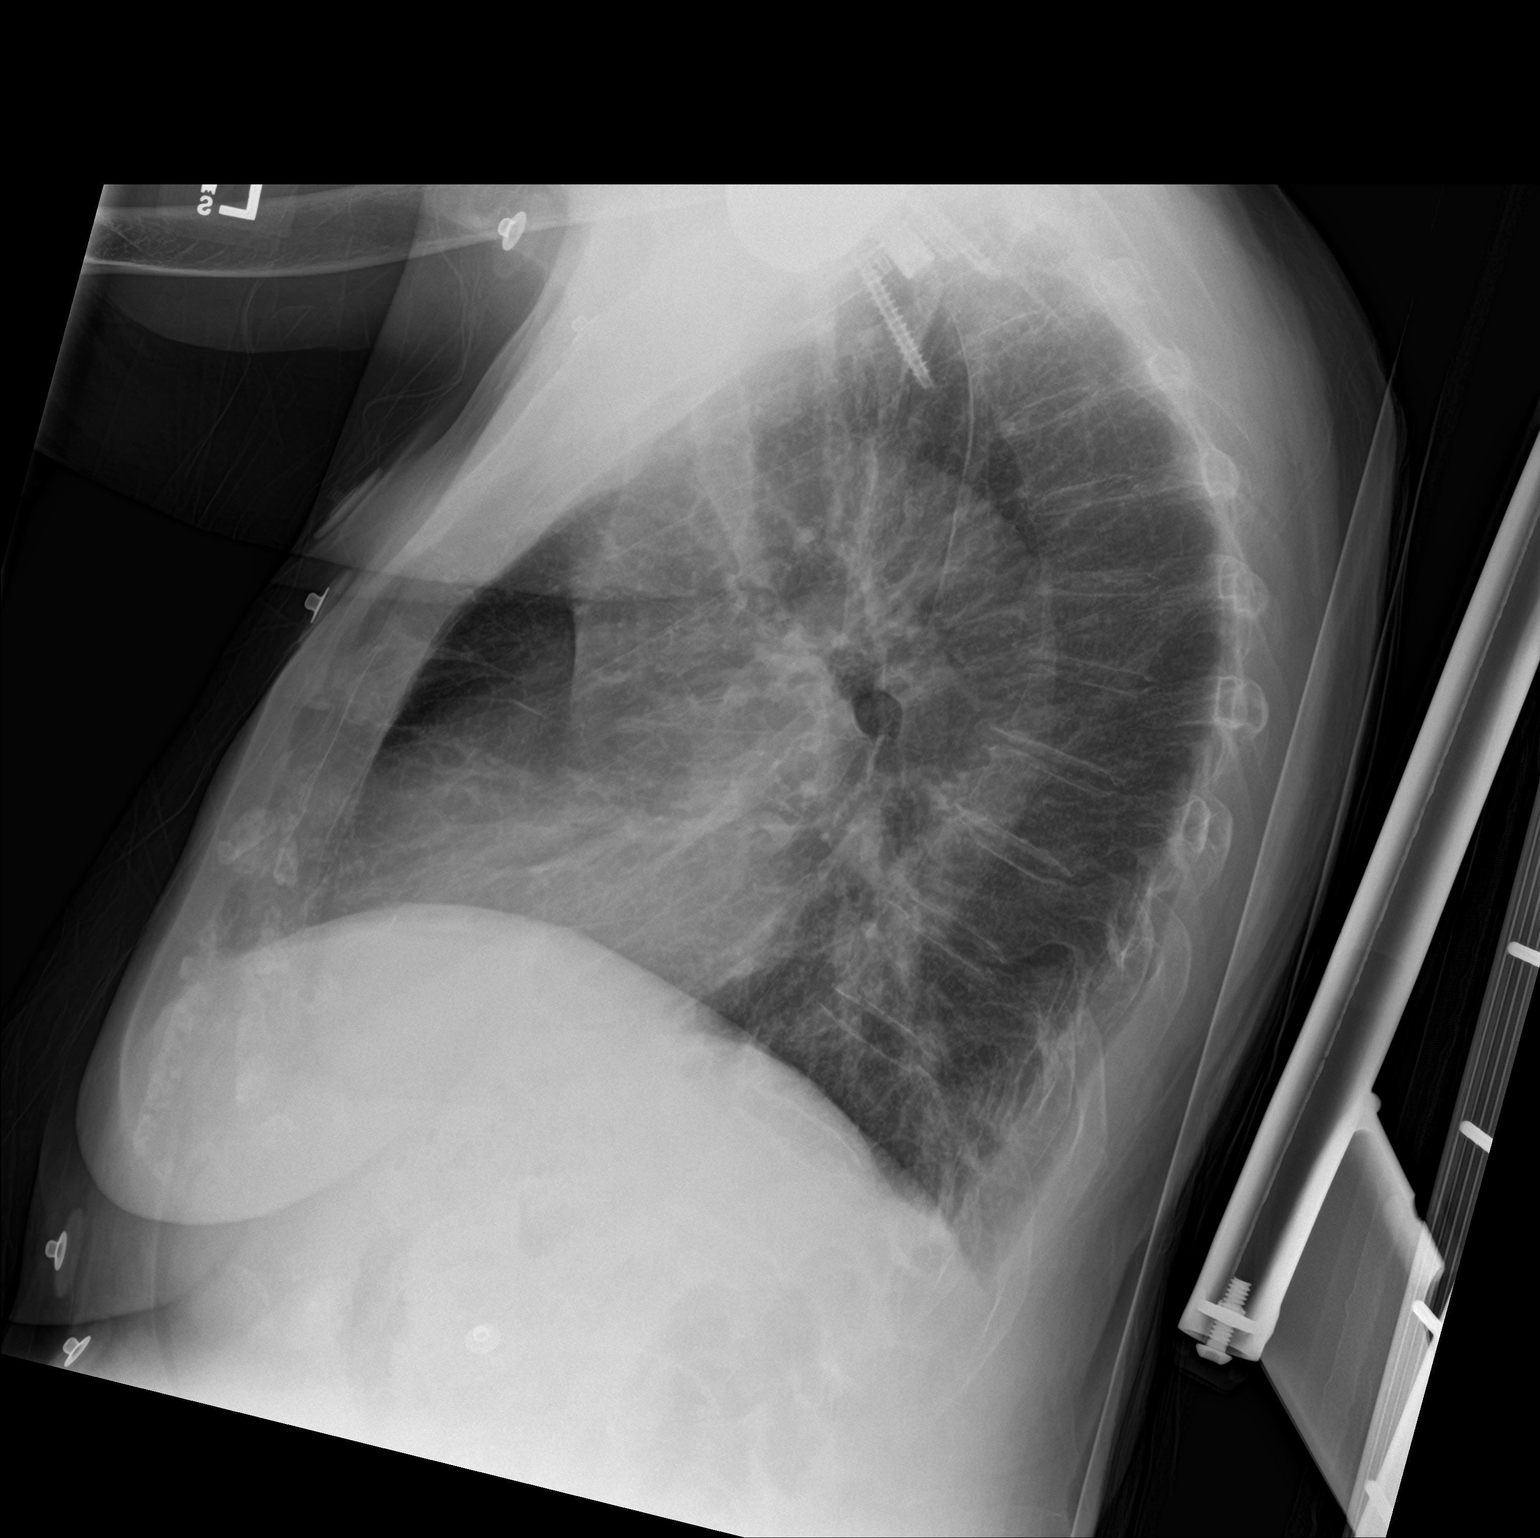

[chest ap]
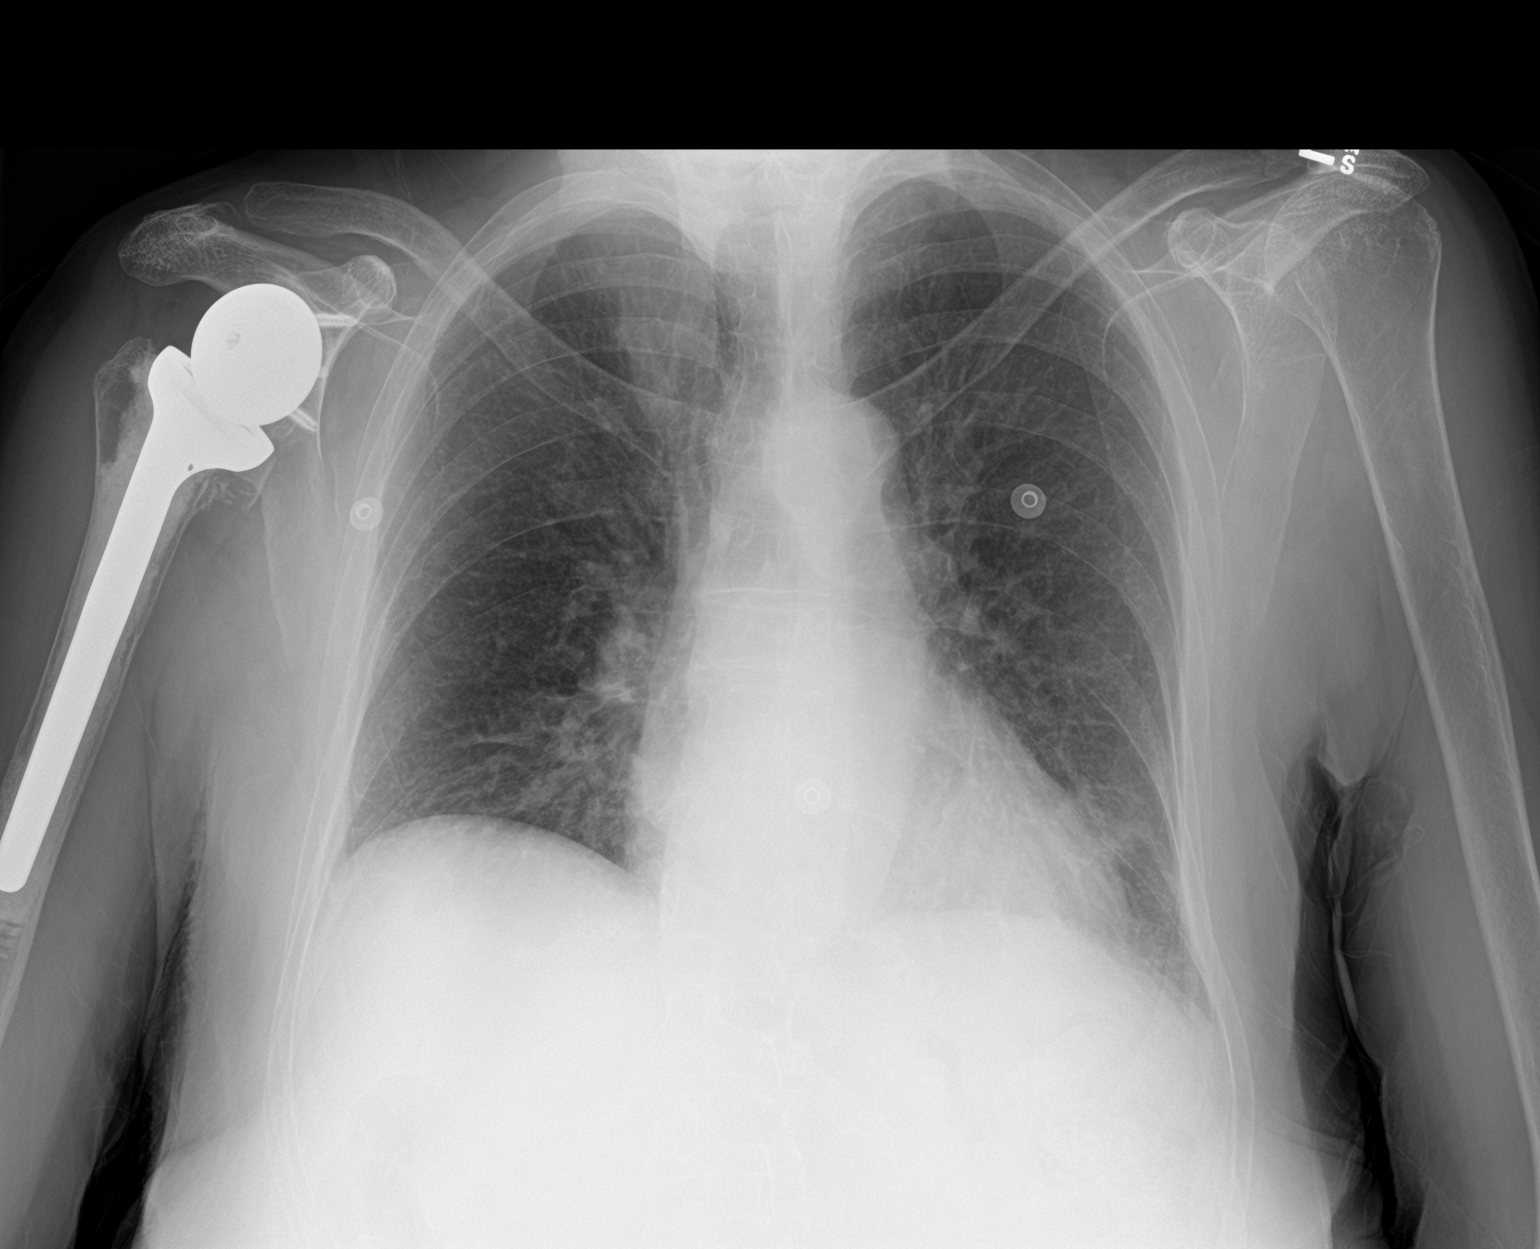

[2 of 2 positions shown; findings below may reference images not displayed]

FINDINGS: The lungs are adequately inflated. There is patchy increased density
in the lingula. There is no pleural effusion. The heart and
pulmonary vascularity are normal. There is calcification in the wall
of the aortic arch. The mediastinum is normal in width. The bony
thorax exhibits no acute abnormality. There is a prosthetic right
shoulder joint.
IMPRESSION: Subsegmental atelectasis or early pneumonia in the lingula. No CHF
or pleural effusion. Followup PA and lateral chest X-ray is
recommended in 3-4 weeks following trial of antibiotic therapy to
ensure resolution and exclude underlying malignancy.

Thoracic aortic atherosclerosis.

## 2019-01-21 IMAGING — CT CT ANGIO CHEST-ABD-PELV FOR DISSECTION W/ AND WO/W CM
2 of 7 series · 13 of 46 positions shown, 15 images · IV contrast (iopamidol)
Comparison: None.

CLINICAL DATA: Chest pain

EXAM:
CT ANGIOGRAPHY CHEST, ABDOMEN AND PELVIS
TECHNIQUE: Multidetector CT imaging through the chest, abdomen and pelvis was
performed using the standard protocol during bolus administration of
intravenous contrast. Multiplanar reconstructed images and MIPs were
obtained and reviewed to evaluate the vascular anatomy.
CONTRAST:  100mL CFF96K-64U IOPAMIDOL (CFF96K-64U) INJECTION 76%

[Series 8: dissection 2mm · axial · 0.63mm/px · z∈[+833,+1317]mm · 10 of 276 slices shown, 12 images]
[im 17/276  soft-tissue]
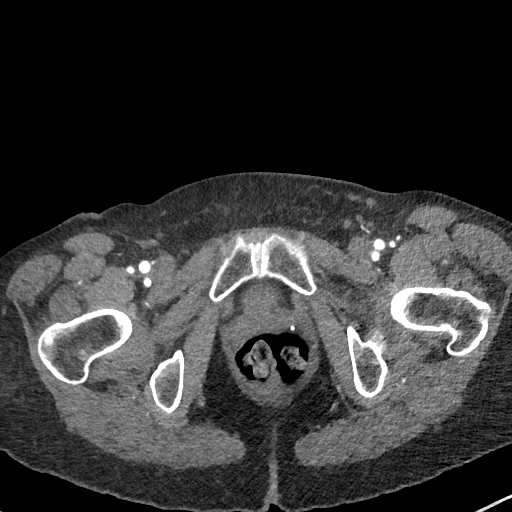
[im 17/276  bone]
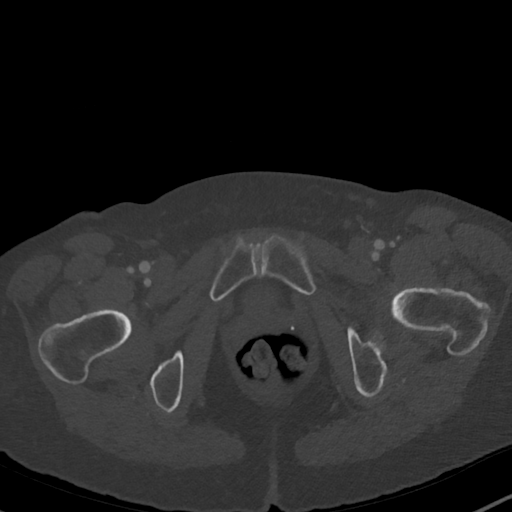
[im 49/276  soft-tissue]
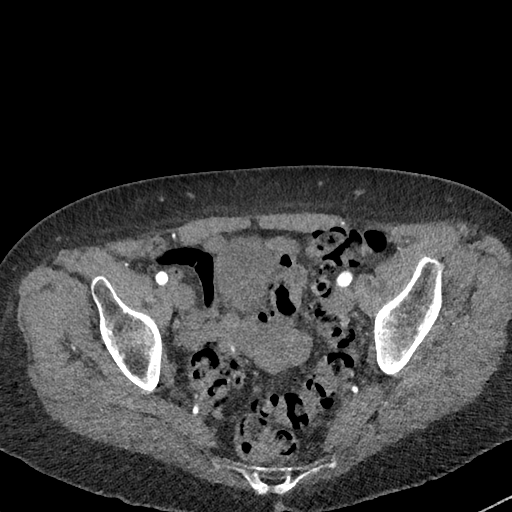
[im 81/276  soft-tissue]
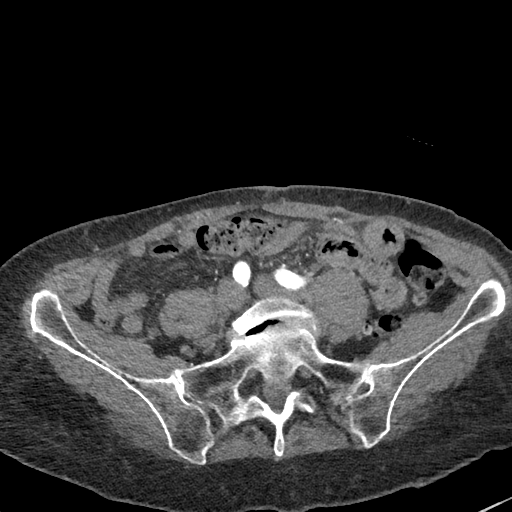
[im 98/276  soft-tissue]
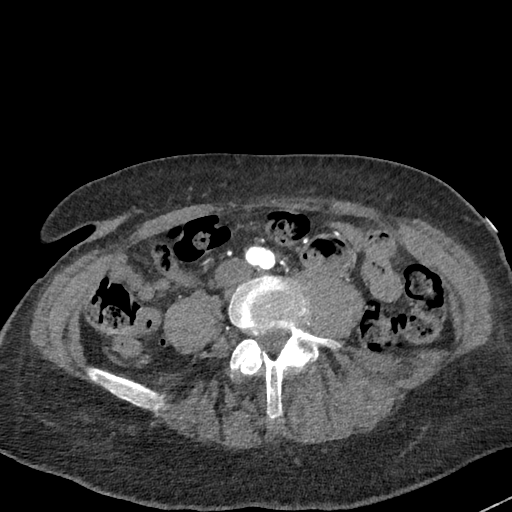
[im 130/276  soft-tissue]
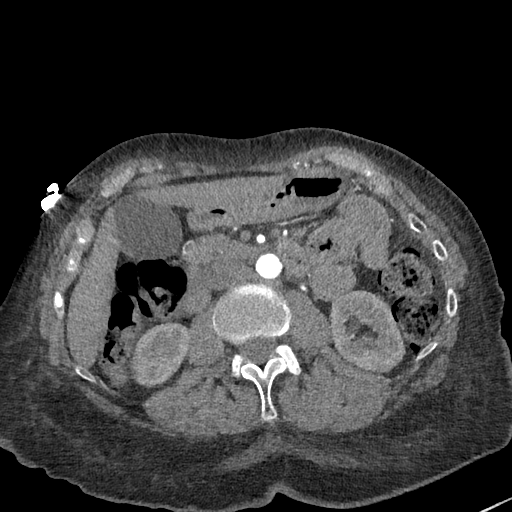
[im 146/276  soft-tissue]
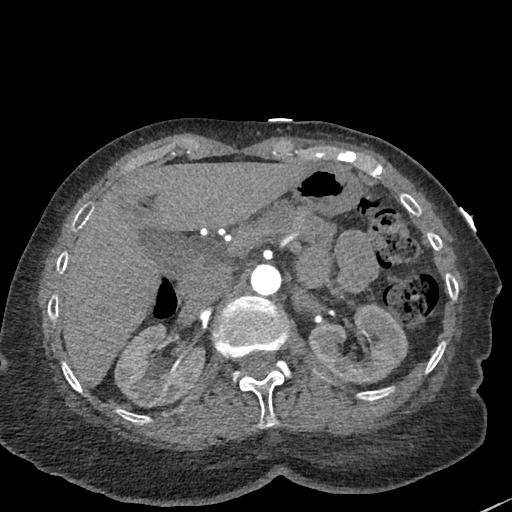
[im 178/276  soft-tissue]
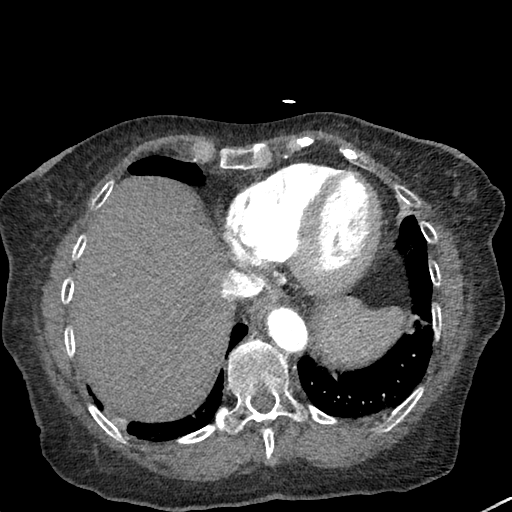
[im 211/276  soft-tissue]
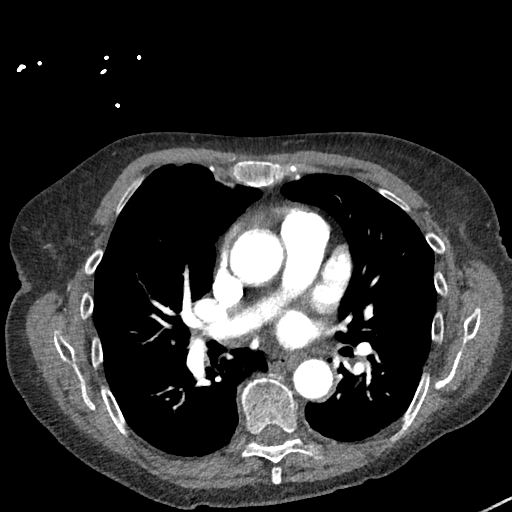
[im 227/276  soft-tissue]
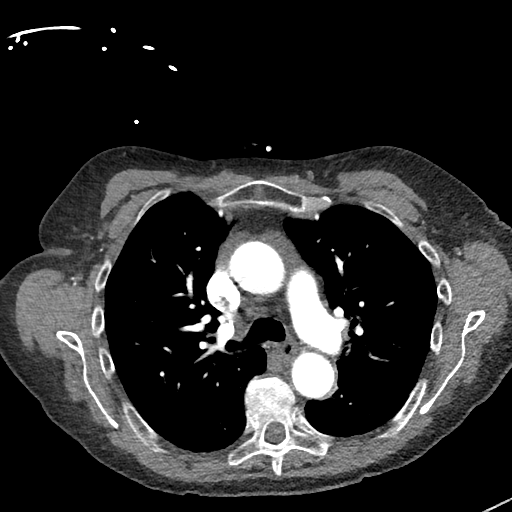
[im 227/276  bone]
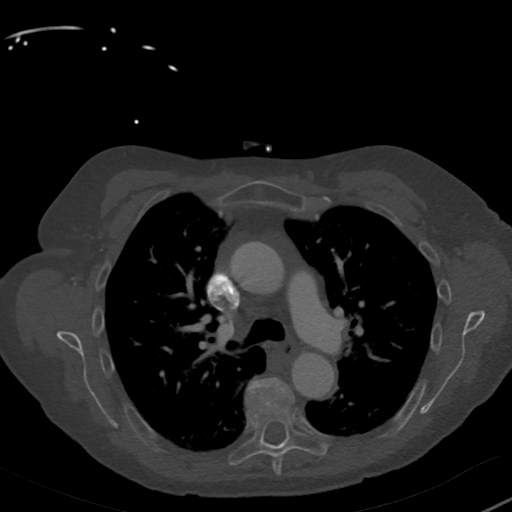
[im 259/276  soft-tissue]
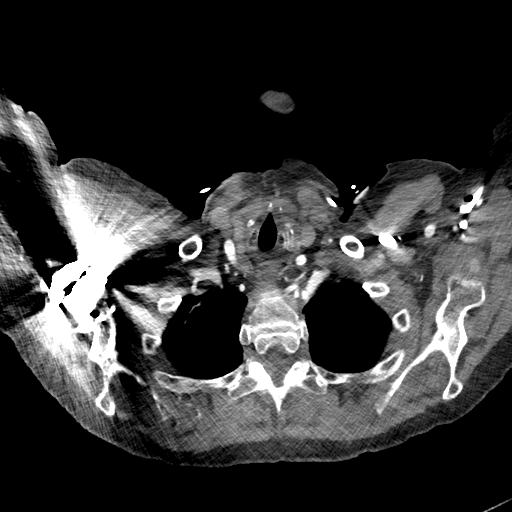

[Series 11: dissection 2mm cor · coronal · 0.65mm/px · 3 of 151 slices shown]
[im 38/151  soft-tissue]
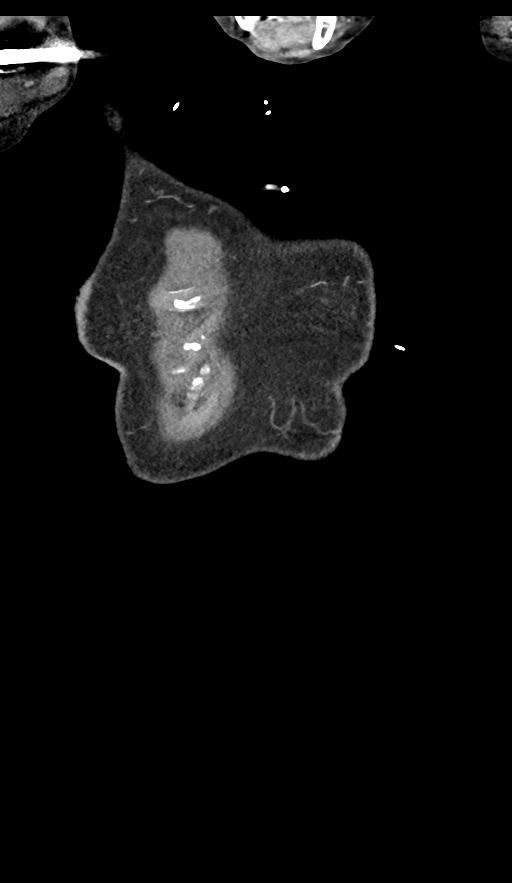
[im 76/151  soft-tissue]
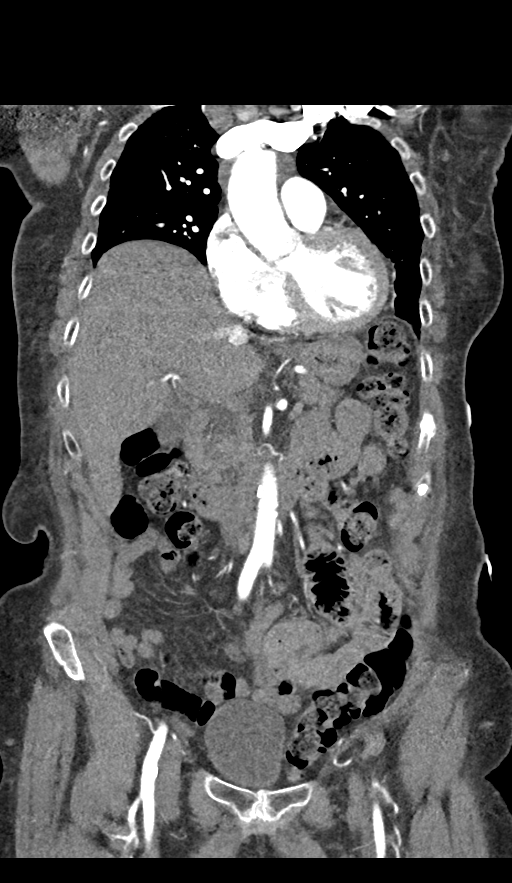
[im 113/151  soft-tissue]
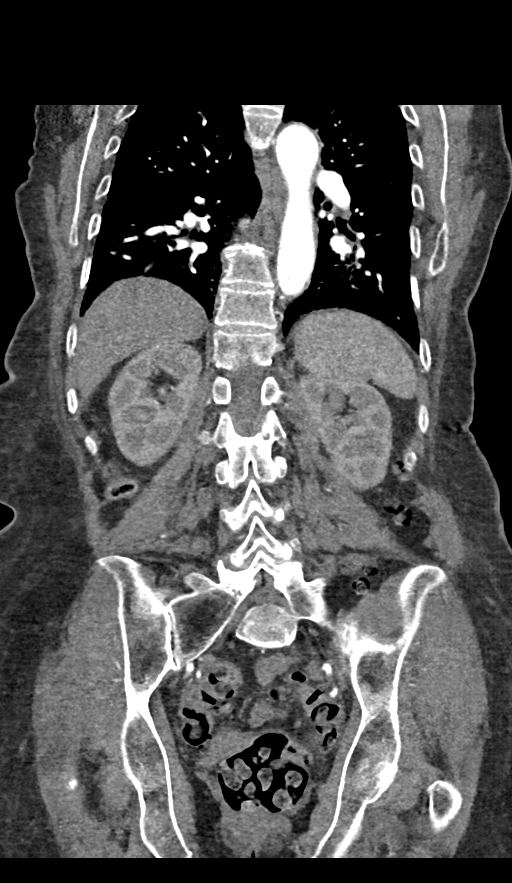

[13 of 46 positions shown; findings below may reference images not displayed]

FINDINGS: CTA CHEST FINDINGS

Cardiovascular: Heart size upper normal. No pericardial effusion. No
dense crescent in the wall of the thoracic aorta to suggest acute
intramural hematoma. No dissection of the thoracic aorta. No
thoracic aortic aneurysm. Bovine arch vessel anatomy noted
incidentally. Patient also noted to have an anomalous right superior
pulmonary vein draining directly into the posterior roof of the left
atrium. No large central pulmonary embolus.

Mediastinum/Nodes: No mediastinal lymphadenopathy. There is no hilar
lymphadenopathy. The esophagus has normal imaging features. There is
no axillary lymphadenopathy.

Lungs/Pleura: Linear scarring noted right apex. 2.3 x 0.9 cm
opacities identified along the superior aspect of the right major
fissure. Mosaic ground-glass attenuation is noted in the lungs
bilaterally. Subsegmental atelectasis evident in the lower lobes
bilaterally. No pulmonary edema down

Musculoskeletal: Old left nondisplaced rib fractures evident.

Review of the MIP images confirms the above findings.

CTA ABDOMEN AND PELVIS FINDINGS

VASCULAR

Aorta: No aneurysm. Minimal atherosclerotic plaque in the wall of
the abdominal aorta.

Celiac: Widely patent.  Normal major branch vessel anatomy.

SMA: Widely patent.

Renals: Widely patent main renal arteries bilaterally. Accessory
renal artery noted to the lower pole of the left kidney.

IMA: Patent.

Inflow: Atherosclerosis without aneurysm.

Veins: Not well evaluated given bolus timing.

Review of the MIP images confirms the above findings.

NON-VASCULAR

Hepatobiliary: No focal abnormality within the liver parenchyma.
There is no evidence for gallstones, gallbladder wall thickening, or
pericholecystic fluid. No intrahepatic or extrahepatic biliary
dilation.

Pancreas: Main pancreatic duct dilated to 7 mm.

Spleen: No splenomegaly. No focal mass lesion.

Adrenals/Urinary Tract: Thickening of the adrenal glands noted
bilaterally without mass lesion evident. No focal abnormality
evident in either kidney. No hydronephrosis no evidence for
hydroureter. The urinary bladder appears normal for the degree of
distention.

Stomach/Bowel: Stomach is nondistended. No gastric wall thickening.
No evidence of outlet obstruction. Duodenum is normally positioned
as is the ligament of Treitz. No small bowel wall thickening. No
small bowel dilatation. The terminal ileum is normal. The appendix
is normal. Diverticular changes are noted in the left colon without
evidence of diverticulitis.

Lymphatic: There is no gastrohepatic or hepatoduodenal ligament
lymphadenopathy. No intraperitoneal or retroperitoneal
lymphadenopathy. No pelvic sidewall lymphadenopathy.

Reproductive: There is no adnexal mass.

Other: No intraperitoneal free fluid.

Musculoskeletal: Bone windows reveal no worrisome lytic or sclerotic
osseous lesions.

Review of the MIP images confirms the above findings.
IMPRESSION: 1. No thoracoabdominal aortic aneurysm. No dissection or occlusion
of the thoracoabdominal aorta.
2. Main pancreatic duct dilated up to 7 mm. Pancreatic parenchyma
not well evaluated on this early arterial phase study. Dedicated
abdominal MRI without and with contrast recommended to further
evaluate as pancreatic neoplasm could present with this appearance.
Follow-up MRI should be performed after resolution of acute symptoms
so the patient is best able to cooperate with positioning and breath
holding.
3. 2.3 x 0.9 cm opacity in the posterior right upper lobe along the
major fissure. This has a somewhat platelike configuration and may
be atelectasis or scar. Follow-up CT chest without contrast
recommended in 3 months to ensure stability.
4. Right superior partial anomalous pulmonary venous return directly
to the posterior left atrium.
5.  Aortic Atherosclerois ([id number]-170.0)

## 2019-03-07 ENCOUNTER — Other Ambulatory Visit: Payer: Self-pay | Admitting: Neurology

## 2019-04-09 ENCOUNTER — Other Ambulatory Visit: Payer: Self-pay | Admitting: Neurology

## 2019-05-01 ENCOUNTER — Other Ambulatory Visit: Payer: Self-pay | Admitting: Neurology

## 2019-05-01 MED ORDER — CARBIDOPA-LEVODOPA 25-100 MG PO TABS: ORAL_TABLET | ORAL | 0 refills | Status: DC

## 2019-05-01 NOTE — Telephone Encounter (Signed)
Unable to locate a printed copy. I was able to send the refill electronically.

## 2019-05-01 NOTE — Telephone Encounter (Signed)
Refilled rx,but rx printed. Romelle Starcher, can you have Dr. Jaynee Eagles sign and fax to [phone]? Thanks!

## 2019-05-01 NOTE — Addendum Note (Signed)
Addended by: Gildardo Griffes on: 05/01/2019 04:15 PM   Modules accepted: Orders

## 2019-05-01 NOTE — Telephone Encounter (Addendum)
Pt has called for a refill on her carbidopa-levodopa (SINEMET IR) 25-100 MG tablet [facility]  Pt has scheduled her 6 mo f/u with  NP Amy for  10-20

## 2019-05-01 NOTE — Addendum Note (Signed)
Addended by: Rossie Muskrat L on: 05/01/2019 12:16 PM   Modules accepted: Orders

## 2019-05-09 ENCOUNTER — Ambulatory Visit: Payer: Medicare Other | Admitting: Family Medicine

## 2019-05-13 NOTE — Progress Notes (Signed)
PATIENT: Hailey [name] DOB: 14-Apr-1941  REASON FOR VISIT: follow up HISTORY FROM: patient  Chief Complaint  Patient presents with   Follow-up    room 6, with sister. " Nothing new."      HISTORY OF PRESENT ILLNESS: Today 05/14/19 Tylea Mckinney is a 78 y.o. female here today for follow up for PD. She continues Sinemet three times daily at 8am, 12pm and 4pm. She has some pain in right shoulder and right knee (s/p replacements of both joints). She is getting lumbar injections in her back that help with sciatica pain. She lives alone. She is able to do all ADLs independently. She uses a Corporate investment banker. No falls. She feels that memory is stable although it does take her longer to recall information. BP usually runs in 120's/60-70's at home. She has white coat syndrome. Her sister, brother-in-law and brother help her with needs around the home.  She was referred to social work at [employer] with her last visit.  She states that no one ever reached out to her.  She is not interested in seeking new referral today.  HISTORY: (copied from Dr Cathren Laine note on 09/05/2018)  Interval history 09/05/2018: She feels her tremors are improved at least the internal tremors. She is slowing down. She talks in her sleep but no acting out of dreams. She can try melatonin before bedtime. She is sedentary. Encouraged classes during the day, gave her a list of PD specific classes and will send her to a Education officer, museum at [employer] to meet. Discussed exercise and decreased progression. No falls. No difficulty swallowing. Here with daughter who says she has tried to get patient active and moving and she won;t do it. Had  Discussion, exercise is the best way to slow down progression. Called [name], will try to get her to see this PD specific social worker. Also provided information on PD of the [place], exercise classes for PD and support groups. Discussed assisted living and safety in the home. Also make sure family has POA.      Interval history; DAT scan c/w Parkinsonian disorder. Discussed in detail with patient and husband. Discussed medication management.  Recommended exercise classes. No falls in the last few weeks. Answered all questions. Discussed parkinson's disease, treatment, they had multiple questions about Sinemet. Make sure she gets skin checks to eval for skin malignancies. Discussed association between PD and melanoma.   [id number] [name] a 78 y.o.femalehere as a referral from Dr. Rudean Haskell a new problem Tremors. She was seen in the past in 2016 for dizziness which resolved an MRI of the brain was unremarkable. Here with sister in law. She feels tremulous. Brushing her teeth is difficult because her muscles dont want to work. Sister in law provides much information, everything is "slow motion", eating is slower, movements are slower overall, walking is slower but no falls. She started notciing symptoms more with BP medications a few years ago, symptoms are stable. She takes very low, speech has decreased in tone. Writing has changed, a little smaller and sloppy. No drooling or wet pillows. She says occasionally she stops when walking and has to get her legs going again and then her legs "unlock". No falls. No dizziness. No significant memory changes, age appropriate things per patient and sister-in-law. No Hallucinations or delusions. She has a resting tremor and action as well. No bvid dreams, but more trouble initiating sleep. She has toruble getting out of chairs but also has knee pain. Smell has worsened, decreased. She  denies a resting tremor, her sister in law notices a resting tremor and action tremor. No difficulty with swallowing or choking on food.    Reviewed notes, labs and imaging from outside physicians, which showed:  Review primary care notes, patient has a left hand tremor but somewhat episodic sometimes at rest with inattention lasting 5-10 minutes. No falls. No swallowing  difficulty some excess watering of the eyes. This is new, both at rest and with intention currently not symptomatic, also noted flat facies , slowed gait which may be attributable to arthritis and significant knee pain but no shuffling, no cogwheeling, evaluation for atypical Parkinson's.   Labs include see him he which showed BUN 21, creatinine 0.9 otherwise largely unremarkable, CBC unremarkable labs were drawn 12/26/2016.   REVIEW OF SYSTEMS: Out of a complete 14 system review of symptoms, the patient complains only of the following symptoms, memory loss, weakness and all other reviewed systems are negative.  ALLERGIES: Allergies  Allergen Reactions   Forteo [Parathyroid Hormone (Recomb)] Other (See Comments)    " strange feeling and I cannot describe how I felt but the Dr took me off of it"   Merthiolate [Thimerosal] Other (See Comments)    Tincture Merthiolate-skin blistering    HOME MEDICATIONS: Outpatient Medications Prior to Visit  Medication Sig Dispense Refill   acetaminophen (TYLENOL) 650 MG CR tablet Take 1,300 mg by mouth every 8 (eight) hours as needed for pain.     amoxicillin (AMOXIL) 500 MG capsule Take 2,000 mg by mouth See admin instructions. Take 4 capsules (2000 mg) by mouth one hour prior to dental appointment     Ascorbic Acid (VITAMIN C) 100 MG CHEW Chew 1 tablet by mouth daily. OTC     Calcium Carbonate-Vitamin D (CALTRATE 600+D) 600-400 MG-UNIT per tablet Take 1 tablet by mouth daily.     furosemide (LASIX) 20 MG tablet Take 20 mg by mouth daily as needed for fluid or edema.      metoprolol succinate (TOPROL-XL) 100 MG 24 hr tablet Take 100 mg by mouth daily. Take with or immediately following a meal.     Vitamin D, Ergocalciferol, (DRISDOL) 50000 units CAPS capsule Take 50,000 Units by mouth 2 (two) times a week. Tuesdays and Fridays     carbidopa-levodopa (SINEMET IR) 25-100 MG tablet TAKE 1 TABLET BY MOUTH THREE TIMES DAILY, TAKE 4 HOURS APART. DO NOT  TAKE WITH PROTEIN RICH FOODS. KEEP APPT 05/14/2019 FOR MORE REFILLS 90 tablet 0   No facility-administered medications prior to visit.     PAST MEDICAL HISTORY: Past Medical History:  Diagnosis Date   Arthritis    in shoulder , hands    Bilateral leg edema    takes lasix as needed for dependent edema    H/O exercise stress test    4-5 yrs. ago, told that it was wnl , done at [place] office    Hypertension    Osteoporosis    Parkinson disease ([place])     PAST SURGICAL HISTORY: Past Surgical History:  Procedure Laterality Date   BREAST CYST EXCISION Left 1994   benign   cataract surgery Bilateral 2007   EYE SURGERY     /w IOL   FOOT TUMOR EXCISION Left 1992   benign   MOUTH SURGERY     REVERSE SHOULDER ARTHROPLASTY Right 12/26/2013   Procedure: RIGHT SHOULDER REVERSE ARTHROPLASTY;  Surgeon: Marin Shutter, MD;  Location: [facility];  Service: Orthopedics;  Laterality: Right;   TOTAL KNEE ARTHROPLASTY  Right 04/09/2018   Procedure: RIGHT TOTAL KNEE ARTHROPLASTY;  Surgeon: Gaynelle Arabian, MD;  Location: [facility];  Service: Orthopedics;  Laterality: Right;  Adductor Block    FAMILY HISTORY: Family History  Problem Relation Age of Onset   Colon cancer Mother    Heart Problems Mother    Heart Problems Father    Heart Problems Brother    Breast cancer Cousin    Breast cancer Cousin    Breast cancer Cousin    Breast cancer Cousin    Breast cancer Cousin     SOCIAL HISTORY: Social History   Socioeconomic History   Marital status: Single    Spouse name: Not on file   Number of children: 0   Years of education: 12   Highest education level: Not on file  Occupational History   Not on file  Social Needs   Financial resource strain: Not on file   Food insecurity    Worry: Not on file    Inability: Not on file   Transportation needs    Medical: Not on file    Non-medical: Not on file  Tobacco Use   Smoking status: Former Smoker    Years:  10.00    Types: Cigarettes    Quit date: 12/06/1968    Years since quitting: 50.4   Smokeless tobacco: Never Used   Tobacco comment: tried snuff once but "never could get it wet"  Substance and Sexual Activity   Alcohol use: No    Alcohol/week: 0.0 standard drinks   Drug use: No   Sexual activity: Not on file  Lifestyle   Physical activity    Days per week: Not on file    Minutes per session: Not on file   Stress: Not on file  Relationships   Social connections    Talks on phone: Not on file    Gets together: Not on file    Attends religious service: Not on file    Active member of club or organization: Not on file    Attends meetings of clubs or organizations: Not on file    Relationship status: Not on file   Intimate partner violence    Fear of current or ex partner: Not on file    Emotionally abused: Not on file    Physically abused: Not on file    Forced sexual activity: Not on file  Other Topics Concern   Not on file  Social History Narrative   Lives at home by herself.   Right handed.   Caffeine use: 2 glasses tea/day       PHYSICAL EXAM  Vitals:   05/14/19 1322 05/14/19 1326  BP: (!) 164/75 (!) 148/67  Pulse: (!) 59   Temp: 97.8 F (36.6 C)   Weight: 150 lb 12.8 oz (68.4 kg)   Height: 5\' 5"  (1.651 m)    Body mass index is 25.09 kg/m.  Generalized: Well developed, in no acute distress  Cardiology: normal rate and rhythm, no murmur noted Neurological examination  Mentation: Alert oriented to time, place, history taking. Follows all commands speech and language fluent Cranial nerve II-XII: Pupils were equal round reactive to light. Extraocular movements were full, visual field were full on confrontational test. Facial sensation and strength were normal. Uvula tongue midline. Head turning and shoulder shrug  were normal and symmetric. Motor: The motor testing reveals 5 over 5 strength of all 4 extremities. Bradykinesia with finger taps, Good  symmetric motor tone is noted throughout.  Sensory: Sensory testing is intact to soft touch on all 4 extremities. No evidence of extinction is noted.  Coordination: Cerebellar testing reveals slow but good finger-nose-finger and heel-to-shin bilaterally.  Gait and station: Gait is normal with Rolator Tandem gait is normal with Rolator, not attempted without, Romberg is negative. No drift is seen.    DIAGNOSTIC DATA (LABS, IMAGING, TESTING) - I reviewed patient records, labs, notes, testing and imaging myself where available.  No flowsheet data found.   Lab Results  Component Value Date   WBC 8.9 04/12/2018   HGB 10.2 (L) 04/12/2018   HCT 31.1 (L) 04/12/2018   MCV 94.2 04/12/2018   PLT 252 04/12/2018      Component Value Date/Time   NA 138 04/11/2018 0454   K 4.2 04/11/2018 0454   CL 104 04/11/2018 0454   CO2 25 04/11/2018 0454   GLUCOSE 110 (H) 04/11/2018 0454   BUN 26 (H) 04/11/2018 0454   CREATININE 0.88 04/11/2018 0454   CALCIUM 9.0 04/11/2018 0454   PROT 7.0 04/03/2018 1123   ALBUMIN 4.0 04/03/2018 1123   AST 17 04/03/2018 1123   ALT <5 04/03/2018 1123   ALKPHOS 62 04/03/2018 1123   BILITOT 1.1 04/03/2018 1123   GFRNONAA >60 04/11/2018 0454   GFRAA >60 04/11/2018 0454   No results found for: CHOL, HDL, LDLCALC, LDLDIRECT, TRIG, CHOLHDL No results found for: HGBA1C No results found for: VITAMINB12 No results found for: TSH    ASSESSMENT AND PLAN 78 y.o. year old female  has a past medical history of Arthritis, Bilateral leg edema, H/O exercise stress test, Hypertension, Osteoporosis, and Parkinson disease ([place]). here with    ICD-10-CM   1. PD (Parkinson's disease) ([place])  G20   2. Gait abnormality  R26.9     Overall, Babetta feels that she is fairly stable from a Parkinson's perspective.  She is tolerating current dose of Sinemet.  We will continue Sinemet 1 tablet at 8 AM, 12 PM and 4 PM.  We have discussed increasing dose but she does not feel that this is  necessary.  I have asked that she concentrate on being more active around the home.  We have discussed referral to Janett Billow, social work with our neurology.  She did not hear back from her with previous referral.  Kimyatta is not interested at this time.  She will continue close follow-up with primary care.  I have advised that she monitor blood pressures closely at home and follow-up with primary care for consistent readings greater than 140/90.  She will follow-up with me in 6 months.  We will discuss referral to social work at that time.  Lakeya and her sister both verbalized understanding and agreement with this plan.   No orders of the defined types were placed in this encounter.    Meds ordered this encounter  Medications   carbidopa-levodopa (SINEMET IR) 25-100 MG tablet    Sig: TAKE 1 TABLET BY MOUTH THREE TIMES DAILY, TAKE 4 HOURS APART    Dispense:  270 tablet    Refill:  1    Order Specific Question:   Supervising Provider    Answer:   Melvenia Beam [id number]      I spent 25 minutes with the patient. 50% of this time was spent counseling and educating patient on plan of care and medications.    Debbora Presto, FNP-C 05/14/2019, 3:36 PM [facility] [address], [place], [place] [address] [phone]

## 2019-05-13 NOTE — Patient Instructions (Addendum)
Please continue to work on increased activity  We will continue Simemet as prescribed (1 tablet at 8am, 12pm and 4pm)  Follow up in 6 months, sooner if needed   Parkinson's Disease Parkinson's disease causes problems with movements. It is a long-term condition. It gets worse over time (is progressive). It affects each person in different ways. It makes it harder for you to:  Control how your body moves.  Move your body normally. The condition can range from mild to very bad (advanced). What are the causes? This condition results from a loss of brain cells called neurons. These brain cells make a chemical called dopamine, which is needed to control body movement. As the condition gets worse, the brain cells make less dopamine. This makes it hard to move or control your movements. The exact cause of this condition is not known. What increases the risk?  Being female.  Being age 54 or older.  Having family members who had Parkinson's disease.  Having had an injury to the brain.  Being very sad (depressed).  Being around things that are harmful or poisonous. What are the signs or symptoms? Symptoms of this condition can vary. The main symptoms have to do with movement. These include:  A tremor or shaking while you are resting that you cannot control.  Stiffness in your neck, arms, and legs.  Slowing of movement. This may include: ? Losing expressions of the face. ? Having trouble making small movements that are needed to button your clothing or brush your teeth.  Walking in a way that is not normal. You may walk with short, shuffling steps.  Loss of balance when standing. You may sway, fall backward, or have trouble making turns. Other symptoms include:  Being very sad, worried, or confused.  Seeing or hearing things that are not real.  Losing thinking abilities (dementia).  Trouble speaking or swallowing.  Having a hard time pooping (constipation).  Needing to pee  right away, peeing often, or not being able to control when you pee or poop.  Sleep problems. How is this treated? There is no cure. The goal of treatment is to manage your symptoms. Treatment may include:  Medicines.  Therapy to help with talking or movement.  Surgery to reduce shaking and other movements that you cannot control. Follow these instructions at home: Medicines  Take over-the-counter and prescription medicines only as told by your doctor.  Avoid taking pain or sleeping medicines. Eating and drinking  Follow instructions from your doctor about what you cannot eat or drink.  Do not drink alcohol. Activity  Talk with your doctor about if it is safe for you to drive.  Do exercises as told by your doctor. Lifestyle      Put in grab bars and railings in your home. These help to prevent falls.  Do not use any products that contain nicotine or tobacco, such as cigarettes, e-cigarettes, and chewing tobacco. If you need help quitting, ask your doctor.  Join a support group. General instructions  Talk with your doctor about what you need help with and what your safety needs are.  Keep all follow-up visits as told by your doctor, including any therapy visits to help with talking or moving. This is important. Contact a doctor if:  Medicines do not help your symptoms.  You feel off-balance.  You fall at home.  You need more help at home.  You have trouble swallowing.  You have a very hard time pooping.  You  have a lot of side effects from your medicines.  You feel very sad, worried, or confused. Get help right away if:  You were hurt in a fall.  You see or hear things that are not real.  You cannot swallow without choking.  You have chest pain or trouble breathing.  You do not feel safe at home.  You have thoughts about hurting yourself or others. If you ever feel like you may hurt yourself or others, or have thoughts about taking your own life,  get help right away. You can go to your nearest emergency department or call:  Your local emergency services (911 in the [place]  A suicide crisis helpline, such as the [place] at 218-095-2193. This is open 24 hours a day. Summary  This condition causes problems with movements.  It is a long-term condition. It gets worse over time.  There is no cure. Treatment focuses on managing your symptoms.  Talk with your doctor about what you need help with and what your safety needs are.  Keep all follow-up visits as told by your doctor. This is important. This information is not intended to replace advice given to you by your health care provider. Make sure you discuss any questions you have with your health care provider. Document Released: 10/03/2011 Document Revised: 09/27/2018 Document Reviewed: 09/27/2018 Elsevier Patient Education  2020 [employer].

## 2019-05-14 ENCOUNTER — Encounter: Payer: Self-pay | Admitting: Family Medicine

## 2019-05-14 ENCOUNTER — Ambulatory Visit: Payer: Medicare Other | Admitting: Family Medicine

## 2019-05-14 ENCOUNTER — Other Ambulatory Visit: Payer: Self-pay

## 2019-05-14 VITALS — BP 148/67 | HR 59 | Temp 97.8°F | Ht 65.0 in | Wt 150.8 lb

## 2019-05-14 DIAGNOSIS — R269 Unspecified abnormalities of gait and mobility: Secondary | ICD-10-CM | POA: Diagnosis not present

## 2019-05-14 DIAGNOSIS — G20 Parkinson's disease: Secondary | ICD-10-CM | POA: Diagnosis not present

## 2019-05-14 MED ORDER — CARBIDOPA-LEVODOPA 25-100 MG PO TABS: ORAL_TABLET | ORAL | 1 refills | Status: AC

## 2019-05-16 NOTE — Progress Notes (Signed)
Made any corrections needed, and agree with history, physical, neuro exam,assessment and plan as stated.     Antonia Ahern, MD [facility]  

## 2019-05-20 ENCOUNTER — Telehealth: Payer: Self-pay | Admitting: Family Medicine

## 2019-05-20 NOTE — Telephone Encounter (Signed)
I called pt and pharmacy.  Prescription is there for 90 day supply, pt just picked up one for 30 day on 05-02-19.  I explained she should be able to get 90 day supply when next needed.  She verbalized understanding.

## 2019-05-20 NOTE — Telephone Encounter (Signed)
Pt is needing a refill on her carbidopa-levodopa (SINEMET IR) 25-100 MG tablet sent to the [facility]

## 2019-06-03 ENCOUNTER — Other Ambulatory Visit: Payer: Self-pay | Admitting: Gastroenterology

## 2019-06-03 DIAGNOSIS — K862 Cyst of pancreas: Secondary | ICD-10-CM

## 2019-06-25 ENCOUNTER — Other Ambulatory Visit: Payer: Self-pay

## 2019-06-25 ENCOUNTER — Ambulatory Visit
Admission: RE | Admit: 2019-06-25 | Discharge: 2019-06-25 | Disposition: A | Discharge status: Home / Self Care | Payer: Medicare Other | Source: Ambulatory Visit | Attending: Gastroenterology | Admitting: Gastroenterology

## 2019-06-25 DIAGNOSIS — K862 Cyst of pancreas: Secondary | ICD-10-CM

## 2019-06-25 MED ORDER — GADOBENATE DIMEGLUMINE 529 MG/ML IV SOLN
12.0000 mL | Freq: Once | INTRAVENOUS | Status: AC | PRN
  Administered 2019-06-25: 12 mL via INTRAVENOUS

## 2019-07-09 ENCOUNTER — Other Ambulatory Visit: Payer: Self-pay | Admitting: Internal Medicine

## 2019-07-09 DIAGNOSIS — Z1231 Encounter for screening mammogram for malignant neoplasm of breast: Secondary | ICD-10-CM

## 2019-07-29 ENCOUNTER — Other Ambulatory Visit: Payer: Self-pay | Admitting: Gastroenterology

## 2019-08-13 ENCOUNTER — Ambulatory Visit: Payer: Medicare Other | Attending: Internal Medicine

## 2019-08-13 DIAGNOSIS — Z23 Encounter for immunization: Secondary | ICD-10-CM | POA: Insufficient documentation

## 2019-08-13 NOTE — Progress Notes (Signed)
   Covid-19 Vaccination Clinic  Name:  Hailey [name]    MRN: [id number] DOB: 1940/12/13  08/13/2019  [name] was observed post Covid-19 immunization for 15 minutes without incidence. She was provided with Vaccine Information Sheet and instruction to access the V-Safe system.   [name] was instructed to call 911 with any severe reactions post vaccine: Marland Kitchen Difficulty breathing  . Swelling of your face and throat  . A fast heartbeat  . A bad rash all over your body  . Dizziness and weakness    Immunizations Administered    Name Date Dose VIS Date Route   Pfizer COVID-19 Vaccine 08/13/2019 10:57 AM 0.3 mL 07/05/2019 Intramuscular   Manufacturer: [place]   Lot: [id number]   [place]: [id number]

## 2019-08-19 ENCOUNTER — Other Ambulatory Visit (HOSPITAL_COMMUNITY)
Admission: RE | Admit: 2019-08-19 | Discharge: 2019-08-19 | Disposition: A | Discharge status: Home / Self Care | Payer: Medicare Other | Source: Ambulatory Visit | Attending: Gastroenterology | Admitting: Gastroenterology

## 2019-08-19 DIAGNOSIS — Z20822 Contact with and (suspected) exposure to covid-19: Secondary | ICD-10-CM | POA: Diagnosis not present

## 2019-08-19 DIAGNOSIS — Z01812 Encounter for preprocedural laboratory examination: Secondary | ICD-10-CM | POA: Insufficient documentation

## 2019-08-19 LAB — SARS CORONAVIRUS 2 (TAT 6-24 HRS): SARS Coronavirus 2: NEGATIVE

## 2019-08-23 ENCOUNTER — Other Ambulatory Visit (HOSPITAL_COMMUNITY)
Admission: RE | Admit: 2019-08-23 | Discharge: 2019-08-23 | Disposition: A | Discharge status: Home / Self Care | Payer: Medicare Other | Source: Ambulatory Visit | Attending: Gastroenterology | Admitting: Gastroenterology

## 2019-08-23 DIAGNOSIS — Z20822 Contact with and (suspected) exposure to covid-19: Secondary | ICD-10-CM | POA: Diagnosis not present

## 2019-08-23 DIAGNOSIS — Z01812 Encounter for preprocedural laboratory examination: Secondary | ICD-10-CM | POA: Diagnosis present

## 2019-08-23 LAB — SARS CORONAVIRUS 2 (TAT 6-24 HRS): SARS Coronavirus 2: NEGATIVE

## 2019-08-26 ENCOUNTER — Encounter (HOSPITAL_COMMUNITY): Payer: Self-pay | Admitting: Gastroenterology

## 2019-08-26 NOTE — Progress Notes (Signed)
preop call completed, answered questions, arrival time 0930.

## 2019-08-27 ENCOUNTER — Ambulatory Visit (HOSPITAL_COMMUNITY): Payer: Medicare Other

## 2019-08-27 ENCOUNTER — Encounter (HOSPITAL_COMMUNITY): Payer: Self-pay | Admitting: Gastroenterology

## 2019-08-27 ENCOUNTER — Encounter (HOSPITAL_COMMUNITY)
Admission: RE | Disposition: A | Discharge status: Home / Self Care | Payer: Self-pay | Source: Home / Self Care | Attending: Gastroenterology

## 2019-08-27 ENCOUNTER — Ambulatory Visit (HOSPITAL_COMMUNITY): Payer: Medicare Other | Admitting: Certified Registered"

## 2019-08-27 ENCOUNTER — Other Ambulatory Visit: Payer: Self-pay

## 2019-08-27 ENCOUNTER — Ambulatory Visit (HOSPITAL_COMMUNITY)
Admission: RE | Admit: 2019-08-27 | Discharge: 2019-08-27 | Disposition: A | Discharge status: Home / Self Care | Payer: Medicare Other | Attending: Gastroenterology | Admitting: Gastroenterology

## 2019-08-27 DIAGNOSIS — Q453 Other congenital malformations of pancreas and pancreatic duct: Secondary | ICD-10-CM

## 2019-08-27 DIAGNOSIS — G20 Parkinson's disease: Secondary | ICD-10-CM | POA: Diagnosis not present

## 2019-08-27 DIAGNOSIS — M1990 Unspecified osteoarthritis, unspecified site: Secondary | ICD-10-CM | POA: Insufficient documentation

## 2019-08-27 DIAGNOSIS — Z96653 Presence of artificial knee joint, bilateral: Secondary | ICD-10-CM | POA: Diagnosis not present

## 2019-08-27 DIAGNOSIS — K831 Obstruction of bile duct: Secondary | ICD-10-CM | POA: Diagnosis not present

## 2019-08-27 DIAGNOSIS — Z96611 Presence of right artificial shoulder joint: Secondary | ICD-10-CM | POA: Insufficient documentation

## 2019-08-27 DIAGNOSIS — Z7982 Long term (current) use of aspirin: Secondary | ICD-10-CM | POA: Insufficient documentation

## 2019-08-27 DIAGNOSIS — I10 Essential (primary) hypertension: Secondary | ICD-10-CM | POA: Diagnosis not present

## 2019-08-27 DIAGNOSIS — Z79899 Other long term (current) drug therapy: Secondary | ICD-10-CM | POA: Insufficient documentation

## 2019-08-27 DIAGNOSIS — R932 Abnormal findings on diagnostic imaging of liver and biliary tract: Secondary | ICD-10-CM | POA: Insufficient documentation

## 2019-08-27 DIAGNOSIS — Z87891 Personal history of nicotine dependence: Secondary | ICD-10-CM | POA: Diagnosis not present

## 2019-08-27 HISTORY — PX: BILIARY BRUSHING: SHX6843

## 2019-08-27 HISTORY — PX: ERCP: SHX5425

## 2019-08-27 HISTORY — PX: BIOPSY: SHX5522

## 2019-08-27 HISTORY — PX: SPYGLASS CHOLANGIOSCOPY: SHX5441

## 2019-08-27 SURGERY — ERCP, WITH INTERVENTION IF INDICATED
Anesthesia: General

## 2019-08-27 MED ORDER — SODIUM CHLORIDE 0.9 % IV SOLN: INTRAVENOUS | Status: DC

## 2019-08-27 MED ORDER — SODIUM CHLORIDE 0.9 % IV SOLN
INTRAVENOUS | Status: DC | PRN
  Administered 2019-08-27: 40 mL

## 2019-08-27 MED ORDER — CIPROFLOXACIN IN D5W 400 MG/200ML IV SOLN
INTRAVENOUS | Status: DC | PRN
  Administered 2019-08-27: 400 mg via INTRAVENOUS

## 2019-08-27 MED ORDER — SUGAMMADEX SODIUM 200 MG/2ML IV SOLN
INTRAVENOUS | Status: DC | PRN
  Administered 2019-08-27: 140 mg via INTRAVENOUS

## 2019-08-27 MED ORDER — PROPOFOL 10 MG/ML IV BOLUS
INTRAVENOUS | Status: AC
  Filled 2019-08-27: qty 20

## 2019-08-27 MED ORDER — FENTANYL CITRATE (PF) 100 MCG/2ML IJ SOLN
INTRAMUSCULAR | Status: DC | PRN
  Administered 2019-08-27: 50 ug via INTRAVENOUS

## 2019-08-27 MED ORDER — GLUCAGON HCL RDNA (DIAGNOSTIC) 1 MG IJ SOLR
INTRAMUSCULAR | Status: AC
  Filled 2019-08-27: qty 1

## 2019-08-27 MED ORDER — CIPROFLOXACIN IN D5W 400 MG/200ML IV SOLN
INTRAVENOUS | Status: AC
  Filled 2019-08-27: qty 200

## 2019-08-27 MED ORDER — FENTANYL CITRATE (PF) 100 MCG/2ML IJ SOLN
INTRAMUSCULAR | Status: AC
  Filled 2019-08-27: qty 2

## 2019-08-27 MED ORDER — DEXAMETHASONE SODIUM PHOSPHATE 10 MG/ML IJ SOLN
INTRAMUSCULAR | Status: DC | PRN
  Administered 2019-08-27: 4 mg via INTRAVENOUS

## 2019-08-27 MED ORDER — ONDANSETRON HCL 4 MG/2ML IJ SOLN
INTRAMUSCULAR | Status: DC | PRN
  Administered 2019-08-27: 4 mg via INTRAVENOUS

## 2019-08-27 MED ORDER — INDOMETHACIN 50 MG RE SUPP
RECTAL | Status: AC
  Filled 2019-08-27: qty 2

## 2019-08-27 MED ORDER — LIDOCAINE 2% (20 MG/ML) 5 ML SYRINGE
INTRAMUSCULAR | Status: DC | PRN
  Administered 2019-08-27: 40 mg via INTRAVENOUS

## 2019-08-27 MED ORDER — ROCURONIUM BROMIDE 50 MG/5ML IV SOSY
PREFILLED_SYRINGE | INTRAVENOUS | Status: DC | PRN
  Administered 2019-08-27 (×2): 10 mg via INTRAVENOUS
  Administered 2019-08-27: 40 mg via INTRAVENOUS

## 2019-08-27 MED ORDER — PROPOFOL 10 MG/ML IV BOLUS
INTRAVENOUS | Status: DC | PRN
  Administered 2019-08-27: 140 mg via INTRAVENOUS

## 2019-08-27 MED ORDER — EPHEDRINE SULFATE-NACL 50-0.9 MG/10ML-% IV SOSY
PREFILLED_SYRINGE | INTRAVENOUS | Status: DC | PRN
  Administered 2019-08-27: 10 mg via INTRAVENOUS

## 2019-08-27 MED ORDER — LACTATED RINGERS IV SOLN
INTRAVENOUS | Status: DC
  Administered 2019-08-27: 11:00:00 1000 mL via INTRAVENOUS

## 2019-08-27 NOTE — Anesthesia Postprocedure Evaluation (Signed)
Anesthesia Post Note  Patient: [name]  Procedure(s) Performed: ENDOSCOPIC RETROGRADE CHOLANGIOPANCREATOGRAPHY (ERCP) (N/A ) SPYGLASS CHOLANGIOSCOPY (N/A ) BILIARY BRUSHING BIOPSY     Patient location during evaluation: Endoscopy Anesthesia Type: General Level of consciousness: awake and alert Pain management: pain level controlled Vital Signs Assessment: post-procedure vital signs reviewed and stable Respiratory status: spontaneous breathing, nonlabored ventilation and respiratory function stable Cardiovascular status: blood pressure returned to baseline and stable Postop Assessment: no apparent nausea or vomiting Anesthetic complications: no    Last Vitals:  Vitals:   08/27/19 1300 08/27/19 1310  BP: (!) 183/61 (!) 181/59  Pulse: (!) 58 (!) 55  Resp: 16 15  Temp:  36.4 C  SpO2: 93% 95%    Last Pain:  Vitals:   08/27/19 1310  TempSrc: Temporal  PainSc: 0-No pain                 Lidia Collum

## 2019-08-27 NOTE — Anesthesia Procedure Notes (Signed)
Procedure Name: Intubation Date/Time: 08/27/2019 11:21 AM Performed by: Niel Hummer, CRNA Pre-anesthesia Checklist: Patient identified, Emergency Drugs available, Suction available and Patient being monitored Patient Re-evaluated:Patient Re-evaluated prior to induction Oxygen Delivery Method: Circle system utilized Preoxygenation: Pre-oxygenation with 100% oxygen Induction Type: IV induction Ventilation: Mask ventilation without difficulty Laryngoscope Size: Mac and 4 Grade View: Grade I Tube type: Oral Tube size: 7.0 mm Number of attempts: 1 Airway Equipment and Method: Stylet Placement Confirmation: ETT inserted through vocal cords under direct vision,  positive ETCO2 and breath sounds checked- equal and bilateral Secured at: 22 cm Tube secured with: Tape Dental Injury: Teeth and Oropharynx as per pre-operative assessment

## 2019-08-27 NOTE — Transfer of Care (Signed)
Immediate Anesthesia Transfer of Care Note  Patient: Hailey [name]  Procedure(s) Performed: ENDOSCOPIC RETROGRADE CHOLANGIOPANCREATOGRAPHY (ERCP) (N/A ) SPYGLASS CHOLANGIOSCOPY (N/A )  Patient Location: PACU  Anesthesia Type:General  Level of Consciousness: awake  Airway & Oxygen Therapy: Patient Spontanous Breathing and Patient connected to face mask oxygen  Post-op Assessment: Report given to RN, Post -op Vital signs reviewed and stable and Patient moving all extremities X 4  Post vital signs: Reviewed and stable  Last Vitals:  Vitals Value Taken Time  BP    Temp    Pulse    Resp    SpO2      Last Pain:  Vitals:   08/27/19 1030  TempSrc: Oral  PainSc:          Complications: No apparent anesthesia complications

## 2019-08-27 NOTE — Progress Notes (Signed)
Hailey [name] 10:58 AM  Subjective: Patient has been doing well without any new symptoms since we saw her on telehealth recently and specifically no pain weight loss fever etc. 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

## 2019-08-27 NOTE — Op Note (Signed)
[facility] Patient Name: Hailey [name] Procedure Date: 08/27/2019 MRN: [id number] Attending MD: Clarene Essex , MD Date of Birth: 08/23/1940 CSN: [id number] Age: 79 Admit Type: Outpatient Procedure:                ERCP Indications:              Abnormal MRCP worrisome for cholangiocarcinoma Providers:                Clarene Essex, MD, Elmer Ramp. Wink, RN, [place], RN, [employer], Technician, [employer], Technician, Martinez, Technician,                            Maudry Diego, CRNA Referring MD:              Medicines:                General Anesthesia Complications:            No immediate complications. Estimated Blood Loss:     Estimated blood loss: none. Procedure:                Pre-Anesthesia Assessment:                           - Prior to the procedure, a History and Physical                            was performed, and patient medications and                            allergies were reviewed. The patient's tolerance of                            previous anesthesia was also reviewed. The risks                            and benefits of the procedure and the sedation                            options and risks were discussed with the patient.                            All questions were answered, and informed consent                            was obtained. Prior Anticoagulants: The patient has                            taken no previous anticoagulant or antiplatelet                            agents except for aspirin.  ASA Grade Assessment: II                            - A patient with mild systemic disease. After                            reviewing the risks and benefits, the patient was                            deemed in satisfactory condition to undergo the                            procedure.                           After obtaining informed consent, the scope was                        passed under direct vision. Throughout the                            procedure, the patient's blood pressure, pulse, and                            oxygen saturations were monitored continuously. The                            [id number] Olympus duodenoscope was                            introduced through the mouth, and used to inject                            contrast into and used to cannulate the bile duct.                            The ERCP was somewhat difficult due to abnormal                            anatomy. Successful completion of the procedure was                            aided by performing the maneuvers documented                            (below) in this report. The patient tolerated the                            procedure well. Scope In: Scope Out: Findings:      The major papilla was normal. Deep selective cannulation of the CBD was       readily obtained and dye was injected which did show a bifurcation       stricture and a patent cystic duct whose takeoff was fairly high and we       proceeded with  a biliary sphincterotomy was made with a Hydratome       sphincterotome using ERBE electrocautery. There was no       post-sphincterotomy bleeding. We did a medium size sphincterotomy only       and the hepatic duct bifurcation contained a single localized stenosis 3       mm in length. To discover objects, the biliary tree was swept with an       adjustable 9- 12 mm balloon starting at the bifurcation. Nothing was       found on 9 mm balloon pull-through. And we proceeded with a occlusion       cholangiogram in the customary fashion which really did not fill the       upper ducts as well as we would have liked because most of the contrast       went into the gallbladder and cells for cytology were obtained by       brushing in the hepatic duct bifurcation in the customary fashion under       fluoroscopy guidance. The bile duct was  explored endoscopically using       the SpyGlass direct visualization system. The SpyScope was advanced to       the bifurcation. Visibility with the scope was excellent. A extrinsic       impression on the hepatic duct bifurcation was seen. The hepatic duct       bifurcation was biopsied with a SpyBite miniature biopsy forceps for       histology. It seemed to be more extrinsic compression and then an       obvious intraductal lesion and we did retry to advance the wire deep       into the intrahepatics after the spyglass was removed without success       and we elected to terminate the procedure at this point and there was no       pancreatic duct injection or wire advancement throughout the procedure       and the patient tolerated the procedure well Impression:               - The major papilla appeared normal.                           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                            The stricture was indeterminate. This stricture was                            treated with biliary sphincterotomy.                           - An extrinsic impression on the biliary tract was                            seen.                           - A sphincterotomy was performed.                           - The  biliary tree was swept and nothing was found.                           - Cells for cytology obtained at the hepatic duct                            bifurcation.                           - Biopsy was performed at the hepatic duct                            bifurcation. Moderate Sedation:      Not Applicable - Patient had care per Anesthesia. Recommendation:           - Clear liquid diet for 6 hours.                           - Continue present medications.                           - Await cytology results and await path results.                           - Return to GI clinic PRN.                           - Telephone GI clinic for pathology results in 1                             week.                           - Telephone GI clinic if symptomatic PRN. Procedure Code(s):        --- Professional ---                           [id number], Endoscopic retrograde                            cholangiopancreatography (ERCP); with                            sphincterotomy/papillotomy                           43261, Endoscopic retrograde                            cholangiopancreatography (ERCP); with biopsy,                            single or multiple                           43273, Endoscopic cannulation of papilla with  direct visualization of pancreatic/common bile                            duct(s) (List separately in addition to code(s) for                            primary procedure) Diagnosis Code(s):        --- Professional ---                           K83.1, Obstruction of bile duct                           K83.9, Disease of biliary tract, unspecified                           R93.2, Abnormal findings on diagnostic imaging of                            liver and biliary tract CPT copyright 2019 [employer]. All rights reserved. The codes documented in this report are preliminary and upon coder review may  be revised to meet current compliance requirements. Clarene Essex, MD 08/27/2019 12:38:00 PM This report has been signed electronically. Number of Addenda: 0

## 2019-08-27 NOTE — Anesthesia Preprocedure Evaluation (Signed)
Anesthesia Evaluation  Patient identified by MRN, date of birth, ID band Patient awake    Reviewed: Allergy & Precautions, NPO status , Patient's Chart, lab work & pertinent test results  Airway Mallampati: I   Neck ROM: Full    Dental no notable dental hx. (+) Teeth Intact   Pulmonary former smoker,    Pulmonary exam normal breath sounds clear to auscultation       Cardiovascular hypertension, Pt. on home beta blockers and Pt. on medications Normal cardiovascular exam Rhythm:Regular Rate:Normal     Neuro/Psych negative neurological ROS  negative psych ROS   GI/Hepatic negative GI ROS, Neg liver ROS,   Endo/Other  negative endocrine ROS  Renal/GU negative Renal ROS  negative genitourinary   Musculoskeletal  (+) Arthritis , Osteoarthritis,    Abdominal Normal abdominal exam  (+)   Peds  Hematology negative hematology ROS (+)   Anesthesia Other Findings   Reproductive/Obstetrics                             Anesthesia Physical Anesthesia Plan  ASA: II  Anesthesia Plan: General   Post-op Pain Management:    Induction: Intravenous  PONV Risk Score and Plan: 3 and Ondansetron  Airway Management Planned: Oral ETT  Additional Equipment: None  Intra-op Plan:   Post-operative Plan: Extubation in OR  Informed Consent: I have reviewed the patients History and Physical, chart, labs and discussed the procedure including the risks, benefits and alternatives for the proposed anesthesia with the patient or authorized representative who has indicated his/her understanding and acceptance.     Dental advisory given  Plan Discussed with: CRNA  Anesthesia Plan Comments:         Anesthesia Quick Evaluation

## 2019-08-27 NOTE — Discharge Instructions (Signed)
YOU HAD AN ENDOSCOPIC PROCEDURE TODAY: Refer to the procedure report and other information in the discharge instructions given to you for any specific questions about what was found during the examination. If this information does not answer your questions, please call [place] at [phone] to clarify.   YOU SHOULD EXPECT: Some feelings of bloating in the abdomen. Passage of more gas than usual. Walking can help get rid of the air that was put into your GI tract during the procedure and reduce the bloating. If you had a lower endoscopy (such as a colonoscopy or flexible sigmoidoscopy) you may notice spotting of blood in your stool or on the toilet paper. Some abdominal soreness may be present for a day or two, also.  DIET: Your first meal following the procedure should be a light meal and then it is ok to progress to your normal diet. A half-sandwich or bowl of soup is an example of a good first meal. Heavy or fried foods are harder to digest and may make you feel nauseous or bloated. Drink plenty of fluids but you should avoid alcoholic beverages for 24 hours. If you had an esophageal dilation, please see attached information for diet.   ACTIVITY: Your care partner should take you home directly after the procedure. You should plan to take it easy, moving slowly for the rest of the day. You can resume normal activity the day after the procedure however YOU SHOULD NOT DRIVE, use power tools, machinery or perform tasks that involve climbing or major physical exertion for 24 hours (because of the sedation medicines used during the test).   SYMPTOMS TO REPORT IMMEDIATELY: A gastroenterologist can be reached at any hour. Please call [phone]  for any of the following symptoms:  Following lower endoscopy (colonoscopy, flexible sigmoidoscopy) Excessive amounts of blood in the stool  Significant tenderness, worsening of abdominal pains  Swelling of the abdomen that is new, acute  Fever of  100 or higher  Following upper endoscopy (EGD, EUS, ERCP, esophageal dilation) Vomiting of blood or coffee ground material  New, significant abdominal pain  New, significant chest pain or pain under the shoulder blades  Painful or persistently difficult swallowing  New shortness of breath  Black, tarry-looking or red, bloody stools  FOLLOW UP:  If any biopsies were taken you will be contacted by phone or by letter within the next 1-3 weeks. Call [phone]  if you have not heard about the biopsies in 3 weeks.  Please also call with any specific questions about appointments or follow up tests. YOU HAD AN ENDOSCOPIC PROCEDURE TODAY: Refer to the procedure report and other information in the discharge instructions given to you for any specific questions about what was found during the examination. If this information does not answer your questions, please call [facility] office at [phone] to clarify.   YOU SHOULD EXPECT: Some feelings of bloating in the abdomen. Passage of more gas than usual. Walking can help get rid of the air that was put into your GI tract during the procedure and reduce the bloating. If you had a lower endoscopy (such as a colonoscopy or flexible sigmoidoscopy) you may notice spotting of blood in your stool or on the toilet paper. Some abdominal soreness may be present for a day or two, also.  DIET: Clear liquids only until 6PM and if doing well this evening may have soft solids.  Drink plenty of fluids but you should avoid alcoholic beverages for 24 hours. If you  had a esophageal dilation, please see attached instructions for diet.    ACTIVITY: Your care partner should take you home directly after the procedure. You should plan to take it easy, moving slowly for the rest of the day. You can resume normal activity the day after the procedure however YOU SHOULD NOT DRIVE, use power tools, machinery or perform tasks that involve climbing or major physical exertion for 24  hours (because of the sedation medicines used during the test).   SYMPTOMS TO REPORT IMMEDIATELY: A gastroenterologist can be reached at any hour. Please call [phone]  for any of the following symptoms:  . Following lower endoscopy (colonoscopy, flexible sigmoidoscopy) Excessive amounts of blood in the stool  Significant tenderness, worsening of abdominal pains  Swelling of the abdomen that is new, acute  Fever of 100 or higher  . Following upper endoscopy (EGD, EUS, ERCP, esophageal dilation) Vomiting of blood or coffee ground material  New, significant abdominal pain  New, significant chest pain or pain under the shoulder blades  Painful or persistently difficult swallowing  New shortness of breath  Black, tarry-looking or red, bloody stools  FOLLOW UP:  If any biopsies were taken you will be contacted by phone or by letter within the next 1-3 weeks. Call [phone]  if you have not heard about the biopsies in 3 weeks.  Please also call with any specific questions about appointments or follow up tests. YOU HAD AN ENDOSCOPIC PROCEDURE TODAY: Refer to the procedure report and other information in the discharge instructions given to you for any specific questions about what was found during the examination. If this information does not answer your questions, please call [facility] office at [phone] to clarify.   YOU SHOULD EXPECT: Some feelings of bloating in the abdomen. Passage of more gas than usual. Walking can help get rid of the air that was put into your GI tract during the procedure and reduce the bloating. If you had a lower endoscopy (such as a colonoscopy or flexible sigmoidoscopy) you may notice spotting of blood in your stool or on the toilet paper. Some abdominal soreness may be present for a day or two, also.  DIET: Your first meal following the procedure should be a light meal and then it is ok to progress to your normal diet. A half-sandwich or bowl of soup is an  example of a good first meal. Heavy or fried foods are harder to digest and may make you feel nauseous or bloated. Drink plenty of fluids but you should avoid alcoholic beverages for 24 hours. If you had a esophageal dilation, please see attached instructions for diet.    ACTIVITY: Your care partner should take you home directly after the procedure. You should plan to take it easy, moving slowly for the rest of the day. You can resume normal activity the day after the procedure however YOU SHOULD NOT DRIVE, use power tools, machinery or perform tasks that involve climbing or major physical exertion for 24 hours (because of the sedation medicines used during the test).   SYMPTOMS TO REPORT IMMEDIATELY: A gastroenterologist can be reached at any hour. Please call [phone]  for any of the following symptoms:  . Following lower endoscopy (colonoscopy, flexible sigmoidoscopy) Excessive amounts of blood in the stool  Significant tenderness, worsening of abdominal pains  Swelling of the abdomen that is new, acute  Fever of 100 or higher  . Following upper endoscopy (EGD, EUS, ERCP, esophageal dilation) Vomiting of blood or coffee  ground material  New, significant abdominal pain  New, significant chest pain or pain under the shoulder blades  Painful or persistently difficult swallowing  New shortness of breath  Black, tarry-looking or red, bloody stools  FOLLOW UP:  If any biopsies were taken you will be contacted by phone or by letter within the next 1-3 weeks. Call [phone]  if you have not heard about the biopsies in 3 weeks.  Please also call with any specific questions about appointments or follow up tests. Clear liquids only until 6 PM and if doing well this evening may have soft solids and call if significant abdominal pain nausea vomiting signs of GI bleeding yellow jaundice or other question or problem otherwise call for biopsy report in 1 week and to discuss further work-up and  plans

## 2019-08-28 ENCOUNTER — Encounter: Payer: Self-pay | Admitting: *Deleted

## 2019-08-28 ENCOUNTER — Ambulatory Visit: Payer: Medicare Other

## 2019-08-28 LAB — SURGICAL PATHOLOGY

## 2019-08-30 LAB — CYTOLOGY - NON PAP

## 2019-09-03 ENCOUNTER — Ambulatory Visit: Payer: Medicare Other | Attending: Internal Medicine

## 2019-09-03 DIAGNOSIS — Z23 Encounter for immunization: Secondary | ICD-10-CM

## 2019-09-03 NOTE — Progress Notes (Signed)
   Covid-19 Vaccination Clinic  Name:  Hailey [name]    MRN: [id number] DOB: 1940/10/11  09/03/2019  [name] was observed post Covid-19 immunization for 15 minutes without incidence. She was provided with Vaccine Information Sheet and instruction to access the V-Safe system.   [name] was instructed to call 911 with any severe reactions post vaccine: Marland Kitchen Difficulty breathing  . Swelling of your face and throat  . A fast heartbeat  . A bad rash all over your body  . Dizziness and weakness    Immunizations Administered    Name Date Dose VIS Date Route   Pfizer COVID-19 Vaccine 09/03/2019 11:21 AM 0.3 mL 07/05/2019 Intramuscular   Manufacturer: [place]   Lot: [id number]   [place]: [id number]

## 2019-10-08 ENCOUNTER — Telehealth: Payer: Self-pay | Admitting: Family Medicine

## 2019-10-08 NOTE — Telephone Encounter (Signed)
Pt called requesting a CB to discuss medications  

## 2019-10-09 ENCOUNTER — Other Ambulatory Visit: Payer: Self-pay | Admitting: Gastroenterology

## 2019-10-09 ENCOUNTER — Other Ambulatory Visit (HOSPITAL_COMMUNITY): Payer: Self-pay | Admitting: Gastroenterology

## 2019-10-09 ENCOUNTER — Ambulatory Visit (HOSPITAL_COMMUNITY)
Admission: RE | Admit: 2019-10-09 | Discharge: 2019-10-09 | Disposition: A | Discharge status: Home / Self Care | Payer: Medicare Other | Source: Ambulatory Visit | Attending: Gastroenterology | Admitting: Gastroenterology

## 2019-10-09 ENCOUNTER — Other Ambulatory Visit: Payer: Self-pay

## 2019-10-09 DIAGNOSIS — R17 Unspecified jaundice: Secondary | ICD-10-CM

## 2019-10-09 DIAGNOSIS — Q453 Other congenital malformations of pancreas and pancreatic duct: Secondary | ICD-10-CM

## 2019-10-09 DIAGNOSIS — K862 Cyst of pancreas: Secondary | ICD-10-CM | POA: Diagnosis present

## 2019-10-09 DIAGNOSIS — R7989 Other specified abnormal findings of blood chemistry: Secondary | ICD-10-CM

## 2019-10-09 DIAGNOSIS — R948 Abnormal results of function studies of other organs and systems: Secondary | ICD-10-CM

## 2019-10-09 LAB — POCT I-STAT CREATININE: Creatinine, Ser: 0.9 mg/dL (ref 0.44–1.00)

## 2019-10-09 MED ORDER — GADOBUTROL 1 MMOL/ML IV SOLN
7.0000 mL | Freq: Once | INTRAVENOUS | Status: AC | PRN
  Administered 2019-10-09: 7 mL via INTRAVENOUS

## 2019-10-09 NOTE — Telephone Encounter (Signed)
I called pt and she stated that she had been to her pcp, Dr. Dagmar Hait, about her urine being dark,  She thought it was her carbidopa/levadopa medication.  She has been on this medication since 2018.  She stated that her pcp and GI are working together to evaluate and treat.  She appreciated call back.

## 2019-10-12 ENCOUNTER — Other Ambulatory Visit (HOSPITAL_COMMUNITY)
Admission: RE | Admit: 2019-10-12 | Discharge: 2019-10-12 | Disposition: A | Discharge status: Home / Self Care | Payer: Medicare Other | Source: Ambulatory Visit | Attending: Gastroenterology | Admitting: Gastroenterology

## 2019-10-12 DIAGNOSIS — Z01812 Encounter for preprocedural laboratory examination: Secondary | ICD-10-CM | POA: Diagnosis present

## 2019-10-12 DIAGNOSIS — Z20822 Contact with and (suspected) exposure to covid-19: Secondary | ICD-10-CM | POA: Diagnosis not present

## 2019-10-12 LAB — SARS CORONAVIRUS 2 (TAT 6-24 HRS): SARS Coronavirus 2: NEGATIVE

## 2019-10-14 ENCOUNTER — Encounter (HOSPITAL_COMMUNITY): Payer: Self-pay | Admitting: Gastroenterology

## 2019-10-14 ENCOUNTER — Other Ambulatory Visit: Payer: Self-pay

## 2019-10-14 NOTE — Progress Notes (Signed)
Pt denies SOB, chest pain and being under the care of a cardiologist. Pt stated that PCP is Dr. Dagmar Hait. Pt stated that a stress test was performed >10 years ago but denies having an echo and cardiac cath. Pt denies having an EKG and chest x ray. Pt made aware to stop taking Aspirin (unless otherwise advised by surgeon), vitamins, fish oil and herbal medications. Do not take any NSAIDs ie: Ibuprofen, Advil, Naproxen (Aleve), Motrin, BC and Goody Powder. Pt reminded to quarantine. Pt verbalized understanding of all pre-op instructions.

## 2019-10-15 ENCOUNTER — Ambulatory Visit (HOSPITAL_COMMUNITY)
Admission: RE | Admit: 2019-10-15 | Discharge: 2019-10-15 | Disposition: A | Discharge status: Home / Self Care | Payer: Medicare Other | Attending: Gastroenterology | Admitting: Gastroenterology

## 2019-10-15 ENCOUNTER — Ambulatory Visit (HOSPITAL_COMMUNITY): Payer: Medicare Other | Admitting: Certified Registered Nurse Anesthetist

## 2019-10-15 ENCOUNTER — Encounter (HOSPITAL_COMMUNITY): Payer: Self-pay | Admitting: Gastroenterology

## 2019-10-15 ENCOUNTER — Encounter (HOSPITAL_COMMUNITY)
Admission: RE | Disposition: A | Discharge status: Home / Self Care | Payer: Self-pay | Source: Home / Self Care | Attending: Gastroenterology

## 2019-10-15 ENCOUNTER — Ambulatory Visit (HOSPITAL_COMMUNITY): Payer: Medicare Other

## 2019-10-15 DIAGNOSIS — R17 Unspecified jaundice: Secondary | ICD-10-CM | POA: Diagnosis not present

## 2019-10-15 DIAGNOSIS — M1990 Unspecified osteoarthritis, unspecified site: Secondary | ICD-10-CM | POA: Insufficient documentation

## 2019-10-15 DIAGNOSIS — Z96611 Presence of right artificial shoulder joint: Secondary | ICD-10-CM | POA: Diagnosis not present

## 2019-10-15 DIAGNOSIS — Z7982 Long term (current) use of aspirin: Secondary | ICD-10-CM | POA: Insufficient documentation

## 2019-10-15 DIAGNOSIS — Z800 Family history of malignant neoplasm of digestive organs: Secondary | ICD-10-CM | POA: Insufficient documentation

## 2019-10-15 DIAGNOSIS — K839 Disease of biliary tract, unspecified: Secondary | ICD-10-CM | POA: Diagnosis not present

## 2019-10-15 DIAGNOSIS — Q453 Other congenital malformations of pancreas and pancreatic duct: Secondary | ICD-10-CM | POA: Diagnosis not present

## 2019-10-15 DIAGNOSIS — G20 Parkinson's disease: Secondary | ICD-10-CM | POA: Diagnosis not present

## 2019-10-15 DIAGNOSIS — K831 Obstruction of bile duct: Secondary | ICD-10-CM | POA: Diagnosis not present

## 2019-10-15 DIAGNOSIS — Z79899 Other long term (current) drug therapy: Secondary | ICD-10-CM | POA: Diagnosis not present

## 2019-10-15 DIAGNOSIS — I10 Essential (primary) hypertension: Secondary | ICD-10-CM | POA: Diagnosis not present

## 2019-10-15 DIAGNOSIS — Z96651 Presence of right artificial knee joint: Secondary | ICD-10-CM | POA: Insufficient documentation

## 2019-10-15 DIAGNOSIS — Z86010 Personal history of colonic polyps: Secondary | ICD-10-CM | POA: Diagnosis not present

## 2019-10-15 DIAGNOSIS — R932 Abnormal findings on diagnostic imaging of liver and biliary tract: Secondary | ICD-10-CM | POA: Diagnosis present

## 2019-10-15 DIAGNOSIS — R748 Abnormal levels of other serum enzymes: Secondary | ICD-10-CM

## 2019-10-15 HISTORY — PX: ERCP: SHX5425

## 2019-10-15 HISTORY — DX: Headache, unspecified: R51.9

## 2019-10-15 HISTORY — DX: Presence of spectacles and contact lenses: Z97.3

## 2019-10-15 HISTORY — PX: SPHINCTEROTOMY: SHX5544

## 2019-10-15 HISTORY — PX: SPYGLASS CHOLANGIOSCOPY: SHX5441

## 2019-10-15 SURGERY — ERCP, WITH INTERVENTION IF INDICATED
Anesthesia: General

## 2019-10-15 MED ORDER — SODIUM CHLORIDE 0.9 % IV SOLN
INTRAVENOUS | Status: DC | PRN
  Administered 2019-10-15: 15:00:00 70 mL

## 2019-10-15 MED ORDER — DEXAMETHASONE SODIUM PHOSPHATE 10 MG/ML IJ SOLN
INTRAMUSCULAR | Status: DC | PRN
  Administered 2019-10-15: 5 mg via INTRAVENOUS

## 2019-10-15 MED ORDER — EPHEDRINE SULFATE 50 MG/ML IJ SOLN
INTRAMUSCULAR | Status: DC | PRN
  Administered 2019-10-15: 5 mg via INTRAVENOUS

## 2019-10-15 MED ORDER — INDOMETHACIN 50 MG RE SUPP
RECTAL | Status: DC | PRN
  Administered 2019-10-15: 100 mg via RECTAL

## 2019-10-15 MED ORDER — GLUCAGON HCL RDNA (DIAGNOSTIC) 1 MG IJ SOLR
INTRAMUSCULAR | Status: AC
  Filled 2019-10-15: qty 1

## 2019-10-15 MED ORDER — ROCURONIUM BROMIDE 50 MG/5ML IV SOSY
PREFILLED_SYRINGE | INTRAVENOUS | Status: DC | PRN
  Administered 2019-10-15: 50 mg via INTRAVENOUS

## 2019-10-15 MED ORDER — LIDOCAINE 2% (20 MG/ML) 5 ML SYRINGE
INTRAMUSCULAR | Status: DC | PRN
  Administered 2019-10-15: 60 mg via INTRAVENOUS

## 2019-10-15 MED ORDER — FENTANYL CITRATE (PF) 100 MCG/2ML IJ SOLN
INTRAMUSCULAR | Status: DC | PRN
  Administered 2019-10-15 (×2): 50 ug via INTRAVENOUS

## 2019-10-15 MED ORDER — SODIUM CHLORIDE 0.9 % IV SOLN: INTRAVENOUS | Status: DC

## 2019-10-15 MED ORDER — LACTATED RINGERS IV SOLN: INTRAVENOUS | Status: DC | PRN

## 2019-10-15 MED ORDER — CIPROFLOXACIN IN D5W 400 MG/200ML IV SOLN
INTRAVENOUS | Status: AC
  Filled 2019-10-15: qty 200

## 2019-10-15 MED ORDER — ONDANSETRON HCL 4 MG/2ML IJ SOLN
INTRAMUSCULAR | Status: DC | PRN
  Administered 2019-10-15: 4 mg via INTRAVENOUS

## 2019-10-15 MED ORDER — SUGAMMADEX SODIUM 200 MG/2ML IV SOLN
INTRAVENOUS | Status: DC | PRN
  Administered 2019-10-15: 150 mg via INTRAVENOUS

## 2019-10-15 MED ORDER — PROPOFOL 10 MG/ML IV BOLUS
INTRAVENOUS | Status: DC | PRN
  Administered 2019-10-15: 120 mg via INTRAVENOUS

## 2019-10-15 MED ORDER — PHENYLEPHRINE HCL-NACL 10-0.9 MG/250ML-% IV SOLN
INTRAVENOUS | Status: DC | PRN
  Administered 2019-10-15: 20 ug/min via INTRAVENOUS

## 2019-10-15 MED ORDER — FENTANYL CITRATE (PF) 100 MCG/2ML IJ SOLN
INTRAMUSCULAR | Status: AC
  Filled 2019-10-15: qty 2

## 2019-10-15 MED ORDER — CIPROFLOXACIN IN D5W 400 MG/200ML IV SOLN
INTRAVENOUS | Status: DC | PRN
  Administered 2019-10-15: 400 mg via INTRAVENOUS

## 2019-10-15 NOTE — Op Note (Signed)
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 Estimated Blood Loss:     Estimated blood loss: none. Procedure:                Pre-Anesthesia Assessment:                           - Prior to the procedure, a History and Physical                            was performed, and patient medications and                            allergies were reviewed. The patient's tolerance of                            previous anesthesia was also reviewed. The risks                            and benefits of the procedure and the sedation                            options and risks were discussed with the patient.                            All questions were answered, and informed consent                            was obtained. Prior Anticoagulants: The patient has                            taken no previous anticoagulant or antiplatelet                            agents except for aspirin. ASA Grade Assessment: II                            - A patient with mild systemic disease. After  reviewing the risks and benefits, the patient was                            deemed in satisfactory condition to undergo the                            procedure.                           After obtaining informed consent, the scope was                            passed under direct vision. Throughout the                            procedure, the patient's blood pressure,  pulse, and                            oxygen saturations were monitored continuously. The                            [id number]-Q180V ([id number] Olympus Duodensocope was                            introduced through the mouth, and used to inject                            contrast into and used to cannulate the bile duct.                            The ERCP was technically difficult and complex due                            to abnormal anatomy. Successful completion of the                            procedure was aided by performing the maneuvers                            documented (below) in this report. The patient                            tolerated the procedure well. Scope In: Scope Out: Findings:      The major papilla was normal. Selective cannulation was readily obtained       unfortunately when we could not advance the wire deep into the duct we       did inject a little dye and were initially in the pancreatic duct and       the dye readily drained out of the pancreas and the sphincterotome was       readjusted deep selective cannulation was readily obtained and dye was       injected into the CBD and there was complete obstruction and we were       unable to advance the wire across the obstruction and  we tried the 9-12       adjustable balloon loaded with the wire and again could not get diet to       advance into the intrahepatics despite of occlusion cholangiogram and       could not advance the wire so initially tried to place spyglass but       could not cannulate within so we proceeded with a biliary sphincterotomy       was made with a Hydratome sphincterotome using ERBE electrocautery.       There was no post-sphincterotomy bleeding. We could get the half to       three quarters bowed sphincterotome easily in and out of the duct and       the common bile duct contained a single severe localized stenosis as       confirmed above. We were easily able to then cannulate with  spyglass and       the bile duct was explored endoscopically using the SpyGlass direct       visualization system. The SpyScope was advanced to the upper third of       the main bile duct. Visibility with the scope was excellent. A extrinsic       impression on the common bile duct was seen. Despite using 3 different       wires were unable to advance under direct vision or with radiology       assistance after a prolonged effort we elected to withdraw and the       procedure was terminated there was no obvious immediate complication the       patient tolerated the procedure well Impression:               - The major papilla appeared normal.                           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 The stricture was                            indeterminate. This stricture was treated with                            biliary sphincterotomy.                           - An extrinsic impression on the common bile duct                            was seen.                           - A sphincterotomy was performed. One pancreatic                            injection and wire advancement was done at the  beginning of the procedure unable to advance wire                            past the obstruction despite a prolonged effort as                            above Recommendation:           - Clear liquid diet today.                           - Continue present medications.                           - Return to GI clinic PRN.                           - Telephone GI clinic if symptomatic PRN.                           - Perform an ERCP at appointment to be scheduled by                            a different operator or.                           - Refer to an interventional radiologist at                            appointment to be scheduled for them to proceed                            with  PTC. Procedure Code(s):        --- Professional ---                           [phone], Endoscopic retrograde                            cholangiopancreatography (ERCP); with                            sphincterotomy/papillotomy                           21308, Endoscopic cannulation of papilla with                            direct visualization of pancreatic/common bile                            duct(s) (List separately in addition to code(s) for                            primary procedure) Diagnosis Code(s):        --- Professional ---  K83.1, Obstruction of bile duct                           K83.9, Disease of biliary tract, unspecified                           R17, Unspecified jaundice                           D49.0, Neoplasm of unspecified behavior of                            digestive system                           R93.2, Abnormal findings on diagnostic imaging of                            liver and biliary tract CPT copyright 2019 American Medical Association. All rights reserved. The codes documented in this report are preliminary and upon coder review may  be revised to meet current compliance requirements. Clarene Essex, MD 10/15/2019 3:09:18 PM This report has been signed electronically. Number of Addenda: 0

## 2019-10-15 NOTE — Anesthesia Preprocedure Evaluation (Signed)
Anesthesia Evaluation  Patient identified by MRN, date of birth, ID band Patient awake    Reviewed: Allergy & Precautions, NPO status , Patient's Chart, lab work & pertinent test results  History of Anesthesia Complications Negative for: history of anesthetic complications  Airway Mallampati: II  TM Distance: >3 FB Neck ROM: Full    Dental  (+) Teeth Intact   Pulmonary neg pulmonary ROS, former smoker,    Pulmonary exam normal        Cardiovascular hypertension, Normal cardiovascular exam     Neuro/Psych Parkinson's negative psych ROS   GI/Hepatic negative GI ROS, Neg liver ROS,   Endo/Other  negative endocrine ROS  Renal/GU negative Renal ROS  negative genitourinary   Musculoskeletal negative musculoskeletal ROS (+)   Abdominal   Peds  Hematology negative hematology ROS (+)   Anesthesia Other Findings   Reproductive/Obstetrics                             Anesthesia Physical Anesthesia Plan  ASA: III  Anesthesia Plan: General   Post-op Pain Management:    Induction: Intravenous  PONV Risk Score and Plan: 3 and Ondansetron, Dexamethasone, Treatment may vary due to age or medical condition and Midazolam  Airway Management Planned: Oral ETT  Additional Equipment: None  Intra-op Plan:   Post-operative Plan: Extubation in OR  Informed Consent: I have reviewed the patients History and Physical, chart, labs and discussed the procedure including the risks, benefits and alternatives for the proposed anesthesia with the patient or authorized representative who has indicated his/her understanding and acceptance.     Dental advisory given  Plan Discussed with:   Anesthesia Plan Comments:         Anesthesia Quick Evaluation

## 2019-10-15 NOTE — Progress Notes (Signed)
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

## 2019-10-15 NOTE — Transfer of Care (Signed)
Immediate Anesthesia Transfer of Care Note  Patient: Hailey [name]  Procedure(s) Performed: ENDOSCOPIC RETROGRADE CHOLANGIOPANCREATOGRAPHY (ERCP) (N/A ) REMOVAL OF STONES SPHINCTEROTOMY SPYGLASS CHOLANGIOSCOPY (N/A )  Patient Location: PACU  Anesthesia Type:General  Level of Consciousness: awake and alert   Airway & Oxygen Therapy: Patient Spontanous Breathing and Patient connected to nasal cannula oxygen  Post-op Assessment: Report given to RN and Post -op Vital signs reviewed and stable  Post vital signs: Reviewed and stable  Last Vitals:  Vitals Value Taken Time  BP 143/47 10/15/19 1510  Temp 36.1 C 10/15/19 1510  Pulse 54 10/15/19 1522  Resp 14 10/15/19 1522  SpO2 95 % 10/15/19 1522  Vitals shown include unvalidated device data.  Last Pain:  Vitals:   10/15/19 1510  TempSrc: Temporal  PainSc: Asleep         Complications: No apparent anesthesia complications

## 2019-10-15 NOTE — Anesthesia Procedure Notes (Signed)
Procedure Name: Intubation Date/Time: 10/15/2019 12:57 PM Performed by: Inda Coke, CRNA Pre-anesthesia Checklist: Patient identified, Emergency Drugs available, Suction available and Patient being monitored Patient Re-evaluated:Patient Re-evaluated prior to induction Oxygen Delivery Method: Circle System Utilized Preoxygenation: Pre-oxygenation with 100% oxygen Induction Type: IV induction Ventilation: Mask ventilation without difficulty Laryngoscope Size: Mac and 3 Grade View: Grade I Tube type: Oral Tube size: 7.0 mm Number of attempts: 1 Airway Equipment and Method: Stylet and Oral airway Placement Confirmation: ETT inserted through vocal cords under direct vision,  positive ETCO2 and breath sounds checked- equal and bilateral Secured at: 21 cm Tube secured with: Tape Dental Injury: Teeth and Oropharynx as per pre-operative assessment

## 2019-10-15 NOTE — Discharge Instructions (Signed)
Liquids only today and if doing well tomorrow may have soft solids and I will call you tomorrow to discuss the options of how to unblock your bile duct going forward either second retry by different gastroenterologist or a radiologist going through your side.   YOU HAD AN ENDOSCOPIC PROCEDURE TODAY: Refer to the procedure report and other information in the discharge instructions given to you for any specific questions about what was found during the examination. If this information does not answer your questions, please call [facility] office at [phone] to clarify.   YOU SHOULD EXPECT: Some feelings of bloating in the abdomen. Passage of more gas than usual. Walking can help get rid of the air that was put into your GI tract during the procedure and reduce the bloating.   ACTIVITY: Your care partner should take you home directly after the procedure. You should plan to take it easy, moving slowly for the rest of the day. You can resume normal activity the day after the procedure however YOU SHOULD NOT DRIVE, use power tools, machinery or perform tasks that involve climbing or major physical exertion for 24 hours (because of the sedation medicines used during the test).   SYMPTOMS TO REPORT IMMEDIATELY: A gastroenterologist can be reached at any hour. Please call [phone]  for any of the following symptoms:  . Following upper endoscopy (EGD, EUS, ERCP, esophageal dilation) Vomiting of blood or coffee ground material  New, significant abdominal pain  New, significant chest pain or pain under the shoulder blades  Painful or persistently difficult swallowing  New shortness of breath  Black, tarry-looking or red, bloody stools

## 2019-10-16 NOTE — Anesthesia Postprocedure Evaluation (Signed)
Anesthesia Post Note  Patient: Hailey [name]  Procedure(s) Performed: ENDOSCOPIC RETROGRADE CHOLANGIOPANCREATOGRAPHY (ERCP) (N/A ) REMOVAL OF STONES SPHINCTEROTOMY SPYGLASS CHOLANGIOSCOPY (N/A )     Patient location during evaluation: Endoscopy Anesthesia Type: General Level of consciousness: awake and alert Pain management: pain level controlled Vital Signs Assessment: post-procedure vital signs reviewed and stable Respiratory status: spontaneous breathing, nonlabored ventilation and respiratory function stable Cardiovascular status: blood pressure returned to baseline and stable Postop Assessment: no apparent nausea or vomiting Anesthetic complications: no    Last Vitals:  Vitals:   10/15/19 1524 10/15/19 1544  BP: (!) 138/37 (!) 149/52  Pulse: (!) 55 (!) 59  Resp: 14 12  Temp:    SpO2: 95% 98%    Last Pain:  Vitals:   10/15/19 1544  TempSrc:   PainSc: 0-No pain                 Lidia Collum

## 2019-10-17 ENCOUNTER — Other Ambulatory Visit: Payer: Self-pay | Admitting: Radiology

## 2019-10-17 ENCOUNTER — Other Ambulatory Visit (HOSPITAL_COMMUNITY): Payer: Self-pay | Admitting: Gastroenterology

## 2019-10-17 DIAGNOSIS — K831 Obstruction of bile duct: Secondary | ICD-10-CM

## 2019-10-18 ENCOUNTER — Ambulatory Visit (HOSPITAL_COMMUNITY)
Admission: RE | Admit: 2019-10-18 | Discharge: 2019-10-18 | Disposition: A | Discharge status: Home / Self Care | Payer: Medicare Other | Source: Ambulatory Visit | Attending: Gastroenterology | Admitting: Gastroenterology

## 2019-10-18 ENCOUNTER — Other Ambulatory Visit (HOSPITAL_COMMUNITY): Payer: Self-pay | Admitting: Gastroenterology

## 2019-10-18 ENCOUNTER — Other Ambulatory Visit: Payer: Self-pay

## 2019-10-18 ENCOUNTER — Encounter (HOSPITAL_COMMUNITY): Payer: Self-pay

## 2019-10-18 DIAGNOSIS — Z87891 Personal history of nicotine dependence: Secondary | ICD-10-CM | POA: Insufficient documentation

## 2019-10-18 DIAGNOSIS — K831 Obstruction of bile duct: Secondary | ICD-10-CM

## 2019-10-18 DIAGNOSIS — I10 Essential (primary) hypertension: Secondary | ICD-10-CM | POA: Diagnosis not present

## 2019-10-18 DIAGNOSIS — Z7982 Long term (current) use of aspirin: Secondary | ICD-10-CM | POA: Diagnosis not present

## 2019-10-18 DIAGNOSIS — G20 Parkinson's disease: Secondary | ICD-10-CM | POA: Diagnosis not present

## 2019-10-18 DIAGNOSIS — Z79899 Other long term (current) drug therapy: Secondary | ICD-10-CM | POA: Diagnosis not present

## 2019-10-18 HISTORY — PX: IR BILIARY DRAIN PLACEMENT WITH CHOLANGIOGRAM: IMG6043

## 2019-10-18 LAB — CBC
HCT: 33.4 % — ABNORMAL LOW (ref 36.0–46.0)
Hemoglobin: 11.5 g/dL — ABNORMAL LOW (ref 12.0–15.0)
MCH: 31 pg (ref 26.0–34.0)
MCHC: 34.4 g/dL (ref 30.0–36.0)
MCV: 90 fL (ref 80.0–100.0)
Platelets: 347 10*3/uL (ref 150–400)
RBC: 3.71 MIL/uL — ABNORMAL LOW (ref 3.87–5.11)
RDW: 18.7 % — ABNORMAL HIGH (ref 11.5–15.5)
WBC: 5.5 10*3/uL (ref 4.0–10.5)
nRBC: 0 % (ref 0.0–0.2)

## 2019-10-18 LAB — COMPREHENSIVE METABOLIC PANEL
ALT: 15 U/L (ref 0–44)
AST: 51 U/L — ABNORMAL HIGH (ref 15–41)
Albumin: 3.1 g/dL — ABNORMAL LOW (ref 3.5–5.0)
Alkaline Phosphatase: 361 U/L — ABNORMAL HIGH (ref 38–126)
Anion gap: 13 (ref 5–15)
BUN: 35 mg/dL — ABNORMAL HIGH (ref 8–23)
CO2: 23 mmol/L (ref 22–32)
Calcium: 9.6 mg/dL (ref 8.9–10.3)
Chloride: 105 mmol/L (ref 98–111)
Creatinine, Ser: 1.16 mg/dL — ABNORMAL HIGH (ref 0.44–1.00)
GFR calc Af Amer: 52 mL/min — ABNORMAL LOW (ref >60)
GFR calc non Af Amer: 45 mL/min — ABNORMAL LOW (ref >60)
Glucose, Bld: 111 mg/dL — ABNORMAL HIGH (ref 70–99)
Potassium: 3.9 mmol/L (ref 3.5–5.1)
Sodium: 141 mmol/L (ref 135–145)
Total Bilirubin: 23 mg/dL (ref 0.3–1.2)
Total Protein: 6.4 g/dL — ABNORMAL LOW (ref 6.5–8.1)

## 2019-10-18 LAB — PROTIME-INR
INR: 1 (ref 0.8–1.2)
Prothrombin Time: 12.8 seconds (ref 11.4–15.2)

## 2019-10-18 MED ORDER — FENTANYL CITRATE (PF) 100 MCG/2ML IJ SOLN
INTRAMUSCULAR | Status: AC
  Filled 2019-10-18: qty 4

## 2019-10-18 MED ORDER — NALOXONE HCL 0.4 MG/ML IJ SOLN
INTRAMUSCULAR | Status: AC
  Filled 2019-10-18: qty 1

## 2019-10-18 MED ORDER — SODIUM CHLORIDE 0.9% FLUSH: 5.0000 mL | Freq: Three times a day (TID) | INTRAVENOUS | Status: DC

## 2019-10-18 MED ORDER — IOHEXOL 300 MG/ML  SOLN
50.0000 mL | Freq: Once | INTRAMUSCULAR | Status: AC | PRN
  Administered 2019-10-18: 13:00:00 10 mL

## 2019-10-18 MED ORDER — FLUMAZENIL 0.5 MG/5ML IV SOLN
INTRAVENOUS | Status: AC
  Filled 2019-10-18: qty 5

## 2019-10-18 MED ORDER — PIPERACILLIN-TAZOBACTAM 3.375 G IVPB
3.3750 g | INTRAVENOUS | Status: DC
  Administered 2019-10-18: 3.375 g via INTRAVENOUS

## 2019-10-18 MED ORDER — SODIUM CHLORIDE 0.9 % IV SOLN: INTRAVENOUS | Status: DC

## 2019-10-18 MED ORDER — FENTANYL CITRATE (PF) 100 MCG/2ML IJ SOLN
INTRAMUSCULAR | Status: AC | PRN
  Administered 2019-10-18 (×3): 25 ug via INTRAVENOUS

## 2019-10-18 MED ORDER — PIPERACILLIN-TAZOBACTAM 3.375 G IVPB 30 MIN
3.3750 g | INTRAVENOUS | Status: DC
  Filled 2019-10-18 (×3): qty 50

## 2019-10-18 MED ORDER — HYDROCODONE-ACETAMINOPHEN 5-325 MG PO TABS: 1.0000 | ORAL_TABLET | ORAL | Status: DC | PRN

## 2019-10-18 MED ORDER — MIDAZOLAM HCL 2 MG/2ML IJ SOLN
INTRAMUSCULAR | Status: AC
  Filled 2019-10-18: qty 4

## 2019-10-18 MED ORDER — IOHEXOL 300 MG/ML  SOLN
50.0000 mL | Freq: Once | INTRAMUSCULAR | Status: AC | PRN
  Administered 2019-10-18: 12:00:00 50 mL

## 2019-10-18 MED ORDER — LIDOCAINE HCL 1 % IJ SOLN
INTRAMUSCULAR | Status: AC
  Filled 2019-10-18: qty 20

## 2019-10-18 MED ORDER — MIDAZOLAM HCL 2 MG/2ML IJ SOLN
INTRAMUSCULAR | Status: AC | PRN
  Administered 2019-10-18 (×3): 0.5 mg via INTRAVENOUS

## 2019-10-18 NOTE — Procedures (Signed)
  Procedure: Percutaneous transhepatic cholangiogram, External biliary drain placement   EBL:   minimal Complications:  none immediate  See full dictation in [employer].  Dillard Cannon MD Main # [phone] Pager  [phone]

## 2019-10-18 NOTE — Discharge Instructions (Signed)
Flush drain with 18ml  Three times a day   After flush re connect drainage tube to drainage bag Record drainage Change the dressing everyday started tomorrow     Biliary Drainage Catheter Placement, Care After This sheet gives you information about how to care for yourself after your procedure. Your health care provider may also give you more specific instructions. If you have problems or questions, contact your health care provider. What can I expect after the procedure? After the procedure, it is common to have:  Pain or soreness at the catheter insertion site.  Tiredness and sleepiness for several hours.  Some bruising at the catheter insertion site.  Drainage into the collection bag on the outside of your body, if you have an external drainage catheter. ? You might see bloody discharge in the bag for the first 1 or 2 days. ? Then, the discharge should turn a yellow-green color. Follow these instructions at home: Medicines   Take over-the-counter and prescription medicines for pain, discomfort, or fever only as told by your health care provider.  Do not take aspirin or blood thinners unless your health care provider says that you can. These can make bleeding worse.  Do not drive or use heavy machinery while taking prescription pain medicine. Catheter insertion site care  Clean the catheter insertion site as told by your health care provider.  Do not take baths, swim, or use a hot tub until your health care provider approves.  Take showers only. Before showering, cover the catheter insertion area with a watertight covering to keep the area dry.  Keep the skin around the catheter insertion site dry. If the area gets wet, dry the skin completely.  Check your catheter insertion site every day for signs of infection. Check for: ? Redness, swelling, or pain. ? Fluid or blood. ? Warmth. ? Pus or a bad smell. General instructions   Rest for the remainder of the day.  Do not  drive, use machinery, or make legal decisions for 24 hours after your procedure.  Resume your usual diet. Avoid alcoholic beverages for 24 hours after your procedure.  Keep all follow-up visits as told by your health care provider. This is important.  Drink enough fluid to keep your urine clear or pale yellow. Contact a health care provider if:  Your pain gets worse after it had improved, and it is not relieved with pain medicines.  You have any questions about caring for your drainage catheter or collection bag.  You have any of these around your catheter insertion site or coming from it: ? Skin breakdown. ? Redness, swelling, or pain. ? Fluid or blood. ? Warmth to the touch. ? Pus or a bad smell. Get help right away if:  You have a fever or chills.  Your redness, swelling, or pain at the catheter insertion site gets worse, even though you are cleaning it well.  You have leakage of bile around the drainage catheter.  Your drainage catheter becomes blocked or clogged.  Your drainage catheter comes out. This information is not intended to replace advice given to you by your health care provider. Make sure you discuss any questions you have with your health care provider. Document Revised: 06/23/2017 Document Reviewed: 05/30/2016 Elsevier Patient Education  [place]. Moderate Conscious Sedation, Adult Sedation is the use of medicines to promote relaxation and relieve discomfort and anxiety. Moderate conscious sedation is a type of sedation. Under moderate conscious sedation, you are less alert than normal,  but you are still able to respond to instructions, touch, or both. Moderate conscious sedation is used during short medical and dental procedures. It is milder than deep sedation, which is a type of sedation under which you cannot be easily woken up. It is also milder than general anesthesia, which is the use of medicines to make you unconscious. Moderate conscious  sedation allows you to return to your regular activities sooner. Tell a health care provider about:  Any allergies you have.  All medicines you are taking, including vitamins, herbs, eye drops, creams, and over-the-counter medicines.  Use of steroids (by mouth or creams).  Any problems you or family members have had with sedatives and anesthetic medicines.  Any blood disorders you have.  Any surgeries you have had.  Any medical conditions you have, such as sleep apnea.  Whether you are pregnant or may be pregnant.  Any use of cigarettes, alcohol, marijuana, or street drugs. What are the risks? Generally, this is a safe procedure. However, problems may occur, including:  Getting too much medicine (oversedation).  Nausea.  Allergic reaction to medicines.  Trouble breathing. If this happens, a breathing tube may be used to help with breathing. It will be removed when you are awake and breathing on your own.  Heart trouble.  Lung trouble. What happens before the procedure? Staying hydrated Follow instructions from your health care provider about hydration, which may include:  Up to 2 hours before the procedure - you may continue to drink clear liquids, such as water, clear fruit juice, black coffee, and plain tea. Eating and drinking restrictions Follow instructions from your health care provider about eating and drinking, which may include:  8 hours before the procedure - stop eating heavy meals or foods such as meat, fried foods, or fatty foods.  6 hours before the procedure - stop eating light meals or foods, such as toast or cereal.  6 hours before the procedure - stop drinking milk or drinks that contain milk.  2 hours before the procedure - stop drinking clear liquids. Medicine Ask your health care provider about:  Changing or stopping your regular medicines. This is especially important if you are taking diabetes medicines or blood thinners.  Taking medicines  such as aspirin and ibuprofen. These medicines can thin your blood. Do not take these medicines before your procedure if your health care provider instructs you not to.  Tests and exams  You will have a physical exam.  You may have blood tests done to show: ? How well your kidneys and liver are working. ? How well your blood can clot. General instructions  Plan to have someone take you home from the hospital or clinic.  If you will be going home right after the procedure, plan to have someone with you for 24 hours. What happens during the procedure?  An IV tube will be inserted into one of your veins.  Medicine to help you relax (sedative) will be given through the IV tube.  The medical or dental procedure will be performed. What happens after the procedure?  Your blood pressure, heart rate, breathing rate, and blood oxygen level will be monitored often until the medicines you were given have worn off.  Do not drive for 24 hours. This information is not intended to replace advice given to you by your health care provider. Make sure you discuss any questions you have with your health care provider. Document Revised: 06/23/2017 Document Reviewed: 10/31/2015 Elsevier Patient Education  [place].

## 2019-10-18 NOTE — Progress Notes (Signed)
Ambulated in hallway and to the bathroom to void. Tol well

## 2019-10-18 NOTE — H&P (Signed)
Chief Complaint: Biliary obstruction  Referring Physician(s): Magod,Marc  Supervising Physician: Arne Cleveland  Patient Status: [facility] - Out-pt  History of Present Illness: Hailey [name] is a 79 y.o. 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  Her jaundice has continued to worsen.  MR done 3/18 showed progressive biliary duct dilatation secondary to an amorphous mass within the central left hepatic lobe, highly suspicious for cholangiocarcinoma.  Her total bili today is 23.0.  She is here today for biliary drain placement, possible biopsy, possible stent.  She is NPO. She is accompanied by her Sister in [place].    Past Medical History:  Diagnosis Date  . 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 Bilateral leg edema    takes lasix as needed for dependent edema   . H/O exercise stress test    4-5 yrs. ago, told that it was wnl , done at [place] office   . Headache    MH: Migraines  . Hypertension   . Osteoporosis   . Parkinson disease ([place])   . Wears glasses     Past Surgical History:  Procedure Laterality Date  . BILIARY BRUSHING  08/27/2019   Procedure: BILIARY BRUSHING;  Surgeon: Clarene Essex, MD;  Location: [facility];  Service: Endoscopy;;  . BIOPSY  08/27/2019   Procedure: BIOPSY;  Surgeon: Clarene Essex, MD;  Location: [facility];  Service: Endoscopy;;  . BREAST CYST EXCISION Left 1994   benign  . cataract surgery Bilateral 2007  . ERCP N/A 08/27/2019   Procedure: ENDOSCOPIC RETROGRADE CHOLANGIOPANCREATOGRAPHY (ERCP);  Surgeon: Clarene Essex, MD;  Location: [facility];  Service: Endoscopy;  Laterality: N/A;  balloon sweep  . ERCP N/A 10/15/2019   Procedure: ENDOSCOPIC RETROGRADE CHOLANGIOPANCREATOGRAPHY (ERCP);  Surgeon: Clarene Essex, MD;  Location: [place];  Service: Endoscopy;  Laterality: N/A;  . EYE SURGERY     /w IOL  . FOOT TUMOR EXCISION Left 1992   benign  . MOUTH SURGERY    . REVERSE SHOULDER ARTHROPLASTY Right  12/26/2013   Procedure: RIGHT SHOULDER REVERSE ARTHROPLASTY;  Surgeon: Marin Shutter, MD;  Location: [facility];  Service: Orthopedics;  Laterality: Right;  . SPHINCTEROTOMY  10/15/2019   Procedure: SPHINCTEROTOMY;  Surgeon: Clarene Essex, MD;  Location: [facility] ENDOSCOPY;  Service: Endoscopy;;  . Bess Kinds CHOLANGIOSCOPY N/A 08/27/2019   Procedure: [id number] CHOLANGIOSCOPY;  Surgeon: Clarene Essex, MD;  Location: [facility];  Service: Endoscopy;  Laterality: N/A;  . SPYGLASS CHOLANGIOSCOPY N/A 10/15/2019   Procedure: [id number] CHOLANGIOSCOPY;  Surgeon: Clarene Essex, MD;  Location: [place];  Service: Endoscopy;  Laterality: N/A;  . TOTAL KNEE ARTHROPLASTY Right 04/09/2018   Procedure: RIGHT TOTAL KNEE ARTHROPLASTY;  Surgeon: Gaynelle Arabian, MD;  Location: [facility];  Service: Orthopedics;  Laterality: Right;  Adductor Block    Allergies: Forteo [parathyroid hormone (recomb)] and Merthiolate [thimerosal]  Medications: Prior to Admission medications   Medication Sig Start Date End Date Taking? Authorizing Provider  acetaminophen (TYLENOL) 650 MG CR tablet Take 1,300 mg by mouth 2 (two) times daily.    Yes [provider]  aspirin EC 81 MG tablet Take 81 mg by mouth daily.   Yes [provider]  Calcium Carbonate-Vitamin D (CALTRATE 600+D) 600-400 MG-UNIT per tablet Take 1 tablet by mouth daily.   Yes [provider]  carbidopa-levodopa (SINEMET IR) 25-100 MG tablet TAKE 1 TABLET BY MOUTH THREE TIMES DAILY, TAKE 4 HOURS APART Patient taking differently: Take 1 tablet by mouth 3 (three) times  daily. 0800, 1200, 1600 05/14/19  Yes Lomax, Amy, NP  furosemide (LASIX) 20 MG tablet Take 20 mg by mouth daily.    Yes [provider]  metoprolol succinate (TOPROL-XL) 100 MG 24 hr tablet Take 100 mg by mouth daily. Take with or immediately following a meal.   Yes [provider]  Polyethyl Glycol-Propyl Glycol (SYSTANE OP) Place 1 drop into both eyes daily as needed (dry eyes).    Yes [provider]  vitamin C (ASCORBIC ACID) 250 MG tablet Take 500 mg by mouth daily.   Yes [provider]  Vitamin D, Ergocalciferol, (DRISDOL) 50000 units CAPS capsule Take 50,000 Units by mouth 2 (two) times a week. Tuesdays and Fridays   Yes [provider]  amoxicillin (AMOXIL) 500 MG capsule Take 2,000 mg by mouth See admin instructions. Take 4 capsules (2000 mg) by mouth one hour prior to dental appointment 07/31/15   [provider]  guaiFENesin (MUCINEX) 600 MG 12 hr tablet Take 600 mg by mouth 2 (two) times daily as needed (congestion).    [provider]     Family History  Problem Relation Age of Onset  . Colon cancer Mother   . Heart Problems Mother   . Heart Problems Father   . Heart Problems Brother   . Breast cancer Cousin   . Breast cancer Cousin   . Breast cancer Cousin   . Breast cancer Cousin   . Breast cancer Cousin     Social History   Socioeconomic History  . Marital status: Single    Spouse name: Not on file  . Number of children: 0  . Years of education: 59  . Highest education level: Not on file  Occupational History  . Not on file  Tobacco Use  . Smoking status: Former Smoker    Years: 10.00    Types: Cigarettes    Quit date: 12/06/1968    Years since quitting: 50.8  . Smokeless tobacco: Never Used  . Tobacco comment: tried snuff once but "never could get it wet"  Substance and Sexual Activity  . Alcohol use: No    Alcohol/week: 0.0 standard drinks  . Drug use: No  . Sexual activity: Not on file  Other Topics Concern  . Not on file  Social History Narrative   Lives at home by herself.   Right handed.   Caffeine use: 2 glasses tea/day    Social Determinants of Health   Financial Resource Strain:   . Difficulty of Paying Living Expenses:   Food Insecurity:   . Worried About Charity fundraiser in the Last Year:   . Arboriculturist in the Last Year:   Transportation Needs:   . Lexicographer (Medical):   Marland Kitchen Lack of Transportation (Non-Medical):   Physical Activity:   . Days of Exercise per Week:   . Minutes of Exercise per Session:   Stress:   . Feeling of Stress :   Social Connections:   . Frequency of Communication with Friends and Family:   . Frequency of Social Gatherings with Friends and Family:   . Attends Religious Services:   . Active Member of Clubs or Organizations:   . Attends Archivist Meetings:   Marland Kitchen Marital Status:      Review of Systems: A 12 point ROS discussed and pertinent positives are indicated in the HPI above.  All other systems are negative.  Review of Systems  Vital Signs:  BP (!) 120/49   Pulse 62   Temp 98.3 F (36.8 C) (Oral)   Resp 16   Ht 5\' 5"  (1.651 m)   Wt 66.7 kg   SpO2 98%   BMI 24.46 kg/m   Physical Exam Vitals reviewed.  Constitutional:      Appearance: Normal appearance.  HENT:     Head: Normocephalic and atraumatic.  Eyes:     General: Scleral icterus present.     Extraocular Movements: Extraocular movements intact.  Cardiovascular:     Rate and Rhythm: Normal rate and regular rhythm.  Pulmonary:     Effort: Pulmonary effort is normal. No respiratory distress.     Breath sounds: Normal breath sounds.  Abdominal:     General: There is no distension.     Palpations: Abdomen is soft.     Tenderness: There is no abdominal tenderness.  Musculoskeletal:        General: Normal range of motion.  Skin:    General: Skin is warm and dry.     Coloration: Skin is jaundiced.  Neurological:     General: No focal deficit present.     Mental Status: She is alert and oriented to person, place, and time.  Psychiatric:        Mood and Affect: Mood normal.        Behavior: Behavior normal.        Thought Content: Thought content normal.        Judgment: Judgment normal.     Imaging: MR 3D Recon At Scanner  Result Date: 10/10/2019 CLINICAL DATA:  Follow-up of pancreatic lesions. EXAM: MRI ABDOMEN  WITHOUT AND WITH CONTRAST (INCLUDING MRCP) TECHNIQUE: Multiplanar multisequence MR imaging of the abdomen was performed both before and after the administration of intravenous contrast. Heavily T2-weighted images of the biliary and pancreatic ducts were obtained, and three-dimensional MRCP images were rendered by post processing. CONTRAST:  48mL GADAVIST GADOBUTROL 1 MMOL/ML IV SOLN COMPARISON:  06/25/2019 FINDINGS: Portions of exam are mildly motion degraded. Lower chest: Right lower lobe scarring. Normal heart size without pericardial or pleural effusion. Hepatobiliary: Progression of massive lateral segment left liver lobe intrahepatic biliary duct dilatation. Progression of moderate biliary duct dilatation throughout the remainder of the liver. This intrahepatic duct dilatation continues to the level of an amorphous area of heterogeneous delayed hyperenhancement within the central left hepatic lobe which measures 2.8 x 2.5 cm on 38/40. Increased from 2.1 x 1.6 cm on the prior exam (when remeasured). The common duct is normal in caliber, without choledocholithiasis. Pancreas: Complex cystic lesion within the pancreatic neck/body is similar at 3.0 x 2.4 cm on 15/5. No abnormal postcontrast enhancement. Smaller cystic lesions within the body and uncinate process are similar and of doubtful clinical significance. No acute pancreatitis. Spleen:  Normal in size, without focal abnormality. Adrenals/Urinary Tract: Normal adrenal glands. Normal kidneys, without hydronephrosis. Stomach/Bowel: Normal stomach and abdominal bowel loops. Vascular/Lymphatic: Aortic atherosclerosis. Chronic left portal vein occlusion. Retroaortic left renal vein. No abdominal adenopathy. Other:  No ascites. Musculoskeletal: No acute osseous abnormality. IMPRESSION: 1. Mildly motion degraded exam. 2. Progressive biliary duct dilatation secondary to an amorphous mass within the central left hepatic lobe, highly suspicious for cholangiocarcinoma.  Consider biliary drainage. 3. 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 Although by consensus criteria, this warrants follow-up with MRI at 1 year, given comorbidities, of questionable clinical significance. 4.  Aortic Atherosclerosis (ICD10-I70.0). Electronically Signed   By:  Abigail Miyamoto M.D.   On: 10/10/2019 08:38   DG ERCP BILIARY & PANCREATIC DUCTS  Result Date: 10/15/2019 CLINICAL DATA:  Elevated liver enzymes. EXAM: ERCP TECHNIQUE: Multiple spot images obtained with the fluoroscopic device and submitted for interpretation post-procedure. COMPARISON:  MRCP-10/09/2018 FLUOROSCOPY TIME:  26 minutes, 12 seconds FINDINGS: Single spot fluoroscopic image of the right upper abdominal quadrant is provided for review. An ERCP probe overlies the right upper abdominal quadrant. There is selective cannulation and opacification of CBD. There 4 apparent nonocclusive filling defects within the distal aspect of the CBD which could represent air bubbles. There is an apparent narrowing involving the mid aspect of the CBD with insufflation of a balloon central to this apparent narrowing with subsequent presumed biliary sweeping. There is no significant opacification of the intrahepatic biliary tree, cystic or pancreatic ducts. IMPRESSION: ERCP as above. These images were submitted for radiologic interpretation only. Please see the procedural report for the amount of contrast and the fluoroscopy time utilized. Electronically Signed   By: Sandi Mariscal M.D.   On: 10/15/2019 16:04   MR ABDOMEN MRCP W WO CONTAST  Result Date: 10/10/2019 CLINICAL DATA:  Follow-up of pancreatic lesions. EXAM: MRI ABDOMEN WITHOUT AND WITH CONTRAST (INCLUDING MRCP) TECHNIQUE: Multiplanar multisequence MR imaging of the abdomen was performed both before and after the administration of intravenous contrast. Heavily T2-weighted images of the biliary and pancreatic ducts were  obtained, and three-dimensional MRCP images were rendered by post processing. CONTRAST:  62mL GADAVIST GADOBUTROL 1 MMOL/ML IV SOLN COMPARISON:  06/25/2019 FINDINGS: Portions of exam are mildly motion degraded. Lower chest: Right lower lobe scarring. Normal heart size without pericardial or pleural effusion. Hepatobiliary: Progression of massive lateral segment left liver lobe intrahepatic biliary duct dilatation. Progression of moderate biliary duct dilatation throughout the remainder of the liver. This intrahepatic duct dilatation continues to the level of an amorphous area of heterogeneous delayed hyperenhancement within the central left hepatic lobe which measures 2.8 x 2.5 cm on 38/40. Increased from 2.1 x 1.6 cm on the prior exam (when remeasured). The common duct is normal in caliber, without choledocholithiasis. Pancreas: Complex cystic lesion within the pancreatic neck/body is similar at 3.0 x 2.4 cm on 15/5. No abnormal postcontrast enhancement. Smaller cystic lesions within the body and uncinate process are similar and of doubtful clinical significance. No acute pancreatitis. Spleen:  Normal in size, without focal abnormality. Adrenals/Urinary Tract: Normal adrenal glands. Normal kidneys, without hydronephrosis. Stomach/Bowel: Normal stomach and abdominal bowel loops. Vascular/Lymphatic: Aortic atherosclerosis. Chronic left portal vein occlusion. Retroaortic left renal vein. No abdominal adenopathy. Other:  No ascites. Musculoskeletal: No acute osseous abnormality. IMPRESSION: 1. Mildly motion degraded exam. 2. Progressive biliary duct dilatation secondary to an amorphous mass within the central left hepatic lobe, highly suspicious for cholangiocarcinoma. Consider biliary drainage. 3. 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 Although by consensus criteria, this warrants follow-up with MRI at 1 year, given comorbidities, of  questionable clinical significance. 4.  Aortic Atherosclerosis (ICD10-I70.0). Electronically Signed   By: Abigail Miyamoto M.D.   On: 10/10/2019 08:38    Labs:  CBC: Recent Labs    10/18/19 1020  WBC 5.5  HGB 11.5*  HCT 33.4*  PLT 347    COAGS: Recent Labs    10/18/19 1020  INR 1.0    BMP: Recent Labs    10/09/19 2015 10/18/19 1020  NA  --  141  K  --  3.9  CL  --  105  CO2  --  23  GLUCOSE  --  111*  BUN  --  35*  CALCIUM  --  9.6  CREATININE 0.90 1.16*  GFRNONAA  --  45*  GFRAA  --  52*    LIVER FUNCTION TESTS: Recent Labs    10/18/19 1020  BILITOT 23.0*  AST 51*  ALT 15  ALKPHOS 361*  PROT 6.4*  ALBUMIN 3.1*    TUMOR MARKERS: No results for input(s): AFPTM, CEA, CA199, CHROMGRNA in the last 8760 hours.  Assessment and Plan:  Progressive biliary duct dilatation secondary to an amorphous mass within the central left hepatic lobe, highly suspicious for cholangiocarcinoma.  Jaundice, Tbili 23.  Will proceed with biliary drain (possibly 2), possible stent,possible brushings/biopsy today by Dr. Vernard Gambles.  Risks and benefits of biliary drain discussed with the patient including, but not limited to bleeding, infection which may lead to sepsis or even death and damage to adjacent structures.  This interventional procedure involves the use of X-rays and because of the nature of the planned procedure, it is possible that we will have prolonged use of X-ray fluoroscopy.  Potential radiation risks to you include (but are not limited to) the following: - A slightly elevated risk for cancer  several years later in life. This risk is typically less than 0.5% percent. This risk is low in comparison to the normal incidence of human cancer, which is 33% for women and 50% for men according to the [place]. - Radiation induced injury can include skin redness, resembling a rash, tissue breakdown / ulcers and hair loss (which can be temporary or  permanent).   The likelihood of either of these occurring depends on the difficulty of the procedure and whether you are sensitive to radiation due to previous procedures, disease, or genetic conditions.   IF your procedure requires a prolonged use of radiation, you will be notified and given written instructions for further action.  It is your responsibility to monitor the irradiated area for the 2 weeks following the procedure and to notify your physician if you are concerned that you have suffered a radiation induced injury.    All of the patient's questions were answered, patient is agreeable to proceed.  Consent signed and in chart.  Thank you for this interesting consult.  I greatly enjoyed meeting [facility] and look forward to participating in their care.  A copy of this report was sent to the requesting provider on this date.  Electronically Signed: Murrell Redden, PA-C   10/18/2019, 11:40 AM      I spent a total of  30 Minutes in face to face in clinical consultation, greater than 50% of which was counseling/coordinating care for biliary drain.

## 2019-10-18 NOTE — Patient Instructions (Signed)
Spoke with pt who asked me to call her sister in law who is "taking care of everything." Phone call to [place], pt SIL. I asked her if they could come in earlier and she said yes. Gave instructions to check in at admitting and then come to radiology nurses station. She verbalized understanding.

## 2019-10-18 NOTE — Progress Notes (Addendum)
Discharge instructions reviewed with pt and her sister both voice understanding. Pt sister in to see drain and to do drain teaching with, both sister and pt voice understanding.

## 2019-10-21 ENCOUNTER — Other Ambulatory Visit (HOSPITAL_COMMUNITY): Payer: Self-pay | Admitting: Gastroenterology

## 2019-10-23 ENCOUNTER — Other Ambulatory Visit: Payer: Self-pay | Admitting: Student

## 2019-10-23 ENCOUNTER — Telehealth: Payer: Self-pay | Admitting: Radiology

## 2019-10-23 ENCOUNTER — Other Ambulatory Visit (HOSPITAL_COMMUNITY): Payer: Self-pay | Admitting: Gastroenterology

## 2019-10-23 ENCOUNTER — Other Ambulatory Visit: Payer: Self-pay | Admitting: Radiology

## 2019-10-23 DIAGNOSIS — R160 Hepatomegaly, not elsewhere classified: Secondary | ICD-10-CM

## 2019-10-23 DIAGNOSIS — K869 Disease of pancreas, unspecified: Secondary | ICD-10-CM

## 2019-10-23 DIAGNOSIS — R17 Unspecified jaundice: Secondary | ICD-10-CM

## 2019-10-23 NOTE — Progress Notes (Signed)
   Pancreatic lesions, jaundice, central left hepatic lobe mass with extensive intrahepatic biliary ductal dilatation. Unsuccessful ERCP Drainage.  10/18/19: imaging: IMPRESSION: 1. 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 Access across the CBD could not be achieved. 2. Technically successful right external biliary drain catheter placement. Patient will return next week after several days of external biliary decompression, for attempts at internalization of drain, possible brush biopsy, possible stenting.  Family calls to report bleeding from site Minimal bleeding-- onto gauze dressing Site is otherwise clean and dry NT No fever or chills Stat lock is "popping open"  Pt is scheduled for re evaluation and possible biopsy/stent tomorrow in IR Family is aware and agreeable  Answered all questions to satisfaction

## 2019-10-24 ENCOUNTER — Other Ambulatory Visit (HOSPITAL_COMMUNITY): Payer: Self-pay | Admitting: Gastroenterology

## 2019-10-24 ENCOUNTER — Other Ambulatory Visit: Payer: Self-pay

## 2019-10-24 ENCOUNTER — Encounter (HOSPITAL_COMMUNITY): Payer: Self-pay

## 2019-10-24 ENCOUNTER — Ambulatory Visit (HOSPITAL_COMMUNITY)
Admission: RE | Admit: 2019-10-24 | Discharge: 2019-10-24 | Disposition: A | Discharge status: Home / Self Care | Payer: Medicare Other | Source: Ambulatory Visit | Attending: Gastroenterology | Admitting: Gastroenterology

## 2019-10-24 DIAGNOSIS — R17 Unspecified jaundice: Secondary | ICD-10-CM | POA: Insufficient documentation

## 2019-10-24 DIAGNOSIS — R160 Hepatomegaly, not elsewhere classified: Secondary | ICD-10-CM | POA: Diagnosis present

## 2019-10-24 DIAGNOSIS — K860 Alcohol-induced chronic pancreatitis: Secondary | ICD-10-CM | POA: Diagnosis not present

## 2019-10-24 DIAGNOSIS — Z4803 Encounter for change or removal of drains: Secondary | ICD-10-CM | POA: Diagnosis not present

## 2019-10-24 DIAGNOSIS — K869 Disease of pancreas, unspecified: Secondary | ICD-10-CM

## 2019-10-24 HISTORY — PX: IR BILIARY DRAIN PLACEMENT WITH CHOLANGIOGRAM: IMG6043

## 2019-10-24 HISTORY — PX: IR EXCHANGE BILIARY DRAIN: IMG6046

## 2019-10-24 LAB — COMPREHENSIVE METABOLIC PANEL
ALT: 61 U/L — ABNORMAL HIGH (ref 0–44)
AST: 66 U/L — ABNORMAL HIGH (ref 15–41)
Albumin: 2.7 g/dL — ABNORMAL LOW (ref 3.5–5.0)
Alkaline Phosphatase: 246 U/L — ABNORMAL HIGH (ref 38–126)
Anion gap: 11 (ref 5–15)
BUN: 35 mg/dL — ABNORMAL HIGH (ref 8–23)
CO2: 21 mmol/L — ABNORMAL LOW (ref 22–32)
Calcium: 9.1 mg/dL (ref 8.9–10.3)
Chloride: 107 mmol/L (ref 98–111)
Creatinine, Ser: 1.13 mg/dL — ABNORMAL HIGH (ref 0.44–1.00)
GFR calc Af Amer: 54 mL/min — ABNORMAL LOW (ref >60)
GFR calc non Af Amer: 47 mL/min — ABNORMAL LOW (ref >60)
Glucose, Bld: 109 mg/dL — ABNORMAL HIGH (ref 70–99)
Potassium: 3.8 mmol/L (ref 3.5–5.1)
Sodium: 139 mmol/L (ref 135–145)
Total Bilirubin: 13.6 mg/dL — ABNORMAL HIGH (ref 0.3–1.2)
Total Protein: 6 g/dL — ABNORMAL LOW (ref 6.5–8.1)

## 2019-10-24 LAB — CBC
HCT: 31.7 % — ABNORMAL LOW (ref 36.0–46.0)
Hemoglobin: 10.5 g/dL — ABNORMAL LOW (ref 12.0–15.0)
MCH: 31.1 pg (ref 26.0–34.0)
MCHC: 33.1 g/dL (ref 30.0–36.0)
MCV: 93.8 fL (ref 80.0–100.0)
Platelets: 345 10*3/uL (ref 150–400)
RBC: 3.38 MIL/uL — ABNORMAL LOW (ref 3.87–5.11)
RDW: 19.6 % — ABNORMAL HIGH (ref 11.5–15.5)
WBC: 6.4 10*3/uL (ref 4.0–10.5)
nRBC: 0 % (ref 0.0–0.2)

## 2019-10-24 LAB — PROTIME-INR
INR: 1 (ref 0.8–1.2)
Prothrombin Time: 13.2 seconds (ref 11.4–15.2)

## 2019-10-24 MED ORDER — LIDOCAINE HCL 1 % IJ SOLN
INTRAMUSCULAR | Status: AC
  Filled 2019-10-24: qty 20

## 2019-10-24 MED ORDER — FENTANYL CITRATE (PF) 100 MCG/2ML IJ SOLN
INTRAMUSCULAR | Status: AC
  Filled 2019-10-24: qty 2

## 2019-10-24 MED ORDER — IOHEXOL 300 MG/ML  SOLN
50.0000 mL | Freq: Once | INTRAMUSCULAR | Status: AC | PRN
  Administered 2019-10-24: 11:00:00 15 mL

## 2019-10-24 MED ORDER — PIPERACILLIN-TAZOBACTAM 3.375 G IVPB
3.3750 g | INTRAVENOUS | Status: AC
  Administered 2019-10-24: 09:00:00 3.375 g via INTRAVENOUS
  Filled 2019-10-24 (×2): qty 50

## 2019-10-24 MED ORDER — MIDAZOLAM HCL 2 MG/2ML IJ SOLN
INTRAMUSCULAR | Status: AC
  Filled 2019-10-24: qty 2

## 2019-10-24 MED ORDER — MIDAZOLAM HCL 2 MG/2ML IJ SOLN
INTRAMUSCULAR | Status: AC | PRN
  Administered 2019-10-24: 1 mg via INTRAVENOUS
  Administered 2019-10-24: 0.5 mg via INTRAVENOUS

## 2019-10-24 MED ORDER — SODIUM CHLORIDE 0.9% FLUSH: 5.0000 mL | Freq: Three times a day (TID) | INTRAVENOUS | Status: DC

## 2019-10-24 MED ORDER — LIDOCAINE HCL (PF) 1 % IJ SOLN
INTRAMUSCULAR | Status: AC | PRN
  Administered 2019-10-24: 5 mL

## 2019-10-24 MED ORDER — SODIUM CHLORIDE 0.9 % IV SOLN: INTRAVENOUS | Status: DC

## 2019-10-24 MED ORDER — FENTANYL CITRATE (PF) 100 MCG/2ML IJ SOLN
INTRAMUSCULAR | Status: AC | PRN
  Administered 2019-10-24: 50 ug via INTRAVENOUS
  Administered 2019-10-24: 25 ug via INTRAVENOUS

## 2019-10-24 NOTE — Discharge Instructions (Signed)
[facility] Surgical drains are used to remove extra fluid that normally builds up in a surgical wound after surgery. A surgical drain helps to heal a surgical wound. Different kinds of surgical drains include:  Active drains. These drains use suction to pull drainage away from the surgical wound. Drainage flows through a tube to a container outside of the body. With these drains, you need to keep the bulb or the drainage container flat (compressed) at all times, except while you empty it. Flattening the bulb or container creates suction.  Passive drains. These drains allow fluid to drain naturally, by gravity. Drainage flows through a tube to a bandage (dressing) or a container outside of the body. Passive drains do not need to be emptied. A drain is placed during surgery. Right after surgery, drainage is usually bright red and a little thicker than water. The drainage may gradually turn yellow or pink and become thinner. It is likely that your health care provider will remove the drain when the drainage stops or when the amount decreases to 1-2 Tbsp (15-30 mL) during a 24-hour period. Supplies needed:  Tape.  Germ-free cleaning solution (sterile saline).  Cotton swabs.  Split gauze drain sponge: 4 x 4 inches (10 x 10 cm).  Gauze square: 4 x 4 inches (10 x 10 cm). How to care for your surgical drain Care for your drain as told by your health care provider. This is important to help prevent infection. If your drain is placed at your back, or any other hard-to-reach area, ask another person to assist you in performing the following tasks: General care  Keep the skin around the drain dry and covered with a dressing at all times.  Check your drain area every day for signs of infection. Check for: ? Redness, swelling, or pain. ? Pus or a bad smell. ? Cloudy drainage. ? Tenderness or pressure at the drain exit site. Changing the dressing Follow instructions from your health care  provider about how to change your dressing. Change your dressing at least once a day. Change it more often if needed to keep the dressing dry. Make sure you: 1. Gather your supplies. 2. Wash your hands with soap and water before you change your dressing. If soap and water are not available, use hand sanitizer. 3. Remove the old dressing. Avoid using scissors to do that. 4. Wash your hands with soap and water again after removing the old dressing. 5. Use sterile saline to clean your skin around the drain. You may need to use a cotton swab to clean the skin. 6. Place the tube through the slit in a drain sponge. Place the drain sponge so that it covers your wound. 7. Place the gauze square or another drain sponge on top of the drain sponge that is on the wound. Make sure the tube is between those layers. 8. Tape the dressing to your skin. 9. Tape the drainage tube to your skin 1-2 inches (2.5-5 cm) below the place where the tube enters your body. Taping keeps the tube from pulling on any stitches (sutures) that you have. 10. Wash your hands with soap and water. 11. Write down the color of your drainage and how often you change your dressing. How to empty your active drain  1. Make sure that you have a measuring cup that you can empty your drainage into. 2. Wash your hands with soap and water. If soap and water are not available, use hand sanitizer. 3.  with soap and water. If soap and water are not available, use hand sanitizer. 3. Loosen any pins or clips that hold the tube in place. 4. If your health care provider tells you to strip the tube to prevent clots and tube blockages: ? Hold the tube at the skin with one hand. Use your other hand to pinch the tubing with your thumb and first finger. ? Gently move your fingers down the tube while squeezing very lightly. This clears any drainage, clots, or tissue from the tube. ? You may need to do this several times each day to keep the tube clear. Do not pull on the  tube.            5. Open the bulb cap or the drain plug. Do not touch the inside of the cap or the bottom of the plug. 6. Turn the device upside down and gently squeeze. 7. Empty all of the drainage into the measuring cup. 8. Compress the bulb or the container and replace the cap or the plug. To compress the bulb or the container, squeeze it firmly in the middle while you close the cap or plug the container. 9. Write down the amount of drainage that you have in each 24-hour period. If you have less than 2 Tbsp (30 mL) of drainage during 24 hours, contact your health care provider. 10. Flush the drainage down the toilet. 11. Wash your hands with soap and water. Contact a health care provider if:  You have redness, swelling, or pain around your drain area.  You have pus or a bad smell coming from your drain area.  You have a fever or chills.  The skin around your drain is warm to the touch.  The amount of drainage that you have is increasing instead of decreasing.  You have drainage that is cloudy.  There is a sudden stop or a sudden decrease in the amount of drainage that you have.  Your drain tube falls out.  Your active drain does not stay compressed after you empty it. Summary  Surgical drains are used to remove extra fluid that normally builds up in a surgical wound after surgery.  Different kinds of surgical drains include active drains and passive drains. Active drains use suction to pull drainage away from the surgical wound, and passive drains allow fluid to drain naturally.  It is important to care for your drain to prevent infection. If your drain is placed at your back, or any other hard-to-reach area, ask another person to assist you.  Contact your health care provider if you have redness, swelling, or pain around your drain area. This information is not intended to replace advice given to you by your health care provider. Make sure you discuss any questions  you have with your health care provider. Document Revised: 08/15/2018 Document Reviewed: 08/15/2018 [employer] Patient Education  2020 [employer] Moderate Conscious Sedation, Adult, Care After These instructions provide you with information about caring for yourself after your procedure. Your health care provider may also give you more specific instructions. Your treatment has been planned according to current medical practices, but problems sometimes occur. Call your health care provider if you have any problems or questions after your procedure. What can I expect after the procedure? After your procedure, it is common:  To feel sleepy for several hours.  To feel clumsy and have poor balance for several hours.  To have poor judgment for several hours.  To vomit if you   eat too soon. Follow these instructions at home: For at least 24 hours after the procedure:   Do not: ? Participate in activities where you could fall or become injured. ? Drive. ? Use heavy machinery. ? Drink alcohol. ? Take sleeping pills or medicines that cause drowsiness. ? Make important decisions or sign legal documents. ? Take care of children on your own.  Rest. Eating and drinking  Follow the diet recommended by your health care provider.  If you vomit: ? Drink water, juice, or soup when you can drink without vomiting. ? Make sure you have little or no nausea before eating solid foods. General instructions  Have a responsible adult stay with you until you are awake and alert.  Take over-the-counter and prescription medicines only as told by your health care provider.  If you smoke, do not smoke without supervision.  Keep all follow-up visits as told by your health care provider. This is important. Contact a health care provider if:  You keep feeling nauseous or you keep vomiting.  You feel light-headed.  You develop a rash.  You have a fever. Get help right away if:  You have trouble  breathing. This information is not intended to replace advice given to you by your health care provider. Make sure you discuss any questions you have with your health care provider. Document Revised: 06/23/2017 Document Reviewed: 10/31/2015 Else[employer] Patient Education  2020 [employer]  

## 2019-10-24 NOTE — Progress Notes (Signed)
Patient and sister in law was giving discharge instructions. Both verbalized understanding.

## 2019-10-24 NOTE — Procedures (Signed)
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

## 2019-10-24 NOTE — H&P (Signed)
Chief Complaint: Patient was seen in consultation today for Biliary drain evaluation; possible biopsy and stent placement at the request of Magod,Marc  Referring Physician(s): Magod,Marc  Supervising Physician: Daryll Brod  Patient Status: [facility] - Out-pt  History of Present Illness: Hailey [name] is a 79 y.o. 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 Access across the CBD could not be achieved. 2. Technically successful right external biliary drain catheter placement. Patient will return next week after several days of external biliary decompression, for attempts at internalization of drain, possible brush biopsy, possible stenting  Scheduled now for  Biliary drain evaluation; possible internalization; possible biopsy.  Pt still with some abd pain; jaundice Nausea OP from drain has been great per pt   Past Medical History:  Diagnosis Date  . 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 Bilateral leg edema    takes lasix as needed for dependent edema   . H/O exercise stress test    4-5 yrs. ago, told that it was wnl , done at [place] office   . Headache    MH: Migraines  . Hypertension   . Osteoporosis   . Parkinson disease ([place])   . Wears glasses     Past Surgical History:  Procedure Laterality Date  . BILIARY BRUSHING  08/27/2019   Procedure: BILIARY BRUSHING;  Surgeon: Clarene Essex, MD;  Location: [facility];  Service: Endoscopy;;  . BIOPSY  08/27/2019   Procedure: BIOPSY;  Surgeon: Clarene Essex, MD;  Location: [facility];  Service: Endoscopy;;  . BREAST CYST EXCISION Left 1994   benign  . cataract surgery Bilateral 2007  . ERCP N/A 08/27/2019   Procedure: ENDOSCOPIC RETROGRADE CHOLANGIOPANCREATOGRAPHY (ERCP);  Surgeon: Clarene Essex, MD;   Location: [facility];  Service: Endoscopy;  Laterality: N/A;  balloon sweep  . ERCP N/A 10/15/2019   Procedure: ENDOSCOPIC RETROGRADE CHOLANGIOPANCREATOGRAPHY (ERCP);  Surgeon: Clarene Essex, MD;  Location: [place];  Service: Endoscopy;  Laterality: N/A;  . EYE SURGERY     /w IOL  . FOOT TUMOR EXCISION Left 1992   benign  . IR BILIARY DRAIN PLACEMENT WITH CHOLANGIOGRAM  10/18/2019  . MOUTH SURGERY    . REVERSE SHOULDER ARTHROPLASTY Right 12/26/2013   Procedure: RIGHT SHOULDER REVERSE ARTHROPLASTY;  Surgeon: Marin Shutter, MD;  Location: [place];  Service: Orthopedics;  Laterality: Right;  . SPHINCTEROTOMY  10/15/2019   Procedure: SPHINCTEROTOMY;  Surgeon: Clarene Essex, MD;  Location: [facility] ENDOSCOPY;  Service: Endoscopy;;  . Bess Kinds CHOLANGIOSCOPY N/A 08/27/2019   Procedure: [id number] CHOLANGIOSCOPY;  Surgeon: Clarene Essex, MD;  Location: [facility];  Service: Endoscopy;  Laterality: N/A;  . SPYGLASS CHOLANGIOSCOPY N/A 10/15/2019   Procedure: [id number] CHOLANGIOSCOPY;  Surgeon: Clarene Essex, MD;  Location: [place];  Service: Endoscopy;  Laterality: N/A;  . TOTAL KNEE ARTHROPLASTY Right 04/09/2018   Procedure: RIGHT TOTAL KNEE ARTHROPLASTY;  Surgeon: Gaynelle Arabian, MD;  Location: [facility];  Service: Orthopedics;  Laterality: Right;  Adductor Block    Allergies: Forteo [parathyroid hormone (recomb)] and Merthiolate [thimerosal]  Medications: Prior to Admission medications   Medication Sig Start Date End Date Taking? Authorizing Provider  acetaminophen (TYLENOL) 650 MG CR tablet Take 1,300 mg by mouth 2 (two) times daily.    Yes [provider]  aspirin EC 81 MG tablet Take 81 mg by mouth daily.  Yes [provider]  Calcium Carbonate-Vitamin D (CALTRATE 600+D) 600-400 MG-UNIT per tablet Take 1 tablet by mouth daily.   Yes [provider]  carbidopa-levodopa (SINEMET IR) 25-100 MG tablet TAKE 1 TABLET BY MOUTH THREE TIMES DAILY, TAKE 4 HOURS APART Patient taking  differently: Take 1 tablet by mouth 3 (three) times daily. 0800, 1200, 1600 05/14/19  Yes Lomax, Amy, NP  furosemide (LASIX) 20 MG tablet Take 20 mg by mouth daily.    Yes [provider]  metoprolol succinate (TOPROL-XL) 100 MG 24 hr tablet Take 100 mg by mouth daily. Take with or immediately following a meal.   Yes [provider]  Polyethyl Glycol-Propyl Glycol (SYSTANE OP) Place 1 drop into both eyes daily as needed (dry eyes).   Yes [provider]  vitamin C (ASCORBIC ACID) 250 MG tablet Take 500 mg by mouth daily.   Yes [provider]  Vitamin D, Ergocalciferol, (DRISDOL) 50000 units CAPS capsule Take 50,000 Units by mouth 2 (two) times a week. Tuesdays and Fridays   Yes [provider]  amoxicillin (AMOXIL) 500 MG capsule Take 2,000 mg by mouth See admin instructions. Take 4 capsules (2000 mg) by mouth one hour prior to dental appointment 07/31/15   [provider]  guaiFENesin (MUCINEX) 600 MG 12 hr tablet Take 600 mg by mouth 2 (two) times daily as needed (congestion).    [provider]     Family History  Problem Relation Age of Onset  . Colon cancer Mother   . Heart Problems Mother   . Heart Problems Father   . Heart Problems Brother   . Breast cancer Cousin   . Breast cancer Cousin   . Breast cancer Cousin   . Breast cancer Cousin   . Breast cancer Cousin     Social History   Socioeconomic History  . Marital status: Single    Spouse name: Not on file  . Number of children: 0  . Years of education: 35  . Highest education level: Not on file  Occupational History  . Not on file  Tobacco Use  . Smoking status: Former Smoker    Years: 10.00    Types: Cigarettes    Quit date: 12/06/1968    Years since quitting: 50.9  . Smokeless tobacco: Never Used  . Tobacco comment: tried snuff once but "never could get it wet"  Substance and Sexual Activity  . Alcohol use: No    Alcohol/week: 0.0 standard drinks  .  Drug use: No  . Sexual activity: Not on file  Other Topics Concern  . Not on file  Social History Narrative   Lives at home by herself.   Right handed.   Caffeine use: 2 glasses tea/day    Social Determinants of Health   Financial Resource Strain:   . Difficulty of Paying Living Expenses:   Food Insecurity:   . Worried About Charity fundraiser in the Last Year:   . Arboriculturist in the Last Year:   Transportation Needs:   . Film/video editor (Medical):   Marland Kitchen Lack of Transportation (Non-Medical):   Physical Activity:   . Days of Exercise per Week:   . Minutes of Exercise per Session:   Stress:   . Feeling of Stress :   Social Connections:   . Frequency of Communication with Friends and Family:   . Frequency of Social Gatherings with Friends and Family:   . Attends Religious Services:   .  Active Member of Clubs or Organizations:   . Attends Archivist Meetings:   Marland Kitchen Marital Status:     Review of Systems: A 12 point ROS discussed and pertinent positives are indicated in the HPI above.  All other systems are negative.  Review of Systems  Constitutional: Positive for appetite change, fatigue and unexpected weight change. Negative for fever.  Respiratory: Negative for shortness of breath.   Cardiovascular: Negative for chest pain.  Gastrointestinal: Positive for abdominal pain and nausea. Negative for abdominal distention.  Neurological: Positive for weakness.  Psychiatric/Behavioral: Negative for behavioral problems and confusion.    Vital Signs: BP (!) 121/50   Pulse 70   Temp (!) 97.2 F (36.2 C) (Skin)   Resp 16   Ht 5\' 5"  (1.651 m)   Wt 147 lb (66.7 kg)   SpO2 100%   BMI 24.46 kg/m   Physical Exam Vitals reviewed.  HENT:     Mouth/Throat:     Mouth: Mucous membranes are moist.  Eyes:     General: Scleral icterus present.  Cardiovascular:     Rate and Rhythm: Normal rate and regular rhythm.     Heart sounds: Normal heart sounds.    Pulmonary:     Breath sounds: Normal breath sounds.  Abdominal:     Palpations: Abdomen is soft.     Tenderness: There is abdominal tenderness.  Musculoskeletal:        General: Normal range of motion.  Skin:    General: Skin is warm and dry.     Comments: Drain site is clean and dry NT no bleeding  Stat lock and dressing removed to visualize site  OP dark bile in bag  Neurological:     Mental Status: She is alert and oriented to person, place, and time.  Psychiatric:        Behavior: Behavior normal.        Thought Content: Thought content normal.        Judgment: Judgment normal.     Imaging: MR 3D Recon At Scanner  Result Date: 10/10/2019 CLINICAL DATA:  Follow-up of pancreatic lesions. EXAM: MRI ABDOMEN WITHOUT AND WITH CONTRAST (INCLUDING MRCP) TECHNIQUE: Multiplanar multisequence MR imaging of the abdomen was performed both before and after the administration of intravenous contrast. Heavily T2-weighted images of the biliary and pancreatic ducts were obtained, and three-dimensional MRCP images were rendered by post processing. CONTRAST:  38mL GADAVIST GADOBUTROL 1 MMOL/ML IV SOLN COMPARISON:  06/25/2019 FINDINGS: Portions of exam are mildly motion degraded. Lower chest: Right lower lobe scarring. Normal heart size without pericardial or pleural effusion. Hepatobiliary: Progression of massive lateral segment left liver lobe intrahepatic biliary duct dilatation. Progression of moderate biliary duct dilatation throughout the remainder of the liver. This intrahepatic duct dilatation continues to the level of an amorphous area of heterogeneous delayed hyperenhancement within the central left hepatic lobe which measures 2.8 x 2.5 cm on 38/40. Increased from 2.1 x 1.6 cm on the prior exam (when remeasured). The common duct is normal in caliber, without choledocholithiasis. Pancreas: Complex cystic lesion within the pancreatic neck/body is similar at 3.0 x 2.4 cm on 15/5. No abnormal  postcontrast enhancement. Smaller cystic lesions within the body and uncinate process are similar and of doubtful clinical significance. No acute pancreatitis. Spleen:  Normal in size, without focal abnormality. Adrenals/Urinary Tract: Normal adrenal glands. Normal kidneys, without hydronephrosis. Stomach/Bowel: Normal stomach and abdominal bowel loops. Vascular/Lymphatic: Aortic atherosclerosis. Chronic left portal vein occlusion. Retroaortic left renal vein.  No abdominal adenopathy. Other:  No ascites. Musculoskeletal: No acute osseous abnormality. IMPRESSION: 1. Mildly motion degraded exam. 2. Progressive biliary duct dilatation secondary to an amorphous mass within the central left hepatic lobe, highly suspicious for cholangiocarcinoma. Consider biliary drainage. 3. 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 Although by consensus criteria, this warrants follow-up with MRI at 1 year, given comorbidities, of questionable clinical significance. 4.  Aortic Atherosclerosis (ICD10-I70.0). Electronically Signed   By: Abigail Miyamoto M.D.   On: 10/10/2019 08:38   DG ERCP BILIARY & PANCREATIC DUCTS  Result Date: 10/15/2019 CLINICAL DATA:  Elevated liver enzymes. EXAM: ERCP TECHNIQUE: Multiple spot images obtained with the fluoroscopic device and submitted for interpretation post-procedure. COMPARISON:  MRCP-10/09/2018 FLUOROSCOPY TIME:  26 minutes, 12 seconds FINDINGS: Single spot fluoroscopic image of the right upper abdominal quadrant is provided for review. An ERCP probe overlies the right upper abdominal quadrant. There is selective cannulation and opacification of CBD. There 4 apparent nonocclusive filling defects within the distal aspect of the CBD which could represent air bubbles. There is an apparent narrowing involving the mid aspect of the CBD with insufflation of a balloon central to this apparent narrowing with subsequent presumed  biliary sweeping. There is no significant opacification of the intrahepatic biliary tree, cystic or pancreatic ducts. IMPRESSION: ERCP as above. These images were submitted for radiologic interpretation only. Please see the procedural report for the amount of contrast and the fluoroscopy time utilized. Electronically Signed   By: Sandi Mariscal M.D.   On: 10/15/2019 16:04   MR ABDOMEN MRCP W WO CONTAST  Result Date: 10/10/2019 CLINICAL DATA:  Follow-up of pancreatic lesions. EXAM: MRI ABDOMEN WITHOUT AND WITH CONTRAST (INCLUDING MRCP) TECHNIQUE: Multiplanar multisequence MR imaging of the abdomen was performed both before and after the administration of intravenous contrast. Heavily T2-weighted images of the biliary and pancreatic ducts were obtained, and three-dimensional MRCP images were rendered by post processing. CONTRAST:  77mL GADAVIST GADOBUTROL 1 MMOL/ML IV SOLN COMPARISON:  06/25/2019 FINDINGS: Portions of exam are mildly motion degraded. Lower chest: Right lower lobe scarring. Normal heart size without pericardial or pleural effusion. Hepatobiliary: Progression of massive lateral segment left liver lobe intrahepatic biliary duct dilatation. Progression of moderate biliary duct dilatation throughout the remainder of the liver. This intrahepatic duct dilatation continues to the level of an amorphous area of heterogeneous delayed hyperenhancement within the central left hepatic lobe which measures 2.8 x 2.5 cm on 38/40. Increased from 2.1 x 1.6 cm on the prior exam (when remeasured). The common duct is normal in caliber, without choledocholithiasis. Pancreas: Complex cystic lesion within the pancreatic neck/body is similar at 3.0 x 2.4 cm on 15/5. No abnormal postcontrast enhancement. Smaller cystic lesions within the body and uncinate process are similar and of doubtful clinical significance. No acute pancreatitis. Spleen:  Normal in size, without focal abnormality. Adrenals/Urinary Tract: Normal adrenal  glands. Normal kidneys, without hydronephrosis. Stomach/Bowel: Normal stomach and abdominal bowel loops. Vascular/Lymphatic: Aortic atherosclerosis. Chronic left portal vein occlusion. Retroaortic left renal vein. No abdominal adenopathy. Other:  No ascites. Musculoskeletal: No acute osseous abnormality. IMPRESSION: 1. Mildly motion degraded exam. 2. Progressive biliary duct dilatation secondary to an amorphous mass within the central left hepatic lobe, highly suspicious for cholangiocarcinoma. Consider biliary drainage. 3. 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 Although by consensus criteria, this warrants follow-up with MRI at 1 year, given comorbidities, of questionable  clinical significance. 4.  Aortic Atherosclerosis (ICD10-I70.0). Electronically Signed   By: Abigail Miyamoto M.D.   On: 10/10/2019 08:38   IR BILIARY DRAIN PLACEMENT WITH CHOLANGIOGRAM  Result Date: 10/18/2019 INDICATION: Pancreatic lesions, jaundice, central left hepatic lobe mass with extensive intrahepatic biliary ductal dilatation. Unsuccessful ERCP drainage. EXAM: PERCUTANEOUS TRANSHEPATIC CHOLANGIOGRAM PERCUTANEOUS  EXTERNAL BILIARY DRAIN CATHETER PLACEMENT MEDICATIONS: As antibiotic prophylaxis, Zosyn 3.75 g was ordered pre-procedure and administered intravenously within one hour of incision. ANESTHESIA/SEDATION: Intravenous Fentanyl 71mcg and Versed 1.5mg  were administered as conscious sedation during continuous monitoring of the patient's level of consciousness and physiological / cardiorespiratory status by the radiology RN, with a total moderate sedation time of 43 minutes. PROCEDURE: Informed written consent was obtained from the patient after a thorough discussion of the procedural risks, benefits and alternatives. All questions were addressed. Maximal Sterile Barrier Technique was utilized including caps, mask, sterile gowns, sterile gloves, sterile drape,  hand hygiene and skin antiseptic. A timeout was performed prior to the initiation of the procedure. Dilated intrahepatic biliary tree was identified on ultrasound and a peripheral right lower lobe duct was selected for approach. Skin and subcutaneous tissues down to the liver capsule infiltrated with 1% lidocaine. Under real-time ultrasound guidance, a 21 gauge micropuncture needle was advanced into the duct. Small contrast injection under fluoroscopy confirmed appropriate positioning. A 018 guidewire advanced centrally. A 5 French micropuncture dilator was placed. Contrast injection for percutaneous transhepatic cholangiogram was performed. Catheter exchanged over an angled Glidewire for a 5 [place] Kumpe catheter, advanced to the biliary confluence for additional cholangiographic images. The Kumpe catheter was exchanged for a 7 [place] vascular sheath which allowed attempts to traverse the CBD, ultimately unsuccessful. The catheter and sheath were removed over the guidewire and a 10 French Dawson Mueller pigtail drain catheter was advanced and formed centrally at the biliary confluence. Catheter was secured externally with 0 Prolene suture and StatLock, placed to external drain bag to maximize biliary decompression. The patient tolerated the procedure well. FLUOROSCOPY TIME:  12 minutes 24 seconds; [id number] mGy COMPLICATIONS: None immediate. FINDINGS: The percutaneous transhepatic cholangiogram confirms marked intrahepatic biliary ductal dilatation. No intraluminal filling defects are evident. There appears to be cut off of multiple central right ducts although the guidewire passes from 1 segment to another. The left biliary tree was not opacified. The CBD was not opacified and various attempts at catheter and guidewire manipulation across the CBD were ultimately unsuccessful. Therefore, a 67 [place] external biliary drainage catheter placed as above. IMPRESSION: 1. Intrahepatic high-grade biliary obstruction at the  confluence. Access across the CBD could not be achieved. 2. Technically successful right external biliary drain catheter placement. Patient will return next week after several days of external biliary decompression, for attempts at internalization of drain, possible brush biopsy, possible stenting. Electronically Signed   By: Lucrezia Europe M.D.   On: 10/18/2019 16:21    Labs:  CBC: Recent Labs    10/18/19 1020  WBC 5.5  HGB 11.5*  HCT 33.4*  PLT 347    COAGS: Recent Labs    10/18/19 1020  INR 1.0    BMP: Recent Labs    10/09/19 2015 10/18/19 1020  NA  --  141  K  --  3.9  CL  --  105  CO2  --  23  GLUCOSE  --  111*  BUN  --  35*  CALCIUM  --  9.6  CREATININE 0.90 1.16*  GFRNONAA  --  45*  GFRAA  --  52*    LIVER FUNCTION TESTS: Recent Labs    10/18/19 1020  BILITOT 23.0*  AST 51*  ALT 15  ALKPHOS 361*  PROT 6.4*  ALBUMIN 3.1*    TUMOR MARKERS: No results for input(s): AFPTM, CEA, CA199, CHROMGRNA in the last 8760 hours.  Assessment and Plan:  Obstructive jaundice Rt external biliary drain placed in IR 3/26 Draining well Scheduled now for drain evaluation and possible biopsy/internalization Risks and benefits of Biliary drain evaluation/possible biopsy and internalization discussed with the patient including, but not limited to bleeding, infection which may lead to sepsis or even death and damage to adjacent structures.  This interventional procedure involves the use of X-rays and because of the nature of the planned procedure, it is possible that we will have prolonged use of X-ray fluoroscopy.  Potential radiation risks to you include (but are not limited to) the following: - A slightly elevated risk for cancer  several years later in life. This risk is typically less than 0.5% percent. This risk is low in comparison to the normal incidence of human cancer, which is 33% for women and 50% for men according to the [place]. - Radiation  induced injury can include skin redness, resembling a rash, tissue breakdown / ulcers and hair loss (which can be temporary or permanent).   The likelihood of either of these occurring depends on the difficulty of the procedure and whether you are sensitive to radiation due to previous procedures, disease, or genetic conditions.   IF your procedure requires a prolonged use of radiation, you will be notified and given written instructions for further action.  It is your responsibility to monitor the irradiated area for the 2 weeks following the procedure and to notify your physician if you are concerned that you have suffered a radiation induced injury.    All of the patient's questions were answered, patient is agreeable to proceed. Consent signed and in chart.  Thank you for this interesting consult.  I greatly enjoyed meeting [facility] and look forward to participating in their care.  A copy of this report was sent to the requesting provider on this date.  Electronically Signed: Lavonia Drafts, PA-C 10/24/2019, 8:58 AM   I spent a total of    25 Minutes in face to face in clinical consultation, greater than 50% of which was counseling/coordinating care for biliary drain evaluation; possible biopsy and internalization

## 2019-10-25 ENCOUNTER — Other Ambulatory Visit (HOSPITAL_COMMUNITY): Payer: Self-pay | Admitting: Gastroenterology

## 2019-10-25 ENCOUNTER — Encounter (HOSPITAL_COMMUNITY): Payer: Self-pay

## 2019-10-25 DIAGNOSIS — R160 Hepatomegaly, not elsewhere classified: Secondary | ICD-10-CM

## 2019-10-25 DIAGNOSIS — R17 Unspecified jaundice: Secondary | ICD-10-CM

## 2019-10-25 DIAGNOSIS — K869 Disease of pancreas, unspecified: Secondary | ICD-10-CM

## 2019-10-30 ENCOUNTER — Other Ambulatory Visit: Payer: Self-pay | Admitting: Gastroenterology

## 2019-10-30 ENCOUNTER — Other Ambulatory Visit (HOSPITAL_COMMUNITY): Payer: Self-pay | Admitting: Interventional Radiology

## 2019-10-30 DIAGNOSIS — R17 Unspecified jaundice: Secondary | ICD-10-CM

## 2019-10-30 DIAGNOSIS — K869 Disease of pancreas, unspecified: Secondary | ICD-10-CM

## 2019-10-30 DIAGNOSIS — R160 Hepatomegaly, not elsewhere classified: Secondary | ICD-10-CM

## 2019-10-31 ENCOUNTER — Telehealth: Payer: Self-pay | Admitting: Student

## 2019-10-31 NOTE — Telephone Encounter (Signed)
Sister called with questions re: drain.   She reports a small amount of leakage on the dressing.  She has had small amounts of leakage before, but noticed more leakage today after patient dressed for the day. Questions whether there is a problem with the tube.   She denies abdominal pain, nausea.  The drains are being flushed once daily without issue or resistance. There is adequate drainage into the collection bag.   Based on the information above, it appears the drain is working properly with a small amount of leakage from around the catheter.  Ok to monitor for now.  Continue current management.  Sister will call if there is a significant change such as inability to flush or lack of output, or development of new symptoms.  Plan to keep appointment next Wednesday for reattempt to traverse obstruction and brush biopsies.   Sister voice understanding.   Brynda Greathouse, MS RD PA-C 10:15 AM

## 2019-11-02 ENCOUNTER — Other Ambulatory Visit (HOSPITAL_COMMUNITY)
Admission: RE | Admit: 2019-11-02 | Discharge: 2019-11-02 | Disposition: A | Discharge status: Home / Self Care | Payer: Medicare Other | Source: Ambulatory Visit | Attending: Gastroenterology | Admitting: Gastroenterology

## 2019-11-02 DIAGNOSIS — Z20822 Contact with and (suspected) exposure to covid-19: Secondary | ICD-10-CM | POA: Insufficient documentation

## 2019-11-02 DIAGNOSIS — Z01812 Encounter for preprocedural laboratory examination: Secondary | ICD-10-CM | POA: Insufficient documentation

## 2019-11-02 LAB — SARS CORONAVIRUS 2 (TAT 6-24 HRS): SARS Coronavirus 2: NEGATIVE

## 2019-11-04 ENCOUNTER — Other Ambulatory Visit (HOSPITAL_COMMUNITY): Payer: Medicare Other

## 2019-11-05 ENCOUNTER — Encounter (HOSPITAL_COMMUNITY): Payer: Self-pay | Admitting: Anesthesiology

## 2019-11-06 ENCOUNTER — Ambulatory Visit (HOSPITAL_COMMUNITY): Admission: RE | Admit: 2019-11-06 | Payer: Medicare Other | Source: Home / Self Care | Admitting: Gastroenterology

## 2019-11-06 ENCOUNTER — Emergency Department (HOSPITAL_COMMUNITY)
Admission: EM | Admit: 2019-11-06 | Discharge: 2019-11-06 | Disposition: A | Discharge status: Home / Self Care | Payer: Medicare Other | Source: Home / Self Care | Attending: Emergency Medicine | Admitting: Emergency Medicine

## 2019-11-06 ENCOUNTER — Encounter (HOSPITAL_COMMUNITY)
Admission: EM | Disposition: A | Discharge status: Home / Self Care | Payer: Self-pay | Source: Home / Self Care | Attending: Emergency Medicine

## 2019-11-06 ENCOUNTER — Other Ambulatory Visit: Payer: Self-pay

## 2019-11-06 ENCOUNTER — Ambulatory Visit (HOSPITAL_COMMUNITY): Payer: Medicare Other

## 2019-11-06 ENCOUNTER — Ambulatory Visit (HOSPITAL_COMMUNITY): Payer: Medicare Other | Admitting: Anesthesiology

## 2019-11-06 ENCOUNTER — Other Ambulatory Visit: Payer: Self-pay | Admitting: Radiology

## 2019-11-06 ENCOUNTER — Ambulatory Visit (HOSPITAL_COMMUNITY)
Admission: RE | Admit: 2019-11-06 | Discharge: 2019-11-06 | Disposition: A | Discharge status: Home / Self Care | Payer: Medicare Other | Source: Other Acute Inpatient Hospital | Attending: Gastroenterology | Admitting: Gastroenterology

## 2019-11-06 ENCOUNTER — Encounter (HOSPITAL_COMMUNITY)
Admission: RE | Disposition: A | Discharge status: Home / Self Care | Payer: Self-pay | Source: Other Acute Inpatient Hospital | Attending: Gastroenterology

## 2019-11-06 ENCOUNTER — Other Ambulatory Visit: Payer: Medicare Other

## 2019-11-06 ENCOUNTER — Encounter (HOSPITAL_COMMUNITY): Payer: Self-pay | Admitting: Gastroenterology

## 2019-11-06 ENCOUNTER — Emergency Department (HOSPITAL_COMMUNITY): Payer: Medicare Other

## 2019-11-06 ENCOUNTER — Ambulatory Visit (HOSPITAL_COMMUNITY): Admission: RE | Admit: 2019-11-06 | Payer: Medicare Other | Source: Ambulatory Visit

## 2019-11-06 ENCOUNTER — Encounter (HOSPITAL_COMMUNITY): Payer: Self-pay | Admitting: Emergency Medicine

## 2019-11-06 DIAGNOSIS — E871 Hypo-osmolality and hyponatremia: Secondary | ICD-10-CM | POA: Diagnosis present

## 2019-11-06 DIAGNOSIS — D649 Anemia, unspecified: Secondary | ICD-10-CM | POA: Diagnosis present

## 2019-11-06 DIAGNOSIS — D72829 Elevated white blood cell count, unspecified: Secondary | ICD-10-CM | POA: Diagnosis present

## 2019-11-06 DIAGNOSIS — Z79899 Other long term (current) drug therapy: Secondary | ICD-10-CM | POA: Insufficient documentation

## 2019-11-06 DIAGNOSIS — G20 Parkinson's disease: Secondary | ICD-10-CM | POA: Insufficient documentation

## 2019-11-06 DIAGNOSIS — T85520A Displacement of bile duct prosthesis, initial encounter: Secondary | ICD-10-CM

## 2019-11-06 DIAGNOSIS — R1031 Right lower quadrant pain: Secondary | ICD-10-CM

## 2019-11-06 DIAGNOSIS — K831 Obstruction of bile duct: Secondary | ICD-10-CM | POA: Insufficient documentation

## 2019-11-06 DIAGNOSIS — Z7982 Long term (current) use of aspirin: Secondary | ICD-10-CM | POA: Insufficient documentation

## 2019-11-06 DIAGNOSIS — M1990 Unspecified osteoarthritis, unspecified site: Secondary | ICD-10-CM | POA: Insufficient documentation

## 2019-11-06 DIAGNOSIS — I10 Essential (primary) hypertension: Secondary | ICD-10-CM | POA: Insufficient documentation

## 2019-11-06 DIAGNOSIS — Z96611 Presence of right artificial shoulder joint: Secondary | ICD-10-CM | POA: Insufficient documentation

## 2019-11-06 DIAGNOSIS — Z87891 Personal history of nicotine dependence: Secondary | ICD-10-CM | POA: Insufficient documentation

## 2019-11-06 DIAGNOSIS — Z800 Family history of malignant neoplasm of digestive organs: Secondary | ICD-10-CM | POA: Insufficient documentation

## 2019-11-06 DIAGNOSIS — N179 Acute kidney failure, unspecified: Secondary | ICD-10-CM | POA: Diagnosis present

## 2019-11-06 DIAGNOSIS — Z96651 Presence of right artificial knee joint: Secondary | ICD-10-CM | POA: Insufficient documentation

## 2019-11-06 DIAGNOSIS — G20A1 Parkinson's disease without dyskinesia, without mention of fluctuations: Secondary | ICD-10-CM | POA: Diagnosis present

## 2019-11-06 HISTORY — PX: BILIARY STENT PLACEMENT: SHX5538

## 2019-11-06 HISTORY — PX: ERCP: SHX5425

## 2019-11-06 HISTORY — PX: BILIARY DILATION: SHX6850

## 2019-11-06 LAB — URINALYSIS, ROUTINE W REFLEX MICROSCOPIC
Glucose, UA: NEGATIVE mg/dL
Hgb urine dipstick: NEGATIVE
Ketones, ur: NEGATIVE mg/dL
Leukocytes,Ua: NEGATIVE
Nitrite: NEGATIVE
Protein, ur: 30 mg/dL — AB
Specific Gravity, Urine: 1.02 (ref 1.005–1.030)
pH: 5 (ref 5.0–8.0)

## 2019-11-06 LAB — COMPREHENSIVE METABOLIC PANEL
ALT: 19 U/L (ref 0–44)
AST: 67 U/L — ABNORMAL HIGH (ref 15–41)
Albumin: 2.7 g/dL — ABNORMAL LOW (ref 3.5–5.0)
Alkaline Phosphatase: 235 U/L — ABNORMAL HIGH (ref 38–126)
Anion gap: 13 (ref 5–15)
BUN: 39 mg/dL — ABNORMAL HIGH (ref 8–23)
CO2: 18 mmol/L — ABNORMAL LOW (ref 22–32)
Calcium: 9.2 mg/dL (ref 8.9–10.3)
Chloride: 105 mmol/L (ref 98–111)
Creatinine, Ser: 1.45 mg/dL — ABNORMAL HIGH (ref 0.44–1.00)
GFR calc Af Amer: 40 mL/min — ABNORMAL LOW (ref >60)
GFR calc non Af Amer: 34 mL/min — ABNORMAL LOW (ref >60)
Glucose, Bld: 145 mg/dL — ABNORMAL HIGH (ref 70–99)
Potassium: 4.1 mmol/L (ref 3.5–5.1)
Sodium: 136 mmol/L (ref 135–145)
Total Bilirubin: 12.3 mg/dL — ABNORMAL HIGH (ref 0.3–1.2)
Total Protein: 6.3 g/dL — ABNORMAL LOW (ref 6.5–8.1)

## 2019-11-06 LAB — CBC
HCT: 36.3 % (ref 36.0–46.0)
Hemoglobin: 12 g/dL (ref 12.0–15.0)
MCH: 32.9 pg (ref 26.0–34.0)
MCHC: 33.1 g/dL (ref 30.0–36.0)
MCV: 99.5 fL (ref 80.0–100.0)
Platelets: 421 10*3/uL — ABNORMAL HIGH (ref 150–400)
RBC: 3.65 MIL/uL — ABNORMAL LOW (ref 3.87–5.11)
RDW: 17.4 % — ABNORMAL HIGH (ref 11.5–15.5)
WBC: 10.9 10*3/uL — ABNORMAL HIGH (ref 4.0–10.5)
nRBC: 0 % (ref 0.0–0.2)

## 2019-11-06 LAB — LIPASE, BLOOD: Lipase: 30 U/L (ref 11–51)

## 2019-11-06 SURGERY — CANCELLED PROCEDURE

## 2019-11-06 SURGERY — ERCP, WITH INTERVENTION IF INDICATED
Anesthesia: General

## 2019-11-06 MED ORDER — IOHEXOL 9 MG/ML PO SOLN
ORAL | Status: AC
  Administered 2019-11-06: 500 mL via ORAL
  Filled 2019-11-06: qty 1000

## 2019-11-06 MED ORDER — OXYCODONE HCL 5 MG PO CAPS: 5.0000 mg | ORAL_CAPSULE | Freq: Three times a day (TID) | ORAL | 0 refills | Status: DC | PRN

## 2019-11-06 MED ORDER — CIPROFLOXACIN IN D5W 400 MG/200ML IV SOLN
INTRAVENOUS | Status: DC | PRN
  Administered 2019-11-06: 400 mg via INTRAVENOUS

## 2019-11-06 MED ORDER — IOHEXOL 9 MG/ML PO SOLN
500.0000 mL | ORAL | Status: AC
  Administered 2019-11-06: 06:00:00 500 mL via ORAL

## 2019-11-06 MED ORDER — SODIUM CHLORIDE 0.9% FLUSH
3.0000 mL | Freq: Once | INTRAVENOUS | Status: AC
  Administered 2019-11-06: 3 mL via INTRAVENOUS

## 2019-11-06 MED ORDER — GLUCAGON HCL RDNA (DIAGNOSTIC) 1 MG IJ SOLR
INTRAMUSCULAR | Status: AC
  Filled 2019-11-06: qty 1

## 2019-11-06 MED ORDER — SODIUM CHLORIDE 0.9 % IV SOLN: INTRAVENOUS | Status: DC

## 2019-11-06 MED ORDER — SODIUM CHLORIDE 0.9 % IV SOLN
INTRAVENOUS | Status: DC | PRN
  Administered 2019-11-06: 125 mL

## 2019-11-06 MED ORDER — DEXAMETHASONE SODIUM PHOSPHATE 10 MG/ML IJ SOLN
INTRAMUSCULAR | Status: DC | PRN
  Administered 2019-11-06: 8 mg via INTRAVENOUS

## 2019-11-06 MED ORDER — PROPOFOL 10 MG/ML IV BOLUS
INTRAVENOUS | Status: AC
  Filled 2019-11-06: qty 20

## 2019-11-06 MED ORDER — EPHEDRINE SULFATE 50 MG/ML IJ SOLN
INTRAMUSCULAR | Status: DC | PRN
  Administered 2019-11-06 (×2): 10 mg via INTRAVENOUS
  Administered 2019-11-06: 5 mg via INTRAVENOUS

## 2019-11-06 MED ORDER — LACTATED RINGERS IV SOLN
INTRAVENOUS | Status: DC
  Administered 2019-11-06: 1000 mL via INTRAVENOUS

## 2019-11-06 MED ORDER — INDOMETHACIN 50 MG RE SUPP
RECTAL | Status: AC
  Filled 2019-11-06: qty 2

## 2019-11-06 MED ORDER — FENTANYL CITRATE (PF) 100 MCG/2ML IJ SOLN
INTRAMUSCULAR | Status: AC
  Filled 2019-11-06: qty 2

## 2019-11-06 MED ORDER — MORPHINE SULFATE (PF) 4 MG/ML IV SOLN
4.0000 mg | Freq: Once | INTRAVENOUS | Status: AC
  Administered 2019-11-06: 4 mg via INTRAVENOUS
  Filled 2019-11-06: qty 1

## 2019-11-06 MED ORDER — CIPROFLOXACIN IN D5W 400 MG/200ML IV SOLN
INTRAVENOUS | Status: AC
  Filled 2019-11-06: qty 200

## 2019-11-06 MED ORDER — ONDANSETRON HCL 4 MG/2ML IJ SOLN
INTRAMUSCULAR | Status: DC | PRN
  Administered 2019-11-06: 4 mg via INTRAVENOUS

## 2019-11-06 MED ORDER — ONDANSETRON HCL 4 MG/2ML IJ SOLN
4.0000 mg | Freq: Once | INTRAMUSCULAR | Status: AC
  Administered 2019-11-06: 4 mg via INTRAVENOUS
  Filled 2019-11-06: qty 2

## 2019-11-06 MED ORDER — FENTANYL CITRATE (PF) 100 MCG/2ML IJ SOLN
INTRAMUSCULAR | Status: DC | PRN
  Administered 2019-11-06 (×4): 12.5 ug via INTRAVENOUS
  Administered 2019-11-06: 50 ug via INTRAVENOUS

## 2019-11-06 MED ORDER — PHENYLEPHRINE HCL (PRESSORS) 10 MG/ML IV SOLN
INTRAVENOUS | Status: DC | PRN
  Administered 2019-11-06 (×2): 80 ug via INTRAVENOUS
  Administered 2019-11-06: 120 ug via INTRAVENOUS
  Administered 2019-11-06: 80 ug via INTRAVENOUS

## 2019-11-06 MED ORDER — MIDAZOLAM HCL 2 MG/2ML IJ SOLN
INTRAMUSCULAR | Status: AC
  Filled 2019-11-06: qty 2

## 2019-11-06 MED ORDER — PROPOFOL 10 MG/ML IV BOLUS
INTRAVENOUS | Status: DC | PRN
  Administered 2019-11-06: 100 mg via INTRAVENOUS

## 2019-11-06 MED ORDER — SODIUM CHLORIDE 0.9 % IV BOLUS (SEPSIS)
500.0000 mL | Freq: Once | INTRAVENOUS | Status: AC
  Administered 2019-11-06: 500 mL via INTRAVENOUS

## 2019-11-06 MED ORDER — FENTANYL CITRATE (PF) 100 MCG/2ML IJ SOLN
50.0000 ug | Freq: Once | INTRAMUSCULAR | Status: AC
  Administered 2019-11-06: 50 ug via INTRAVENOUS
  Filled 2019-11-06: qty 2

## 2019-11-06 MED ORDER — ROCURONIUM BROMIDE 100 MG/10ML IV SOLN
INTRAVENOUS | Status: DC | PRN
  Administered 2019-11-06: 40 mg via INTRAVENOUS

## 2019-11-06 MED ORDER — SUGAMMADEX SODIUM 200 MG/2ML IV SOLN
INTRAVENOUS | Status: DC | PRN
  Administered 2019-11-06: 150 mg via INTRAVENOUS

## 2019-11-06 MED ORDER — IOHEXOL 300 MG/ML  SOLN
100.0000 mL | Freq: Once | INTRAMUSCULAR | Status: AC | PRN
  Administered 2019-11-06: 75 mL via INTRAVENOUS

## 2019-11-06 MED ORDER — LIDOCAINE HCL (CARDIAC) PF 100 MG/5ML IV SOSY
PREFILLED_SYRINGE | INTRAVENOUS | Status: DC | PRN
  Administered 2019-11-06: 40 mg via INTRAVENOUS

## 2019-11-06 NOTE — ED Provider Notes (Signed)
Assumed care at change of shift with imaging pending.  CT shows that the right biliary drain has retracted into the abdominal wall.  No acute abnormalities otherwise.  Labs similar to recent ones. Per her sister-in-law, both biliary drains have seemingly been working appropriately.  Patient is scheduled for an ERCP at 9:30 AM today.  Her sister-in-law reports that she is also scheduled with interventional radiology tomorrow for "to check her tubes."  I not think that today's CT findings would necessarily preclude ERCP, but will discuss with GI.  Anticipate discharge so she can keep his appointment unless GI says otherwise.  8:21 AM Briefly discussed with Dr Penelope Coop, GI. Fine to DC for ERCP today and IR tomorrow.   CT ABDOMEN PELVIS W CONTRAST  Result Date: 11/06/2019 CLINICAL DATA:  Right lower quadrant pain and jaundice. EXAM: CT ABDOMEN AND PELVIS WITH CONTRAST TECHNIQUE: Multidetector CT imaging of the abdomen and pelvis was performed using the standard protocol following bolus administration of intravenous contrast. CONTRAST:  56mL OMNIPAQUE IOHEXOL 300 MG/ML  SOLN COMPARISON:  10/09/2019 liver MRI FINDINGS: Lower chest:  Mild atelectasis or scarring in the lower lungs. Hepatobiliary: Amorphouscentral hepatic mass poorly defined on this study, roughly 2.9 cm on coronal reformats. There is intrahepatic bile duct obstruction with left and right biliary drains. The right-sided drain has been retracted into the abdominal wall. The left-sided drain is in position over the liver, although there is still prominent left-sided ductal dilatation at the level of segment to. 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 Spleen: Unremarkable. Adrenals/Urinary Tract: Negative adrenals. No hydronephrosis or stone. Unremarkable bladder. Stomach/Bowel: No obstruction. No appendicitis. Numerous left colonic diverticula.  Vascular/Lymphatic: No acute vascular abnormality. Atherosclerotic calcification of the aorta. No mass or adenopathy. Reproductive:No pathologic findings. Other: No ascites or pneumoperitoneum. Shallow bilateral groin hernia with fluid density on the right. Musculoskeletal: No acute abnormalities. L5-S1 advanced facet osteoarthritis with anterolisthesis and accelerated disc narrowing. IMPRESSION: Hepatic hilar mass with biliary obstruction and percutaneous drainage catheters. The right-sided drain has been retracted into the abdominal wall. The left-sided drain is in place although there is still prominent dilation of segment 2 ducts. Electronically Signed   By: Monte Fantasia M.D.   On: 11/06/2019 07:31   MR 3D Recon At Scanner  Result Date: 10/10/2019 CLINICAL DATA:  Follow-up of pancreatic lesions. EXAM: MRI ABDOMEN WITHOUT AND WITH CONTRAST (INCLUDING MRCP) TECHNIQUE: Multiplanar multisequence MR imaging of the abdomen was performed both before and after the administration of intravenous contrast. Heavily T2-weighted images of the biliary and pancreatic ducts were obtained, and three-dimensional MRCP images were rendered by post processing. CONTRAST:  35mL GADAVIST GADOBUTROL 1 MMOL/ML IV SOLN COMPARISON:  06/25/2019 FINDINGS: Portions of exam are mildly motion degraded. Lower chest: Right lower lobe scarring. Normal heart size without pericardial or pleural effusion. Hepatobiliary: Progression of massive lateral segment left liver lobe intrahepatic biliary duct dilatation. Progression of moderate biliary duct dilatation throughout the remainder of the liver. This intrahepatic duct dilatation continues to the level of an amorphous area of heterogeneous delayed hyperenhancement within the central left hepatic lobe which measures 2.8 x 2.5 cm on 38/40. Increased from 2.1 x 1.6 cm on the prior exam (when remeasured). The common duct is normal in caliber, without choledocholithiasis. Pancreas: Complex cystic  lesion within the pancreatic neck/body is similar at 3.0 x 2.4 cm on 15/5. No abnormal postcontrast enhancement. Smaller cystic lesions within the body and  uncinate process are similar and of doubtful clinical significance. No acute pancreatitis. Spleen:  Normal in size, without focal abnormality. Adrenals/Urinary Tract: Normal adrenal glands. Normal kidneys, without hydronephrosis. Stomach/Bowel: Normal stomach and abdominal bowel loops. Vascular/Lymphatic: Aortic atherosclerosis. Chronic left portal vein occlusion. Retroaortic left renal vein. No abdominal adenopathy. Other:  No ascites. Musculoskeletal: No acute osseous abnormality. IMPRESSION: 1. Mildly motion degraded exam. 2. Progressive biliary duct dilatation secondary to an amorphous mass within the central left hepatic lobe, highly suspicious for cholangiocarcinoma. Consider biliary drainage. 3. 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 Although by consensus criteria, this warrants follow-up with MRI at 1 year, given comorbidities, of questionable clinical significance. 4.  Aortic Atherosclerosis (ICD10-I70.0). Electronically Signed   By: Abigail Miyamoto M.D.   On: 10/10/2019 08:38   DG ERCP BILIARY & PANCREATIC DUCTS  Result Date: 10/15/2019 CLINICAL DATA:  Elevated liver enzymes. EXAM: ERCP TECHNIQUE: Multiple spot images obtained with the fluoroscopic device and submitted for interpretation post-procedure. COMPARISON:  MRCP-10/09/2018 FLUOROSCOPY TIME:  26 minutes, 12 seconds FINDINGS: Single spot fluoroscopic image of the right upper abdominal quadrant is provided for review. An ERCP probe overlies the right upper abdominal quadrant. There is selective cannulation and opacification of CBD. There 4 apparent nonocclusive filling defects within the distal aspect of the CBD which could represent air bubbles. There is an apparent narrowing involving the mid aspect of the CBD with  insufflation of a balloon central to this apparent narrowing with subsequent presumed biliary sweeping. There is no significant opacification of the intrahepatic biliary tree, cystic or pancreatic ducts. IMPRESSION: ERCP as above. These images were submitted for radiologic interpretation only. Please see the procedural report for the amount of contrast and the fluoroscopy time utilized. Electronically Signed   By: Sandi Mariscal M.D.   On: 10/15/2019 16:04   MR ABDOMEN MRCP W WO CONTAST  Result Date: 10/10/2019 CLINICAL DATA:  Follow-up of pancreatic lesions. EXAM: MRI ABDOMEN WITHOUT AND WITH CONTRAST (INCLUDING MRCP) TECHNIQUE: Multiplanar multisequence MR imaging of the abdomen was performed both before and after the administration of intravenous contrast. Heavily T2-weighted images of the biliary and pancreatic ducts were obtained, and three-dimensional MRCP images were rendered by post processing. CONTRAST:  46mL GADAVIST GADOBUTROL 1 MMOL/ML IV SOLN COMPARISON:  06/25/2019 FINDINGS: Portions of exam are mildly motion degraded. Lower chest: Right lower lobe scarring. Normal heart size without pericardial or pleural effusion. Hepatobiliary: Progression of massive lateral segment left liver lobe intrahepatic biliary duct dilatation. Progression of moderate biliary duct dilatation throughout the remainder of the liver. This intrahepatic duct dilatation continues to the level of an amorphous area of heterogeneous delayed hyperenhancement within the central left hepatic lobe which measures 2.8 x 2.5 cm on 38/40. Increased from 2.1 x 1.6 cm on the prior exam (when remeasured). The common duct is normal in caliber, without choledocholithiasis. Pancreas: Complex cystic lesion within the pancreatic neck/body is similar at 3.0 x 2.4 cm on 15/5. No abnormal postcontrast enhancement. Smaller cystic lesions within the body and uncinate process are similar and of doubtful clinical significance. No acute pancreatitis. Spleen:   Normal in size, without focal abnormality. Adrenals/Urinary Tract: Normal adrenal glands. Normal kidneys, without hydronephrosis. Stomach/Bowel: Normal stomach and abdominal bowel loops. Vascular/Lymphatic: Aortic atherosclerosis. Chronic left portal vein occlusion. Retroaortic left renal vein. No abdominal adenopathy. Other:  No ascites. Musculoskeletal: No acute osseous abnormality. IMPRESSION: 1. Mildly motion degraded exam. 2. Progressive biliary duct dilatation secondary to an  amorphous mass within the central left hepatic lobe, highly suspicious for cholangiocarcinoma. Consider biliary drainage. 3. 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 Although by consensus criteria, this warrants follow-up with MRI at 1 year, given comorbidities, of questionable clinical significance. 4.  Aortic Atherosclerosis (ICD10-I70.0). Electronically Signed   By: Abigail Miyamoto M.D.   On: 10/10/2019 08:38   IR BILIARY DRAIN PLACEMENT WITH CHOLANGIOGRAM  Result Date: 10/24/2019 INDICATION: Jaundice, high biliary confluence obstruction EXAM: Exchange of the right external only biliary drain Ultrasound and fluoroscopic insertion of a left external only biliary drain Unsuccessful attempt at crossing the high biliary obstruction from either side. MEDICATIONS: 3.375 g Zosyn; The antibiotic was administered within an appropriate time frame prior to the initiation of the procedure. ANESTHESIA/SEDATION: Moderate (conscious) sedation was employed during this procedure. A total of Versed 1.5 mg and Fentanyl 75 mcg was administered intravenously. Moderate Sedation Time: 39 minutes. The patient's level of consciousness and vital signs were monitored continuously by radiology nursing throughout the procedure under my direct supervision. FLUOROSCOPY TIME:  Fluoroscopy Time: 13 minutes 36 seconds (75 mGy). COMPLICATIONS: None immediate. PROCEDURE: Informed written consent  was obtained from the patient after a thorough discussion of the procedural risks, benefits and alternatives. All questions were addressed. Maximal Sterile Barrier Technique was utilized including caps, mask, sterile gowns, sterile gloves, sterile drape, hand hygiene and skin antiseptic. A timeout was performed prior to the initiation of the procedure. Right biliary drain procedure: Under sterile conditions and local anesthesia, the external only right biliary drain was injected with contrast confirming position at the confluence. Obstruction remains. Catheter removed over an Amplatz guidewire. Eight French sheath inserted. Attempts were made to cross the central biliary obstruction with catheters and guidewires. This is unsuccessful. Over the Amplatz guidewire, a new Bettey Mare 10 [place] biliary drain was advanced with the retention loop formed in the dilated central right ducts. Contrast injection confirms position. Images obtained for documentation. Left biliary drain procedure: Ultrasound performed of the left hepatic lobe through a subxiphoid window. This was correlated with the MRI scan. There is marked dilatation of the left ducts which are undrained by the right side only external drain. Under ultrasound guidance, a dilated left hepatic duct was accessed peripherally from a subxiphoid approach. Images obtained for documentation. There was return of clear bile. Micro dilator inserted followed by the Accustick dilator set. Amplatz guidewire inserted followed by a Kumpe catheter. Catheter guidewire manipulation performed to attempt crossing the central biliary high obstruction from the left duct access. This also was unsuccessful. Over the Amplatz guidewire, a 10 French Bettey Mare catheter was advanced with the retention loop formed in the dilated left biliary tree. Images obtained for documentation. Both catheters secured with Prolene sutures and external gravity drainage bag. No immediate  complication. Patient tolerated the procedure well. IMPRESSION: Fluoroscopic exchange of the right external only biliary drain. Successful ultrasound and fluoroscopic insertion of a left external only biliary drain Unsuccessful attempt at crossing the high biliary obstruction from either side. Electronically Signed   By: Jerilynn Mages.  Shick M.D.   On: 10/24/2019 13:23   IR BILIARY DRAIN PLACEMENT WITH CHOLANGIOGRAM  Result Date: 10/18/2019 INDICATION: Pancreatic lesions, jaundice, central left hepatic lobe mass with extensive intrahepatic biliary ductal dilatation. Unsuccessful ERCP drainage. EXAM: PERCUTANEOUS TRANSHEPATIC CHOLANGIOGRAM PERCUTANEOUS  EXTERNAL BILIARY DRAIN CATHETER PLACEMENT MEDICATIONS: As antibiotic prophylaxis, Zosyn 3.75 g was ordered pre-procedure and administered intravenously within one hour of incision. ANESTHESIA/SEDATION: Intravenous  Fentanyl 34mcg and Versed 1.5mg  were administered as conscious sedation during continuous monitoring of the patient's level of consciousness and physiological / cardiorespiratory status by the radiology RN, with a total moderate sedation time of 43 minutes. PROCEDURE: Informed written consent was obtained from the patient after a thorough discussion of the procedural risks, benefits and alternatives. All questions were addressed. Maximal Sterile Barrier Technique was utilized including caps, mask, sterile gowns, sterile gloves, sterile drape, hand hygiene and skin antiseptic. A timeout was performed prior to the initiation of the procedure. Dilated intrahepatic biliary tree was identified on ultrasound and a peripheral right lower lobe duct was selected for approach. Skin and subcutaneous tissues down to the liver capsule infiltrated with 1% lidocaine. Under real-time ultrasound guidance, a 21 gauge micropuncture needle was advanced into the duct. Small contrast injection under fluoroscopy confirmed appropriate positioning. A 018 guidewire advanced centrally. A 5  French micropuncture dilator was placed. Contrast injection for percutaneous transhepatic cholangiogram was performed. Catheter exchanged over an angled Glidewire for a 5 [place] Kumpe catheter, advanced to the biliary confluence for additional cholangiographic images. The Kumpe catheter was exchanged for a 7 [place] vascular sheath which allowed attempts to traverse the CBD, ultimately unsuccessful. The catheter and sheath were removed over the guidewire and a 10 French Dawson Mueller pigtail drain catheter was advanced and formed centrally at the biliary confluence. Catheter was secured externally with 0 Prolene suture and StatLock, placed to external drain bag to maximize biliary decompression. The patient tolerated the procedure well. FLUOROSCOPY TIME:  12 minutes 24 seconds; 123XX123 mGy COMPLICATIONS: None immediate. FINDINGS: The percutaneous transhepatic cholangiogram confirms marked intrahepatic biliary ductal dilatation. No intraluminal filling defects are evident. There appears to be cut off of multiple central right ducts although the guidewire passes from 1 segment to another. The left biliary tree was not opacified. The CBD was not opacified and various attempts at catheter and guidewire manipulation across the CBD were ultimately unsuccessful. Therefore, a 76 [place] external biliary drainage catheter placed as above. IMPRESSION: 1. 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 Access across the CBD could not be achieved. 2. Technically successful right external biliary drain catheter placement. Patient will return next week after several days of external biliary decompression, for attempts at internalization of drain, possible brush biopsy, possible stenting. Electronically Signed   By: Lucrezia Europe M.D.   On: 10/18/2019 16:21      Virgel Manifold, MD 11/06/19 [phone]

## 2019-11-06 NOTE — ED Triage Notes (Addendum)
Patient reports RLQ abdominal pain with nausea onset this week , no emesis or diarrhea , denies fever or chills . Patient has an abdominal biliary drain tube .

## 2019-11-06 NOTE — Discharge Instructions (Signed)
YOU HAD AN ENDOSCOPIC PROCEDURE TODAY: Refer to the procedure report and other information in the discharge instructions given to you for any specific questions about what was found during the examination. If this information does not answer your questions, please call [facility] office at [phone] to clarify.   YOU SHOULD EXPECT: Some feelings of bloating in the abdomen. Passage of more gas than usual. Walking can help get rid of the air that was put into your GI tract during the procedure and reduce the bloating. If you had a lower endoscopy (such as a colonoscopy or flexible sigmoidoscopy) you may notice spotting of blood in your stool or on the toilet paper. Some abdominal soreness may be present for a day or two, also.  DIET: Your first meal following the procedure should be a light meal and then it is ok to progress to your normal diet. A half-sandwich or bowl of soup is an example of a good first meal. Heavy or fried foods are harder to digest and may make you feel nauseous or bloated. Drink plenty of fluids but you should avoid alcoholic beverages for 24 hours. If you had a esophageal dilation, please see attached instructions for diet.    ACTIVITY: Your care partner should take you home directly after the procedure. You should plan to take it easy, moving slowly for the rest of the day. You can resume normal activity the day after the procedure however YOU SHOULD NOT DRIVE, use power tools, machinery or perform tasks that involve climbing or major physical exertion for 24 hours (because of the sedation medicines used during the test).   SYMPTOMS TO REPORT IMMEDIATELY: A gastroenterologist can be reached at any hour. Please call [phone]  for any of the following symptoms:    Following upper endoscopy (EGD, EUS, ERCP, esophageal dilation) Vomiting of blood or coffee ground material  New, significant abdominal pain  New, significant chest pain or pain under the shoulder blades  Painful  or persistently difficult swallowing  New shortness of breath  Black, tarry-looking or red, bloody stools  FOLLOW UP:  If any biopsies were taken you will be contacted by phone or by letter within the next 1-3 weeks. Call [phone]  if you have not heard about the biopsies in 3 weeks.  Please also call with any specific questions about appointments or follow up tests. Call if question or problem otherwise follow-up with interventional radiology tomorrow and have clear liquids only tonight and will decide how to continue your care based on what they are able to do tomorrow

## 2019-11-06 NOTE — Anesthesia Procedure Notes (Signed)
Procedure Name: Intubation Date/Time: 11/06/2019 12:27 PM Performed by: Glory Buff, CRNA Pre-anesthesia Checklist: Patient identified, Emergency Drugs available, Suction available and Patient being monitored Patient Re-evaluated:Patient Re-evaluated prior to induction Oxygen Delivery Method: Circle system utilized Preoxygenation: Pre-oxygenation with 100% oxygen Induction Type: IV induction Ventilation: Mask ventilation without difficulty Laryngoscope Size: Miller and 3 Grade View: Grade I Tube type: Oral Tube size: 7.0 mm Number of attempts: 1 Airway Equipment and Method: Stylet and Oral airway Placement Confirmation: ETT inserted through vocal cords under direct vision,  positive ETCO2 and breath sounds checked- equal and bilateral Secured at: 20 cm Tube secured with: Tape Dental Injury: Teeth and Oropharynx as per pre-operative assessment

## 2019-11-06 NOTE — Anesthesia Postprocedure Evaluation (Signed)
Anesthesia Post Note  Patient: Hailey [name]  Procedure(s) Performed: ENDOSCOPIC RETROGRADE CHOLANGIOPANCREATOGRAPHY (ERCP) (N/A ) BILIARY STENT PLACEMENT (N/A ) BILIARY DILATION     Patient location during evaluation: PACU Anesthesia Type: General Level of consciousness: awake and alert and oriented Pain management: pain level controlled Vital Signs Assessment: post-procedure vital signs reviewed and stable Respiratory status: spontaneous breathing, nonlabored ventilation and respiratory function stable Cardiovascular status: blood pressure returned to baseline Postop Assessment: no apparent nausea or vomiting Anesthetic complications: no    Last Vitals:  Vitals:   11/06/19 1640 11/06/19 1653  BP: (!) 117/56 (!) 124/48  Pulse: 92 100  Resp: (!) 23 (!) 25  Temp:  37 C  SpO2: 94% 94%    Last Pain:  Vitals:   11/06/19 1653  TempSrc: Oral  PainSc: 0-No pain                 Brennan Bailey

## 2019-11-06 NOTE — ED Notes (Signed)
Pt to CT

## 2019-11-06 NOTE — Op Note (Signed)
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 Estimated Blood Loss:     Estimated blood loss: none. Procedure:                Pre-Anesthesia Assessment:                           - Prior to the procedure, a History and Physical                            was performed, and patient medications and                            allergies were reviewed. The patient's tolerance of                            previous anesthesia was also reviewed. The risks                            and benefits of the procedure and the sedation                            options and risks were discussed with the patient.                            All questions were answered, and informed consent                            was obtained. Prior Anticoagulants: The patient has                            taken no previous anticoagulant or antiplatelet                            agents except for aspirin. ASA Grade Assessment:                            III - A patient with severe systemic disease.  After                            reviewing the risks and benefits, the patient was                            deemed in satisfactory condition to undergo the                            procedure.                           After obtaining informed consent, the scope was                            passed under direct vision. Throughout the                            procedure, the patient's  blood pressure, pulse, and                            oxygen saturations were monitored continuously. The                            [id number]) Olympus was introduced through                            the mouth, and used to inject contrast into and                            used to cannulate the bile duct. The ERCP was                            technically difficult and complex due to extrinsic                            compression. Successful completion of the procedure                            was aided by performing the maneuvers documented                            (below) in this report. The patient tolerated the                            procedure well. Scope In: Scope Out: Findings:      The major papilla was normal. Deep selective cannulation was readily       obtained with the sphincterotome and unfortunately we could not advance       the wire into the intrahepatics and we switched to the adjustable       balloon and the biliary tree was swept with a 12- 15 mm adjustable       balloon starting at the upper third of the main bile duct. Sludge was       swept  from the duct. We continued to try to advance the wire through the       inflated balloon at various different locations in the main CBD but we       were able to switch to a smaller more flexible wire and we were able to       advance into the intrahepatics we thought unfortunately we could not       advance the catheter to follow the wire nor could we at this time get       died to fill the intrahepatics and we went ahead and advanced the       dilation balloon starting with a 4 mm balloon by 2 cm and were able to       advance it partially further after a few dilations and we could inject       dye at this point did fill the intrahepatics unfortunately we could not       advance either the 8 [place] or 10 [place] uncovered metal stent past the       obstruction so we proceeded with dilation of the common  bile duct with a       4 mm balloon dilator by 4 cm which was successful. Unfortunately we       still could not advance the balloon past the stricture and we again were       unsuccessful at retrying the 10 [place] stent so we replaced our balloon       catheter with the balloon inflated and remove the smaller more flexible       wire and reinserted a long wire and retried first the 10 French stent       which was unsuccessful but finally after a prolonged effort one 8 Fr by       6 cm intraductal uncovered metal stent was placed 6 cm into the common       hepatic duct. Bile flowed through the stent. The stent was in good       position. The patient tolerated the procedure well there was no obvious       immediate complication and there was no pancreatic duct injection or       wire advancement throughout the procedure Impression:               - The major papilla appeared normal.                           - The biliary tree was swept and sludge was found.                           - Common bile duct was successfully dilated.                           - One uncovered metal stent was placed into the                            common hepatic duct difficult to say which branch                            based on her procedure being done on her left side  to protect her existing drains. Moderate Sedation:      Not Applicable - Patient had care per Anesthesia. Recommendation:           - Clear liquid diet today.                           - Continue present medications.                           - Return to GI clinic PRN.                           - Telephone GI clinic if symptomatic PRN.                           - Refer to an interventional radiologist tomorrow I                            discussed the case with them today and she will                            need her 1 drain removed and an attempt at possibly                            internalizing the other  side and a tissue diagnosis                            if possible. Procedure Code(s):        --- Professional ---                           [phone], Endoscopic retrograde                            cholangiopancreatography (ERCP); with placement of                            endoscopic stent into biliary or pancreatic duct,                            including pre- and post-dilation and guide wire                            passage, when performed, including sphincterotomy,                            when performed, each stent                           [id number], Endoscopic retrograde                            cholangiopancreatography (ERCP); with removal of                            calculi/debris from biliary/pancreatic duct(s) Diagnosis  Code(s):        --- Professional ---                           R17, Unspecified jaundice                           D49.0, Neoplasm of unspecified behavior of                            digestive system CPT copyright 2019 [employer] Association. All rights reserved. The codes documented in this report are preliminary and upon coder review may  be revised to meet current compliance requirements. Clarene Essex, MD 11/06/2019 4:52:21 PM This report has been signed electronically. Number of Addenda: 0

## 2019-11-06 NOTE — ED Notes (Signed)
Pt taken to CT.

## 2019-11-06 NOTE — Progress Notes (Signed)
Hailey [name] 12:15 PM  Subjective: Patient is case discussed with her sister-in-law and she had been fine since she was seen recently in the office until last night when she had acute onset of pain and her ER evaluation was reviewed and the right drain has seemed to be misplaced but she has no new issues and again we discussed her difficult situation and her case was discussed Monday with interventional radiology  Objective: Vital signs stable afebrile no acute distress exam please see preassessment evaluation labs and CT reviewed  Assessment: Hilar mass with obstruction  Plan: Will retry ERCP with anesthesia assistance per patient request and either IR later today or tomorrow depending on how things go and their availability  [facility]  office [phone] After 5PM or if no answer call [phone]

## 2019-11-06 NOTE — ED Provider Notes (Signed)
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  No fevers, nausea, vomiting or diarrhea.  Sister-in-law reports that she had not been experiencing pain but has had jaundice recently.   Much of the history is obtained from patient's chart as patient is a very poor historian.  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  She appeared to have intrahepatic biliary ductal dilatation with left portal vein thrombosis and possible small obstructing mass in the central left lobe on MRI 06/25/2019.  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  She had stable cystic lesions in the pancreatic body.  Underwent ERCP by [facility] on 08/27/19.  Patient underwent right external biliary drain placed by IR on 10/18/2019 and then a new left external biliary drain placed by IR on 10/24/2019.  Sister-in-law reports they are scheduled for an ERCP this morning at 9:30 AM at [facility] for tissue sampling and stenting.   Her sister-in-law reports that she has not had any significant pain throughout this process.  This is the first time she has complained of pain.  She does not have pain medication at home.  Sister-in-law reports that her drains appear to be draining appropriately.  There is no drainage around the skin or bleeding.   ROS: See HPI Constitutional: no fever  Eyes: no drainage  ENT: no runny nose   Cardiovascular:  no chest pain  Resp: no SOB  GI: no vomiting GU: no dysuria Integumentary: no rash  Allergy: no hives  Musculoskeletal: no leg swelling  Neurological: no slurred speech ROS otherwise negative  PAST MEDICAL HISTORY/PAST SURGICAL HISTORY:  Past Medical History:  Diagnosis Date  . Arthritis    in shoulder  , hands   . Bilateral leg edema    takes lasix as needed for dependent edema   . H/O exercise stress test    4-5 yrs. ago, told that it was wnl , done at [place] office   . Headache    MH: Migraines  . Hypertension   . Osteoporosis   . Parkinson disease ([place])   . Wears glasses     MEDICATIONS:  Prior to Admission medications   Medication Sig Start Date End Date Taking? Authorizing Provider  amoxicillin (AMOXIL) 500 MG capsule Take 2,000 mg by mouth See admin instructions. Take 4 capsules (2000 mg) by mouth one hour prior to dental appointment 07/31/15   [provider]  aspirin EC 81 MG tablet Take 81 mg by mouth daily.    [provider]  Calcium Carbonate-Vitamin D (CALTRATE 600+D) 600-400 MG-UNIT per tablet Take 1 tablet by mouth daily.    [provider]  carbidopa-levodopa (SINEMET IR) 25-100 MG tablet TAKE 1 TABLET BY MOUTH THREE TIMES DAILY, TAKE 4 HOURS APART Patient taking differently: Take 1 tablet by mouth 3 (three) times daily. 0800, 1200, 1600 05/14/19   Lomax, Amy, NP  furosemide (LASIX) 20 MG tablet Take 20 mg by mouth daily.     [provider]  guaiFENesin (MUCINEX) 600 MG 12 hr tablet Take 600 mg by mouth 2 (two) times daily as needed (congestion).    [provider]  metoprolol succinate (TOPROL-XL) 100 MG 24 hr tablet Take 100 mg by mouth daily. Take with or immediately following  a meal.    [provider]  Polyethyl Glycol-Propyl Glycol (SYSTANE OP) Place 1 drop into both eyes daily as needed (dry eyes).    [provider]  vitamin C (ASCORBIC ACID) 250 MG tablet Take 500 mg by mouth daily.    [provider]  Vitamin D, Ergocalciferol, (DRISDOL) 50000 units CAPS capsule Take 50,000 Units by mouth 2 (two) times a week. Tuesdays and Fridays    [provider]    ALLERGIES:  Allergies  Allergen Reactions  . Forteo [Parathyroid Hormone (Recomb)] Other (See Comments)    " strange  feeling and I cannot describe how I felt but the Dr took me off of it"  . Merthiolate [Thimerosal] Other (See Comments)    Tincture Merthiolate-skin blistering    SOCIAL HISTORY:  Social History   Tobacco Use  . Smoking status: Former Smoker    Years: 10.00    Types: Cigarettes    Quit date: 12/06/1968    Years since quitting: 50.9  . Smokeless tobacco: Never Used  . Tobacco comment: tried snuff once but "never could get it wet"  Substance Use Topics  . Alcohol use: No    Alcohol/week: 0.0 standard drinks    FAMILY HISTORY: Family History  Problem Relation Age of Onset  . Colon cancer Mother   . Heart Problems Mother   . Heart Problems Father   . Heart Problems Brother   . Breast cancer Cousin   . Breast cancer Cousin   . Breast cancer Cousin   . Breast cancer Cousin   . Breast cancer Cousin     EXAM: BP (!) 106/45 (BP Location: Left Arm)   Pulse 87   Temp 98 F (36.7 C) (Oral)   Resp 16   Ht 5\' 5"  (1.651 m)   Wt 65 kg   SpO2 96%   BMI 23.85 kg/m  CONSTITUTIONAL: Alert and oriented and responds appropriately to questions.  Elderly, chronically ill-appearing, crying out in pain intermittently HEAD: Normocephalic EYES: Conjunctivae clear, pupils appear equal, EOM appear intact, scleral icterus ENT: normal nose; moist mucous membranes NECK: Supple, normal ROM CARD: RRR; S1 and S2 appreciated; no murmurs, no clicks, no rubs, no gallops RESP: Normal chest excursion without splinting or tachypnea; breath sounds clear and equal bilaterally; no wheezes, no rhonchi, no rales, no hypoxia or respiratory distress, speaking full sentences ABD/GI: Normal bowel sounds; non-distended; soft, patient is quite tender in the right lower quadrant with voluntary guarding, no rebound, she has 2 biliary drains in place -1 in the central abdomen and 1 in the right upper quadrant.  Drain in the right upper quadrant has small amount of redness around it without warmth, induration,  fluctuance, drainage or bleeding.  Drain in the left upper quadrant has no surrounding redness or warmth.  She is not tender in the right upper quadrant or around either of these drains. BACK:  The back appears normal EXT: Normal ROM in all joints; no deformity noted, no edema; no cyanosis SKIN: Jaundice, warm; no rash on exposed skin NEURO: Moves all extremities equally PSYCH: The patient's mood and manner are appropriate.   MEDICAL DECISION MAKING: Patient here with right lower quadrant abdominal pain.  Sister-in-law reports is the first time she has had significant pain.  She is being worked up for possible cholangiocarcinoma.  Is scheduled for an ERCP this morning.  Her drains appear to be in place and draining appropriately.  There is bilious drainage in her bag without blood.  No sign of purulence.  No sign of external cellulitis or abscess.  She is afebrile here with normal vital signs but does appear uncomfortable and is crying out in pain intermittently.  She is actually quite tender in the right lower quadrant.  Her sister-in-law reports that she has not had an appendectomy.  While it is very likely that her symptoms may be secondary to her liver mass, will proceed with CT imaging for further evaluation.  Differential also includes appendicitis, colitis, diverticulitis, bowel obstruction.  Labs have been reviewed/interpreted by myself.  She does have mildly elevated creatinine compared to 2 weeks ago.  Was 1.13 on 4/1 and is now 1.45 today.  Will give IV fluids.  Her liver function tests appear to be slowly trending down compared to previous.  She has no leukocytosis today.  Will provide pain and nausea medicine and gentle IV hydration.  We will keep n.p.o. in anticipation of possible ERCP this morning.  ED PROGRESS: 6:35 AM  Urine shows no sign of infection.  Called CT who will come to get patient now.  Had to drink contrast due to recent sphincterotomy.  7:10 AM  Pt still currently in CT scan.   Signed out to oncoming ED physician to follow-up on CT results.  I reviewed all nursing notes and pertinent previous records as available.  I have interpreted any EKGs, lab and urine results, imaging (as available).    Hailey [name] was evaluated in Emergency Department on 11/06/2019 for the symptoms described in the history of present illness. She was evaluated in the context of the global COVID-19 pandemic, which necessitated consideration that the patient might be at risk for infection with the SARS-CoV-2 virus that causes COVID-19. Institutional protocols and algorithms that pertain to the evaluation of patients at risk for COVID-19 are in a state of rapid change based on information released by regulatory bodies including the CDC and federal and state organizations. These policies and algorithms were followed during the patient's care in the ED.      Hailey [name], Delice Bison, DO 11/06/19 [phone]

## 2019-11-06 NOTE — Transfer of Care (Signed)
Immediate Anesthesia Transfer of Care Note  Patient: Hailey [name]  Procedure(s) Performed: ENDOSCOPIC RETROGRADE CHOLANGIOPANCREATOGRAPHY (ERCP) (N/A ) BILIARY STENT PLACEMENT (N/A ) BILIARY DILATION  Patient Location: PACU  Anesthesia Type:General  Level of Consciousness: drowsy, patient cooperative and responds to stimulation  Airway & Oxygen Therapy: Patient Spontanous Breathing and Patient connected to face mask oxygen  Post-op Assessment: Report given to RN and Post -op Vital signs reviewed and stable  Post vital signs: Reviewed and stable  Last Vitals:  Vitals Value Taken Time  BP 129/56 11/06/19 1620  Temp    Pulse 94 11/06/19 1621  Resp 25 11/06/19 1621  SpO2 100 % 11/06/19 1621  Vitals shown include unvalidated device data.  Last Pain:  Vitals:   11/06/19 1015  TempSrc: Oral  PainSc: 0-No pain         Complications: No apparent anesthesia complications

## 2019-11-06 NOTE — Anesthesia Preprocedure Evaluation (Addendum)
Anesthesia Evaluation  Patient identified by MRN, date of birth, ID band Patient awake    Reviewed: Allergy & Precautions, NPO status , Patient's Chart, lab work & pertinent test results  History of Anesthesia Complications Negative for: history of anesthetic complications  Airway Mallampati: II  TM Distance: >3 FB Neck ROM: Full    Dental no notable dental hx. (+) Dental Advisory Given   Pulmonary neg pulmonary ROS, former smoker,    Pulmonary exam normal        Cardiovascular hypertension, Normal cardiovascular exam     Neuro/Psych Parkinson's negative psych ROS   GI/Hepatic negative GI ROS, Neg liver ROS,   Endo/Other  negative endocrine ROS  Renal/GU Renal InsufficiencyRenal disease  negative genitourinary   Musculoskeletal negative musculoskeletal ROS (+)   Abdominal   Peds  Hematology negative hematology ROS (+)   Anesthesia Other Findings   Reproductive/Obstetrics                            Anesthesia Physical  Anesthesia Plan  ASA: III  Anesthesia Plan: General   Post-op Pain Management:    Induction: Intravenous  PONV Risk Score and Plan: 3 and Ondansetron, Dexamethasone, Treatment may vary due to age or medical condition and Diphenhydramine  Airway Management Planned: Oral ETT  Additional Equipment:   Intra-op Plan:   Post-operative Plan: Extubation in OR  Informed Consent: I have reviewed the patients History and Physical, chart, labs and discussed the procedure including the risks, benefits and alternatives for the proposed anesthesia with the patient or authorized representative who has indicated his/her understanding and acceptance.     Dental advisory given  Plan Discussed with: CRNA and Anesthesiologist  Anesthesia Plan Comments:        Anesthesia Quick Evaluation

## 2019-11-06 NOTE — Discharge Instructions (Addendum)
Keep your appointment for your ERCP today and with interventional radiology tomorrow. The drain on the right side doesn't appear to be positioned appropriately, but this is something that can safely wait to be addressed tomorrow.

## 2019-11-07 ENCOUNTER — Inpatient Hospital Stay (HOSPITAL_COMMUNITY)
Admission: RE | Admit: 2019-11-07 | Discharge: 2019-11-18 | DRG: 408 | Disposition: A | Discharge status: Skilled Nursing Facility | Payer: Medicare Other | Source: Ambulatory Visit | Attending: Internal Medicine | Admitting: Internal Medicine

## 2019-11-07 ENCOUNTER — Other Ambulatory Visit: Payer: Self-pay

## 2019-11-07 ENCOUNTER — Encounter (HOSPITAL_COMMUNITY): Payer: Self-pay

## 2019-11-07 DIAGNOSIS — M19042 Primary osteoarthritis, left hand: Secondary | ICD-10-CM | POA: Diagnosis present

## 2019-11-07 DIAGNOSIS — Z800 Family history of malignant neoplasm of digestive organs: Secondary | ICD-10-CM

## 2019-11-07 DIAGNOSIS — K659 Peritonitis, unspecified: Secondary | ICD-10-CM | POA: Diagnosis present

## 2019-11-07 DIAGNOSIS — E876 Hypokalemia: Secondary | ICD-10-CM | POA: Diagnosis not present

## 2019-11-07 DIAGNOSIS — Z888 Allergy status to other drugs, medicaments and biological substances status: Secondary | ICD-10-CM

## 2019-11-07 DIAGNOSIS — R17 Unspecified jaundice: Secondary | ICD-10-CM | POA: Diagnosis not present

## 2019-11-07 DIAGNOSIS — C221 Intrahepatic bile duct carcinoma: Secondary | ICD-10-CM | POA: Diagnosis present

## 2019-11-07 DIAGNOSIS — Z803 Family history of malignant neoplasm of breast: Secondary | ICD-10-CM

## 2019-11-07 DIAGNOSIS — Z20822 Contact with and (suspected) exposure to covid-19: Secondary | ICD-10-CM | POA: Diagnosis present

## 2019-11-07 DIAGNOSIS — F0280 Dementia in other diseases classified elsewhere without behavioral disturbance: Secondary | ICD-10-CM | POA: Diagnosis present

## 2019-11-07 DIAGNOSIS — R188 Other ascites: Secondary | ICD-10-CM | POA: Diagnosis present

## 2019-11-07 DIAGNOSIS — R600 Localized edema: Secondary | ICD-10-CM | POA: Diagnosis present

## 2019-11-07 DIAGNOSIS — D649 Anemia, unspecified: Secondary | ICD-10-CM | POA: Diagnosis present

## 2019-11-07 DIAGNOSIS — G20A1 Parkinson's disease without dyskinesia, without mention of fluctuations: Secondary | ICD-10-CM | POA: Diagnosis present

## 2019-11-07 DIAGNOSIS — N179 Acute kidney failure, unspecified: Secondary | ICD-10-CM | POA: Diagnosis not present

## 2019-11-07 DIAGNOSIS — C240 Malignant neoplasm of extrahepatic bile duct: Secondary | ICD-10-CM | POA: Diagnosis present

## 2019-11-07 DIAGNOSIS — M19019 Primary osteoarthritis, unspecified shoulder: Secondary | ICD-10-CM | POA: Diagnosis present

## 2019-11-07 DIAGNOSIS — E871 Hypo-osmolality and hyponatremia: Secondary | ICD-10-CM | POA: Diagnosis not present

## 2019-11-07 DIAGNOSIS — I10 Essential (primary) hypertension: Secondary | ICD-10-CM | POA: Diagnosis present

## 2019-11-07 DIAGNOSIS — D72829 Elevated white blood cell count, unspecified: Secondary | ICD-10-CM | POA: Diagnosis present

## 2019-11-07 DIAGNOSIS — Z7982 Long term (current) use of aspirin: Secondary | ICD-10-CM | POA: Diagnosis not present

## 2019-11-07 DIAGNOSIS — G20 Parkinson's disease: Secondary | ICD-10-CM | POA: Diagnosis present

## 2019-11-07 DIAGNOSIS — K831 Obstruction of bile duct: Secondary | ICD-10-CM | POA: Diagnosis present

## 2019-11-07 DIAGNOSIS — K668 Other specified disorders of peritoneum: Secondary | ICD-10-CM | POA: Diagnosis not present

## 2019-11-07 DIAGNOSIS — R1031 Right lower quadrant pain: Secondary | ICD-10-CM | POA: Diagnosis present

## 2019-11-07 DIAGNOSIS — Z79899 Other long term (current) drug therapy: Secondary | ICD-10-CM

## 2019-11-07 DIAGNOSIS — R160 Hepatomegaly, not elsewhere classified: Secondary | ICD-10-CM

## 2019-11-07 DIAGNOSIS — M810 Age-related osteoporosis without current pathological fracture: Secondary | ICD-10-CM | POA: Diagnosis present

## 2019-11-07 DIAGNOSIS — K869 Disease of pancreas, unspecified: Secondary | ICD-10-CM

## 2019-11-07 DIAGNOSIS — Z8249 Family history of ischemic heart disease and other diseases of the circulatory system: Secondary | ICD-10-CM | POA: Diagnosis not present

## 2019-11-07 DIAGNOSIS — M19041 Primary osteoarthritis, right hand: Secondary | ICD-10-CM | POA: Diagnosis present

## 2019-11-07 DIAGNOSIS — R7989 Other specified abnormal findings of blood chemistry: Secondary | ICD-10-CM | POA: Diagnosis present

## 2019-11-07 DIAGNOSIS — Z87891 Personal history of nicotine dependence: Secondary | ICD-10-CM

## 2019-11-07 DIAGNOSIS — G9341 Metabolic encephalopathy: Secondary | ICD-10-CM | POA: Diagnosis present

## 2019-11-07 HISTORY — PX: IR CHOLANGIOGRAM EXISTING TUBE: IMG6040

## 2019-11-07 HISTORY — PX: IR BILIARY DRAIN PLACEMENT WITH CHOLANGIOGRAM: IMG6043

## 2019-11-07 LAB — COMPREHENSIVE METABOLIC PANEL
ALT: 15 U/L (ref 0–44)
AST: 44 U/L — ABNORMAL HIGH (ref 15–41)
Albumin: 2.3 g/dL — ABNORMAL LOW (ref 3.5–5.0)
Alkaline Phosphatase: 174 U/L — ABNORMAL HIGH (ref 38–126)
Anion gap: 12 (ref 5–15)
BUN: 34 mg/dL — ABNORMAL HIGH (ref 8–23)
CO2: 19 mmol/L — ABNORMAL LOW (ref 22–32)
Calcium: 8.9 mg/dL (ref 8.9–10.3)
Chloride: 103 mmol/L (ref 98–111)
Creatinine, Ser: 1.38 mg/dL — ABNORMAL HIGH (ref 0.44–1.00)
GFR calc Af Amer: 42 mL/min — ABNORMAL LOW (ref >60)
GFR calc non Af Amer: 37 mL/min — ABNORMAL LOW (ref >60)
Glucose, Bld: 96 mg/dL (ref 70–99)
Potassium: 5.1 mmol/L (ref 3.5–5.1)
Sodium: 134 mmol/L — ABNORMAL LOW (ref 135–145)
Total Bilirubin: 13.7 mg/dL — ABNORMAL HIGH (ref 0.3–1.2)
Total Protein: 5.6 g/dL — ABNORMAL LOW (ref 6.5–8.1)

## 2019-11-07 LAB — APTT: aPTT: 38 seconds — ABNORMAL HIGH (ref 24–36)

## 2019-11-07 LAB — CBC WITH DIFFERENTIAL/PLATELET
Abs Immature Granulocytes: 0.12 10*3/uL — ABNORMAL HIGH (ref 0.00–0.07)
Basophils Absolute: 0 10*3/uL (ref 0.0–0.1)
Basophils Relative: 0 %
Eosinophils Absolute: 0 10*3/uL (ref 0.0–0.5)
Eosinophils Relative: 0 %
HCT: 32.7 % — ABNORMAL LOW (ref 36.0–46.0)
Hemoglobin: 10.8 g/dL — ABNORMAL LOW (ref 12.0–15.0)
Immature Granulocytes: 1 %
Lymphocytes Relative: 2 %
Lymphs Abs: 0.4 10*3/uL — ABNORMAL LOW (ref 0.7–4.0)
MCH: 32.4 pg (ref 26.0–34.0)
MCHC: 33 g/dL (ref 30.0–36.0)
MCV: 98.2 fL (ref 80.0–100.0)
Monocytes Absolute: 1.5 10*3/uL — ABNORMAL HIGH (ref 0.1–1.0)
Monocytes Relative: 8 %
Neutro Abs: 17 10*3/uL — ABNORMAL HIGH (ref 1.7–7.7)
Neutrophils Relative %: 89 %
Platelets: 330 10*3/uL (ref 150–400)
RBC: 3.33 MIL/uL — ABNORMAL LOW (ref 3.87–5.11)
RDW: 17.2 % — ABNORMAL HIGH (ref 11.5–15.5)
WBC: 19 10*3/uL — ABNORMAL HIGH (ref 4.0–10.5)
nRBC: 0 % (ref 0.0–0.2)

## 2019-11-07 LAB — PROTIME-INR
INR: 1.2 (ref 0.8–1.2)
Prothrombin Time: 15.3 seconds — ABNORMAL HIGH (ref 11.4–15.2)

## 2019-11-07 LAB — LACTIC ACID, PLASMA: Lactic Acid, Venous: 0.8 mmol/L (ref 0.5–1.9)

## 2019-11-07 LAB — MAGNESIUM: Magnesium: 1.8 mg/dL (ref 1.7–2.4)

## 2019-11-07 MED ORDER — ONDANSETRON HCL 4 MG PO TABS: 4.0000 mg | ORAL_TABLET | Freq: Four times a day (QID) | ORAL | Status: DC | PRN

## 2019-11-07 MED ORDER — MIDAZOLAM HCL 2 MG/2ML IJ SOLN
INTRAMUSCULAR | Status: AC
  Filled 2019-11-07: qty 2

## 2019-11-07 MED ORDER — PIPERACILLIN-TAZOBACTAM 3.375 G IVPB
3.3750 g | Freq: Three times a day (TID) | INTRAVENOUS | Status: DC
  Administered 2019-11-07 – 2019-11-16 (×25): 3.375 g via INTRAVENOUS
  Filled 2019-11-07 (×25): qty 50

## 2019-11-07 MED ORDER — LIDOCAINE HCL 1 % IJ SOLN
INTRAMUSCULAR | Status: AC | PRN
  Administered 2019-11-07: 5 mL

## 2019-11-07 MED ORDER — HYDROMORPHONE HCL 1 MG/ML IJ SOLN
0.5000 mg | INTRAMUSCULAR | Status: DC | PRN
  Administered 2019-11-07 – 2019-11-08 (×3): 0.5 mg via INTRAVENOUS
  Filled 2019-11-07 (×3): qty 1

## 2019-11-07 MED ORDER — LIDOCAINE HCL 1 % IJ SOLN
INTRAMUSCULAR | Status: AC
  Filled 2019-11-07: qty 20

## 2019-11-07 MED ORDER — SODIUM CHLORIDE 0.9 % IV SOLN: INTRAVENOUS | Status: DC

## 2019-11-07 MED ORDER — CARBIDOPA-LEVODOPA 25-100 MG PO TABS
1.0000 | ORAL_TABLET | Freq: Three times a day (TID) | ORAL | Status: DC
  Administered 2019-11-07 – 2019-11-18 (×32): 1 via ORAL
  Filled 2019-11-07 (×32): qty 1

## 2019-11-07 MED ORDER — FENTANYL CITRATE (PF) 100 MCG/2ML IJ SOLN
INTRAMUSCULAR | Status: AC | PRN
  Administered 2019-11-07 (×4): 25 ug via INTRAVENOUS

## 2019-11-07 MED ORDER — ASPIRIN EC 81 MG PO TBEC
81.0000 mg | DELAYED_RELEASE_TABLET | Freq: Every day | ORAL | Status: DC
  Administered 2019-11-08 – 2019-11-18 (×10): 81 mg via ORAL
  Filled 2019-11-07 (×10): qty 1

## 2019-11-07 MED ORDER — METOPROLOL SUCCINATE ER 100 MG PO TB24
100.0000 mg | ORAL_TABLET | Freq: Every day | ORAL | Status: DC
  Administered 2019-11-08 – 2019-11-18 (×11): 100 mg via ORAL
  Filled 2019-11-07 (×11): qty 1

## 2019-11-07 MED ORDER — FENTANYL CITRATE (PF) 100 MCG/2ML IJ SOLN
INTRAMUSCULAR | Status: AC
  Filled 2019-11-07: qty 2

## 2019-11-07 MED ORDER — MIDAZOLAM HCL 2 MG/2ML IJ SOLN
INTRAMUSCULAR | Status: AC | PRN
  Administered 2019-11-07: 0.5 mg via INTRAVENOUS
  Administered 2019-11-07: 1 mg via INTRAVENOUS
  Administered 2019-11-07: 0.5 mg via INTRAVENOUS

## 2019-11-07 MED ORDER — ACETAMINOPHEN 325 MG PO TABS
650.0000 mg | ORAL_TABLET | Freq: Four times a day (QID) | ORAL | Status: DC | PRN
  Administered 2019-11-07 – 2019-11-10 (×3): 650 mg via ORAL
  Filled 2019-11-07 (×3): qty 2

## 2019-11-07 MED ORDER — IOHEXOL 300 MG/ML  SOLN
50.0000 mL | Freq: Once | INTRAMUSCULAR | Status: AC | PRN
  Administered 2019-11-07: 12:00:00 50 mL

## 2019-11-07 MED ORDER — ONDANSETRON HCL 4 MG/2ML IJ SOLN: 4.0000 mg | Freq: Four times a day (QID) | INTRAMUSCULAR | Status: DC | PRN

## 2019-11-07 MED ORDER — ACETAMINOPHEN 650 MG RE SUPP: 650.0000 mg | Freq: Four times a day (QID) | RECTAL | Status: DC | PRN

## 2019-11-07 MED ORDER — PIPERACILLIN-TAZOBACTAM 3.375 G IVPB
3.3750 g | Freq: Once | INTRAVENOUS | Status: AC
  Administered 2019-11-07: 3.375 g via INTRAVENOUS
  Filled 2019-11-07: qty 50

## 2019-11-07 NOTE — Progress Notes (Signed)
Attempted, no answer/bt

## 2019-11-07 NOTE — H&P (Signed)
Chief Complaint: Patient was seen in consultation today for biliary obstruction  Referring Physician(s): Shick,Michael  Supervising Physician: Markus Daft  Patient Status: [facility] - Out-pt  History of Present Illness: Hailey [name] is a 79 y.o. 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 She underwent attempt of internal biliary drain placement 10/18/19, however ultimately left with right-sided external biliary drain placement due to inability to pass the obstruction.  She returned 10/24/19 for reattempt to pass the obstruction, however this was unsuccessful and a new left-sided external biliary drain was placed.  In the interim she has also undergone several attempts at ERCP to pass the stricture; s/p ERCP 08/27/19, 10/15/19, and repeat yesterday 11/06/19.   From OP note 4/14: Findings:      The major papilla was normal. Deep selective cannulation was readily       obtained with the sphincterotome and unfortunately we could not advance       the wire into the intrahepatics and we switched to the adjustable       balloon and the biliary tree was swept with a 12- 15 mm adjustable       balloon starting at the upper third of the main bile duct. Sludge was       swept from the duct. We continued to try to advance the wire through the       inflated balloon at various different locations in the main CBD but we       were able to switch to a smaller more flexible wire and we were able to       advance into the intrahepatics we thought unfortunately we could not       advance the catheter to follow the wire nor could we at this time get       died to fill the intrahepatics and we went ahead and advanced the       dilation balloon starting with a 4 mm balloon by 2 cm and were able to       advance it partially further after a few dilations and we could inject       dye at this point did fill the intrahepatics unfortunately we could not       advance either  the 8 [place] or 10 [place] uncovered metal stent past the       obstruction so we proceeded with dilation of the common bile duct with a       4 mm balloon dilator by 4 cm which was successful. Unfortunately we       still could not advance the balloon past the stricture and we again were       unsuccessful at retrying the 10 [place] stent so we replaced our balloon       catheter with the balloon inflated and remove the smaller more flexible       wire and reinserted a long wire and retried first the 10 French stent       which was unsuccessful but finally after a prolonged effort one 8 Fr by       6 cm intraductal uncovered metal stent was placed 6 cm into the common       hepatic duct. Bile flowed through the stent. The stent was in good       position. The patient tolerated the procedure well there was no obvious       immediate complication and there was no pancreatic duct  injection or       wire advancement throughout the procedure.   Patient returns to Interventional Radiology today for evaluation of her external biliary drains as well as ongoing attempts for internalization, and possible brush biopsies.   She is accompanied by her sister-in-law Hailey [name] who helps her with her medical decisions and provides care at home.  Hailey [name] is able to answer all orientation questions correctly and repeat to me our plans for today, however she does not remember having ERCP yesterday or any of the results from that visit. Hailey [name] has lived at home independently in the past, however for the past 1 month has required more care due to progressive mild dementia as well as her acute care needs.  Per patient and Hailey [name], the drains have been functioning well at home.  No issues since their last complaint of some leakage from around the tube.  There is no external leakage today. Hailey [name] believes the output has been less overnight since ERCP yesterday.  The patient has been NPO.  She does not take blood  thinners.   Past Medical History:  Diagnosis Date   Arthritis    in shoulder , hands    Bilateral leg edema    takes lasix as needed for dependent edema    H/O exercise stress test    4-5 yrs. ago, told that it was wnl , done at [place] office    Headache    MH: Migraines   Hypertension    Osteoporosis    Parkinson disease ([place])    Wears glasses     Past Surgical History:  Procedure Laterality Date   BILIARY BRUSHING  08/27/2019   Procedure: BILIARY BRUSHING;  Surgeon: Clarene Essex, MD;  Location: [facility];  Service: Endoscopy;;   BIOPSY  08/27/2019   Procedure: BIOPSY;  Surgeon: Clarene Essex, MD;  Location: [facility];  Service: Endoscopy;;   BREAST CYST EXCISION Left 1994   benign   cataract surgery Bilateral 2007   ERCP N/A 08/27/2019   Procedure: ENDOSCOPIC RETROGRADE CHOLANGIOPANCREATOGRAPHY (ERCP);  Surgeon: Clarene Essex, MD;  Location: [facility];  Service: Endoscopy;  Laterality: N/A;  balloon sweep   ERCP N/A 10/15/2019   Procedure: ENDOSCOPIC RETROGRADE CHOLANGIOPANCREATOGRAPHY (ERCP);  Surgeon: Clarene Essex, MD;  Location: [place];  Service: Endoscopy;  Laterality: N/A;   EYE SURGERY     /w IOL   FOOT TUMOR EXCISION Left 1992   benign   IR BILIARY DRAIN PLACEMENT WITH CHOLANGIOGRAM  10/18/2019   IR BILIARY DRAIN PLACEMENT WITH CHOLANGIOGRAM  10/24/2019   IR EXCHANGE BILIARY DRAIN  10/24/2019   MOUTH SURGERY     REVERSE SHOULDER ARTHROPLASTY Right 12/26/2013   Procedure: RIGHT SHOULDER REVERSE ARTHROPLASTY;  Surgeon: Marin Shutter, MD;  Location: [place];  Service: Orthopedics;  Laterality: Right;   SPHINCTEROTOMY  10/15/2019   Procedure: SPHINCTEROTOMY;  Surgeon: Clarene Essex, MD;  Location: [facility];  Service: Endoscopy;;   SPYGLASS CHOLANGIOSCOPY N/A 08/27/2019   Procedure: [id number] CHOLANGIOSCOPY;  Surgeon: Clarene Essex, MD;  Location: [facility];  Service: Endoscopy;  Laterality: N/A;   SPYGLASS CHOLANGIOSCOPY N/A 10/15/2019    Procedure: SPYGLASS CHOLANGIOSCOPY;  Surgeon: Clarene Essex, MD;  Location: [place];  Service: Endoscopy;  Laterality: N/A;   TOTAL KNEE ARTHROPLASTY Right 04/09/2018   Procedure: RIGHT TOTAL KNEE ARTHROPLASTY;  Surgeon: Gaynelle Arabian, MD;  Location: [facility];  Service: Orthopedics;  Laterality: Right;  Adductor Block    Allergies: Forteo [parathyroid hormone (recomb)] and Merthiolate [thimerosal]  Medications: Prior to Admission medications   Medication Sig Start Date End Date Taking? Authorizing Provider  aspirin EC 81 MG tablet Take 81 mg by mouth daily.   Yes [provider]  Calcium Carbonate-Vitamin D (CALTRATE 600+D) 600-400 MG-UNIT per tablet Take 1 tablet by mouth daily.   Yes [provider]  carbidopa-levodopa (SINEMET IR) 25-100 MG tablet TAKE 1 TABLET BY MOUTH THREE TIMES DAILY, TAKE 4 HOURS APART Patient taking differently: Take 1 tablet by mouth 3 (three) times daily. 0800, 1200, 1600 05/14/19  Yes Lomax, Amy, NP  furosemide (LASIX) 20 MG tablet Take 20 mg by mouth daily.    Yes [provider]  metoprolol succinate (TOPROL-XL) 100 MG 24 hr tablet Take 100 mg by mouth daily. Take with or immediately following a meal.   Yes [provider]  Polyethyl Glycol-Propyl Glycol (SYSTANE OP) Place 1 drop into both eyes daily as needed (dry eyes).   Yes [provider]  vitamin C (ASCORBIC ACID) 250 MG tablet Take 500 mg by mouth daily.   Yes [provider]  Vitamin D, Ergocalciferol, (DRISDOL) 50000 units CAPS capsule Take 50,000 Units by mouth 2 (two) times a week. Tuesdays and Fridays   Yes [provider]  amoxicillin (AMOXIL) 500 MG capsule Take 2,000 mg by mouth See admin instructions. Take 4 capsules (2000 mg) by mouth one hour prior to dental appointment 07/31/15   [provider]  guaiFENesin (MUCINEX) 600 MG 12 hr tablet Take 600 mg by mouth 2 (two) times daily as needed (congestion).    [provider]  oxycodone (OXY-IR) 5 MG capsule Take 1 capsule (5 mg total) by mouth every 8 (eight) hours as needed. 11/06/19   Virgel Manifold, MD     Family History  Problem Relation Age of Onset   Colon cancer Mother    Heart Problems Mother    Heart Problems Father    Heart Problems Brother    Breast cancer Cousin    Breast cancer Cousin    Breast cancer Cousin    Breast cancer Cousin    Breast cancer Cousin     Social History   Socioeconomic History   Marital status: Single    Spouse name: Not on file   Number of children: 0   Years of education: 12   Highest education level: Not on file  Occupational History   Not on file  Tobacco Use   Smoking status: Former Smoker    Years: 10.00    Types: Cigarettes    Quit date: 12/06/1968    Years since quitting: 50.9   Smokeless tobacco: Never Used   Tobacco comment: tried snuff once but "never could get it wet"  Substance and Sexual Activity   Alcohol use: No    Alcohol/week: 0.0 standard drinks   Drug use: No   Sexual activity: Not on file  Other Topics Concern   Not on file  Social History Narrative   Lives at home by herself.   Right handed.   Caffeine use: 2 glasses tea/day    Social Determinants of Health   Financial Resource Strain:    Difficulty of Paying Living Expenses:   Food Insecurity:    Worried About Charity fundraiser in the Last Year:    Arboriculturist in the Last Year:   Transportation Needs:    Film/video editor (Medical):    Lack of Transportation (Non-Medical):   Physical Activity:  Days of Exercise per Week:    Minutes of Exercise per Session:   Stress:    Feeling of Stress :   Social Connections:    Frequency of Communication with Friends and Family:    Frequency of Social Gatherings with Friends and Family:    Attends Religious Services:    Active Member of Clubs or Organizations:    Attends Archivist Meetings:    Marital Status:       Review of Systems: A 12 point ROS discussed and pertinent positives are indicated in the HPI above.  All other systems are negative.  Review of Systems  Constitutional: Negative for fatigue and fever.  Respiratory: Negative for cough and shortness of breath.   Cardiovascular: Negative for chest pain.  Gastrointestinal: Negative for abdominal pain, nausea and vomiting.  Genitourinary: Negative for dysuria.  Musculoskeletal: Negative for back pain.  Psychiatric/Behavioral: Negative for behavioral problems and confusion.    Vital Signs: BP (!) 128/54    Pulse 82    Temp (!) 97.5 F (36.4 C) (Skin)    Resp 14    Ht 5\' 5"  (1.651 m)    Wt 147 lb (66.7 kg)    SpO2 100%    BMI 24.46 kg/m   Physical Exam Vitals and nursing note reviewed.  Constitutional:      General: She is not in acute distress.    Appearance: Normal appearance. She is not ill-appearing.  HENT:     Mouth/Throat:     Mouth: Mucous membranes are moist.     Pharynx: Oropharynx is clear.  Cardiovascular:     Rate and Rhythm: Normal rate and regular rhythm.  Pulmonary:     Effort: Pulmonary effort is normal. No respiratory distress.     Breath sounds: Normal breath sounds.  Abdominal:     General: Abdomen is flat. There is no distension.     Palpations: Abdomen is soft.     Tenderness: There is no abdominal tenderness.     Comments: Right and left external biliary drains in place.  Both insertion sites are c/d/i.  Dark green drainage into both collection bags. No external leakage.  Neurological:     Mental Status: She is alert.      MD Evaluation Airway: WNL Heart: WNL Abdomen: WNL Chest/ Lungs: WNL ASA  Classification: 3 Mallampati/Airway Score: One   Imaging: CT ABDOMEN PELVIS W CONTRAST  Result Date: 11/06/2019 CLINICAL DATA:  Right lower quadrant pain and jaundice. EXAM: CT ABDOMEN AND PELVIS WITH CONTRAST TECHNIQUE: Multidetector CT imaging of the abdomen and pelvis was performed using the  standard protocol following bolus administration of intravenous contrast. CONTRAST:  54mL OMNIPAQUE IOHEXOL 300 MG/ML  SOLN COMPARISON:  10/09/2019 liver MRI FINDINGS: Lower chest:  Mild atelectasis or scarring in the lower lungs. Hepatobiliary: Amorphouscentral hepatic mass poorly defined on this study, roughly 2.9 cm on coronal reformats. There is intrahepatic bile duct obstruction with left and right biliary drains. The right-sided drain has been retracted into the abdominal wall. The left-sided drain is in position over the liver, although there is still prominent left-sided ductal dilatation at the level of segment to. 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 Spleen: Unremarkable. Adrenals/Urinary Tract: Negative adrenals. No hydronephrosis or stone. Unremarkable bladder. Stomach/Bowel: No obstruction. No appendicitis. Numerous left colonic diverticula. Vascular/Lymphatic: No acute vascular abnormality. Atherosclerotic calcification of the aorta. No mass or adenopathy. Reproductive:No pathologic findings. Other:  No ascites or pneumoperitoneum. Shallow bilateral groin hernia with fluid density on the right. Musculoskeletal: No acute abnormalities. L5-S1 advanced facet osteoarthritis with anterolisthesis and accelerated disc narrowing. IMPRESSION: Hepatic hilar mass with biliary obstruction and percutaneous drainage catheters. The right-sided drain has been retracted into the abdominal wall. The left-sided drain is in place although there is still prominent dilation of segment 2 ducts. Electronically Signed   By: Monte Fantasia M.D.   On: 11/06/2019 07:31   MR 3D Recon At Scanner  Result Date: 10/10/2019 CLINICAL DATA:  Follow-up of pancreatic lesions. EXAM: MRI ABDOMEN WITHOUT AND WITH CONTRAST (INCLUDING MRCP) TECHNIQUE: Multiplanar multisequence MR imaging of the abdomen was performed both before and  after the administration of intravenous contrast. Heavily T2-weighted images of the biliary and pancreatic ducts were obtained, and three-dimensional MRCP images were rendered by post processing. CONTRAST:  59mL GADAVIST GADOBUTROL 1 MMOL/ML IV SOLN COMPARISON:  06/25/2019 FINDINGS: Portions of exam are mildly motion degraded. Lower chest: Right lower lobe scarring. Normal heart size without pericardial or pleural effusion. Hepatobiliary: Progression of massive lateral segment left liver lobe intrahepatic biliary duct dilatation. Progression of moderate biliary duct dilatation throughout the remainder of the liver. This intrahepatic duct dilatation continues to the level of an amorphous area of heterogeneous delayed hyperenhancement within the central left hepatic lobe which measures 2.8 x 2.5 cm on 38/40. Increased from 2.1 x 1.6 cm on the prior exam (when remeasured). The common duct is normal in caliber, without choledocholithiasis. Pancreas: Complex cystic lesion within the pancreatic neck/body is similar at 3.0 x 2.4 cm on 15/5. No abnormal postcontrast enhancement. Smaller cystic lesions within the body and uncinate process are similar and of doubtful clinical significance. No acute pancreatitis. Spleen:  Normal in size, without focal abnormality. Adrenals/Urinary Tract: Normal adrenal glands. Normal kidneys, without hydronephrosis. Stomach/Bowel: Normal stomach and abdominal bowel loops. Vascular/Lymphatic: Aortic atherosclerosis. Chronic left portal vein occlusion. Retroaortic left renal vein. No abdominal adenopathy. Other:  No ascites. Musculoskeletal: No acute osseous abnormality. IMPRESSION: 1. Mildly motion degraded exam. 2. Progressive biliary duct dilatation secondary to an amorphous mass within the central left hepatic lobe, highly suspicious for cholangiocarcinoma. Consider biliary drainage. 3. 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 Although by consensus criteria, this warrants follow-up with MRI at 1 year, given comorbidities, of questionable clinical significance. 4.  Aortic Atherosclerosis (ICD10-I70.0). Electronically Signed   By: Abigail Miyamoto M.D.   On: 10/10/2019 08:38   DG ERCP BILIARY & PANCREATIC DUCTS  Result Date: 11/07/2019 CLINICAL DATA:  79 year old female with biliary obstruction EXAM: ERCP TECHNIQUE: Multiple spot images obtained with the fluoroscopic device and submitted for interpretation post-procedure. FLUOROSCOPY TIME:  Fluoroscopy Time:  26 minutes 44 seconds COMPARISON:  CT 11/06/2019, fluoro 10/18/2019, 10/24/2019 FINDINGS: Limited intraoperative fluoroscopic spot images during ERCP. Initial image demonstrates the endoscope projecting over the upper abdomen with safety wire in the common bile duct. Partial opacification of the extrahepatic biliary ducts. Subsequently there is deployment of both a retrieval balloon as well as a metallic biliary stent at the site of stricture/occlusion. IMPRESSION: Limited intraoperative fluoroscopic spot images demonstrate treatment of biliary obstruction with deployment of both retrieval balloon as well as metallic biliary stent. Please refer to the dictated operative report for full details of intraoperative findings and procedure. Electronically Signed   By: Corrie Mckusick D.O.   On: 11/07/2019 07:30   DG ERCP BILIARY & PANCREATIC DUCTS  Result Date: 10/15/2019 CLINICAL  DATA:  Elevated liver enzymes. EXAM: ERCP TECHNIQUE: Multiple spot images obtained with the fluoroscopic device and submitted for interpretation post-procedure. COMPARISON:  MRCP-10/09/2018 FLUOROSCOPY TIME:  26 minutes, 12 seconds FINDINGS: Single spot fluoroscopic image of the right upper abdominal quadrant is provided for review. An ERCP probe overlies the right upper abdominal quadrant. There is selective cannulation and opacification of CBD. There 4 apparent nonocclusive filling defects within  the distal aspect of the CBD which could represent air bubbles. There is an apparent narrowing involving the mid aspect of the CBD with insufflation of a balloon central to this apparent narrowing with subsequent presumed biliary sweeping. There is no significant opacification of the intrahepatic biliary tree, cystic or pancreatic ducts. IMPRESSION: ERCP as above. These images were submitted for radiologic interpretation only. Please see the procedural report for the amount of contrast and the fluoroscopy time utilized. Electronically Signed   By: Sandi Mariscal M.D.   On: 10/15/2019 16:04   MR ABDOMEN MRCP W WO CONTAST  Result Date: 10/10/2019 CLINICAL DATA:  Follow-up of pancreatic lesions. EXAM: MRI ABDOMEN WITHOUT AND WITH CONTRAST (INCLUDING MRCP) TECHNIQUE: Multiplanar multisequence MR imaging of the abdomen was performed both before and after the administration of intravenous contrast. Heavily T2-weighted images of the biliary and pancreatic ducts were obtained, and three-dimensional MRCP images were rendered by post processing. CONTRAST:  73mL GADAVIST GADOBUTROL 1 MMOL/ML IV SOLN COMPARISON:  06/25/2019 FINDINGS: Portions of exam are mildly motion degraded. Lower chest: Right lower lobe scarring. Normal heart size without pericardial or pleural effusion. Hepatobiliary: Progression of massive lateral segment left liver lobe intrahepatic biliary duct dilatation. Progression of moderate biliary duct dilatation throughout the remainder of the liver. This intrahepatic duct dilatation continues to the level of an amorphous area of heterogeneous delayed hyperenhancement within the central left hepatic lobe which measures 2.8 x 2.5 cm on 38/40. Increased from 2.1 x 1.6 cm on the prior exam (when remeasured). The common duct is normal in caliber, without choledocholithiasis. Pancreas: Complex cystic lesion within the pancreatic neck/body is similar at 3.0 x 2.4 cm on 15/5. No abnormal postcontrast enhancement.  Smaller cystic lesions within the body and uncinate process are similar and of doubtful clinical significance. No acute pancreatitis. Spleen:  Normal in size, without focal abnormality. Adrenals/Urinary Tract: Normal adrenal glands. Normal kidneys, without hydronephrosis. Stomach/Bowel: Normal stomach and abdominal bowel loops. Vascular/Lymphatic: Aortic atherosclerosis. Chronic left portal vein occlusion. Retroaortic left renal vein. No abdominal adenopathy. Other:  No ascites. Musculoskeletal: No acute osseous abnormality. IMPRESSION: 1. Mildly motion degraded exam. 2. Progressive biliary duct dilatation secondary to an amorphous mass within the central left hepatic lobe, highly suspicious for cholangiocarcinoma. Consider biliary drainage. 3. 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 Although by consensus criteria, this warrants follow-up with MRI at 1 year, given comorbidities, of questionable clinical significance. 4.  Aortic Atherosclerosis (ICD10-I70.0). Electronically Signed   By: Abigail Miyamoto M.D.   On: 10/10/2019 08:38   IR BILIARY DRAIN PLACEMENT WITH CHOLANGIOGRAM  Result Date: 10/24/2019 INDICATION: Jaundice, high biliary confluence obstruction EXAM: Exchange of the right external only biliary drain Ultrasound and fluoroscopic insertion of a left external only biliary drain Unsuccessful attempt at crossing the high biliary obstruction from either side. MEDICATIONS: 3.375 g Zosyn; The antibiotic was administered within an appropriate time frame prior to the initiation of the procedure. ANESTHESIA/SEDATION: Moderate (conscious) sedation was employed during this procedure. A total of Versed 1.5 mg and Fentanyl 75  mcg was administered intravenously. Moderate Sedation Time: 39 minutes. The patient's level of consciousness and vital signs were monitored continuously by radiology nursing throughout the procedure under my direct  supervision. FLUOROSCOPY TIME:  Fluoroscopy Time: 13 minutes 36 seconds (75 mGy). COMPLICATIONS: None immediate. PROCEDURE: Informed written consent was obtained from the patient after a thorough discussion of the procedural risks, benefits and alternatives. All questions were addressed. Maximal Sterile Barrier Technique was utilized including caps, mask, sterile gowns, sterile gloves, sterile drape, hand hygiene and skin antiseptic. A timeout was performed prior to the initiation of the procedure. Right biliary drain procedure: Under sterile conditions and local anesthesia, the external only right biliary drain was injected with contrast confirming position at the confluence. Obstruction remains. Catheter removed over an Amplatz guidewire. Eight French sheath inserted. Attempts were made to cross the central biliary obstruction with catheters and guidewires. This is unsuccessful. Over the Amplatz guidewire, a new Bettey Mare 10 [place] biliary drain was advanced with the retention loop formed in the dilated central right ducts. Contrast injection confirms position. Images obtained for documentation. Left biliary drain procedure: Ultrasound performed of the left hepatic lobe through a subxiphoid window. This was correlated with the MRI scan. There is marked dilatation of the left ducts which are undrained by the right side only external drain. Under ultrasound guidance, a dilated left hepatic duct was accessed peripherally from a subxiphoid approach. Images obtained for documentation. There was return of clear bile. Micro dilator inserted followed by the Accustick dilator set. Amplatz guidewire inserted followed by a Kumpe catheter. Catheter guidewire manipulation performed to attempt crossing the central biliary high obstruction from the left duct access. This also was unsuccessful. Over the Amplatz guidewire, a 10 French Bettey Mare catheter was advanced with the retention loop formed in the dilated left  biliary tree. Images obtained for documentation. Both catheters secured with Prolene sutures and external gravity drainage bag. No immediate complication. Patient tolerated the procedure well. IMPRESSION: Fluoroscopic exchange of the right external only biliary drain. Successful ultrasound and fluoroscopic insertion of a left external only biliary drain Unsuccessful attempt at crossing the high biliary obstruction from either side. Electronically Signed   By: Jerilynn Mages.  Shick M.D.   On: 10/24/2019 13:23   IR BILIARY DRAIN PLACEMENT WITH CHOLANGIOGRAM  Result Date: 10/18/2019 INDICATION: Pancreatic lesions, jaundice, central left hepatic lobe mass with extensive intrahepatic biliary ductal dilatation. Unsuccessful ERCP drainage. EXAM: PERCUTANEOUS TRANSHEPATIC CHOLANGIOGRAM PERCUTANEOUS  EXTERNAL BILIARY DRAIN CATHETER PLACEMENT MEDICATIONS: As antibiotic prophylaxis, Zosyn 3.75 g was ordered pre-procedure and administered intravenously within one hour of incision. ANESTHESIA/SEDATION: Intravenous Fentanyl 30mcg and Versed 1.5mg  were administered as conscious sedation during continuous monitoring of the patient's level of consciousness and physiological / cardiorespiratory status by the radiology RN, with a total moderate sedation time of 43 minutes. PROCEDURE: Informed written consent was obtained from the patient after a thorough discussion of the procedural risks, benefits and alternatives. All questions were addressed. Maximal Sterile Barrier Technique was utilized including caps, mask, sterile gowns, sterile gloves, sterile drape, hand hygiene and skin antiseptic. A timeout was performed prior to the initiation of the procedure. Dilated intrahepatic biliary tree was identified on ultrasound and a peripheral right lower lobe duct was selected for approach. Skin and subcutaneous tissues down to the liver capsule infiltrated with 1% lidocaine. Under real-time ultrasound guidance, a 21 gauge micropuncture needle was  advanced into the duct. Small contrast injection under fluoroscopy confirmed appropriate positioning. A 018 guidewire advanced centrally. A 5 [place] micropuncture  dilator was placed. Contrast injection for percutaneous transhepatic cholangiogram was performed. Catheter exchanged over an angled Glidewire for a 5 [place] Kumpe catheter, advanced to the biliary confluence for additional cholangiographic images. The Kumpe catheter was exchanged for a 7 [place] vascular sheath which allowed attempts to traverse the CBD, ultimately unsuccessful. The catheter and sheath were removed over the guidewire and a 10 French Dawson Mueller pigtail drain catheter was advanced and formed centrally at the biliary confluence. Catheter was secured externally with 0 Prolene suture and StatLock, placed to external drain bag to maximize biliary decompression. The patient tolerated the procedure well. FLUOROSCOPY TIME:  12 minutes 24 seconds; [id number] mGy COMPLICATIONS: None immediate. FINDINGS: The percutaneous transhepatic cholangiogram confirms marked intrahepatic biliary ductal dilatation. No intraluminal filling defects are evident. There appears to be cut off of multiple central right ducts although the guidewire passes from 1 segment to another. The left biliary tree was not opacified. The CBD was not opacified and various attempts at catheter and guidewire manipulation across the CBD were ultimately unsuccessful. Therefore, a 80 [place] external biliary drainage catheter placed as above. IMPRESSION: 1. 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 Access across the CBD could not be achieved. 2. Technically successful right external biliary drain catheter placement. Patient will return next week after several days of external biliary decompression, for attempts at internalization of drain, possible brush biopsy, possible stenting. Electronically Signed   By: Lucrezia Europe M.D.   On: 10/18/2019 16:21    Labs:  CBC: Recent  Labs    10/18/19 1020 10/24/19 0833 11/06/19 0248 11/07/19 0803  WBC 5.5 6.4 10.9* 19.0*  HGB 11.5* 10.5* 12.0 10.8*  HCT 33.4* 31.7* 36.3 32.7*  PLT 347 345 421* 330    COAGS: Recent Labs    10/18/19 1020 10/24/19 0833  INR 1.0 1.0    BMP: Recent Labs    10/09/19 2015 10/18/19 1020 10/24/19 0833 11/06/19 0248  NA  --  141 139 136  K  --  3.9 3.8 4.1  CL  --  105 107 105  CO2  --  23 21* 18*  GLUCOSE  --  111* 109* 145*  BUN  --  35* 35* 39*  CALCIUM  --  9.6 9.1 9.2  CREATININE 0.90 1.16* 1.13* 1.45*  GFRNONAA  --  45* 47* 34*  GFRAA  --  52* 54* 40*    LIVER FUNCTION TESTS: Recent Labs    10/18/19 1020 10/24/19 0833 11/06/19 0248  BILITOT 23.0* 13.6* 12.3*  AST 51* 66* 67*  ALT 15 61* 19  ALKPHOS 361* 246* 235*  PROT 6.4* 6.0* 6.3*  ALBUMIN 3.1* 2.7* 2.7*    TUMOR MARKERS: No results for input(s): AFPTM, CEA, CA199, CHROMGRNA in the last 8760 hours.  Assessment and Plan: Patient with past medical history of biliary obstruction with inability to pass internal stent x2 in the past presents after stent placement in ERCP yesterday with Dr. Watt Climes.    Case reviewed by Dr. Anselm Pancoast who approves patient for biliary drain evaluation, possible drain exchange, possible removal, hopeful for internalization past remaining obstruction if possible. If able to pass obstruction, could also consider obtaining brush biopsies.   Patient presents today in their usual state of health.  She has been NPO and is not currently on blood thinners.   Risks and benefits discussed with the patient as well as her sister-in-law, Hailey [name], including, but not limited to bleeding, infection which may lead to sepsis or even death and damage  to adjacent structures.  This interventional procedure involves the use of X-rays and because of the nature of the planned procedure, it is possible that we will have prolonged use of X-ray fluoroscopy.  Potential radiation risks to you include (but are  not limited to) the following: - A slightly elevated risk for cancer  several years later in life. This risk is typically less than 0.5% percent. This risk is low in comparison to the normal incidence of human cancer, which is 33% for women and 50% for men according to the [place]. - Radiation induced injury can include skin redness, resembling a rash, tissue breakdown / ulcers and hair loss (which can be temporary or permanent).   The likelihood of either of these occurring depends on the difficulty of the procedure and whether you are sensitive to radiation due to previous procedures, disease, or genetic conditions.   IF your procedure requires a prolonged use of radiation, you will be notified and given written instructions for further action.  It is your responsibility to monitor the irradiated area for the 2 weeks following the procedure and to notify your physician if you are concerned that you have suffered a radiation induced injury.    All of the patient's questions were answered, patient is agreeable to proceed.  Consent signed and in chart.  Thank you for this interesting consult.  I greatly enjoyed meeting Hailey [name] and look forward to participating in their care.  A copy of this report was sent to the requesting provider on this date.  Electronically Signed: Docia Barrier, PA 11/07/2019, 9:32 AM   I spent a total of    25 Minutes in face to face in clinical consultation, greater than 50% of which was counseling/coordinating care for biliary obstruction.

## 2019-11-07 NOTE — Progress Notes (Signed)
Patient ID: Hailey [name], female   DOB: 1940-11-30, 79 y.o.   MRN: [id number] Pt tent scheduled for PTC with attempt at replacement of right sided biliary drain placement on 4/16. See today's H&P along with procedure note from Dr. Anselm Pancoast for additional details. Pt currently stable and without sig abd pain,N/V. Temp 99.9; on IV Zosyn q 8 hrs; details/risks of biliary drain placement, incl but not limited to internal bleeding, worsening infection, injury to adjacent structures d/w pt/sister in law [place] with their understanding and consent.

## 2019-11-07 NOTE — Progress Notes (Signed)
Pharmacy Antibiotic Note  Hailey [name] is a 79 y.o. female admitted on 11/07/2019 with complex intra-abdominal infection.  Pharmacy has been consulted for piperacillin/tazobactam dosing.  Patient with biliary obstruction s/p 4/15 ERCP with stent placement, extravasated contrast around new L intrahepatic duct drain with plans for another R hepatic drain later. WBC 19, afebrile, Scr 1.38 trending down.   Plan: Start pip/tazo 3.375g Q8hr Monitor cultures, clinical status, renal fx Narrow abx as able and f/u duration   Height: 5\' 5"  (165.1 cm) Weight: 66.7 kg (147 lb) IBW/kg (Calculated) : 57  Temp (24hrs), Avg:98.8 F (37.1 C), Min:97.5 F (36.4 C), Max:100.2 F (37.9 C)  Recent Labs  Lab 11/06/19 0248 11/07/19 0803  WBC 10.9* 19.0*  CREATININE 1.45* 1.38*    Estimated Creatinine Clearance: 30.2 mL/min (A) (by C-G formula based on SCr of 1.38 mg/dL (H)).     Antimicrobials this admission: Pip/tazo 4/16 >>  Cipro 4/14   Microbiology results: 4/15 BCx: pend  Thank you for allowing pharmacy to be a part of this patient's care.  Benetta Spar, PharmD, BCPS, BCCP Clinical Pharmacist  Please check AMION for all [place] phone numbers After 10:00 PM, call [place] [phone]

## 2019-11-07 NOTE — Procedures (Signed)
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 Recent ERCP and stent placement. Prior percutaneous drains.  Need to internalize drains and perform biopsy if possible.  Procedure:  1) Cholangiogram thru existing drain. 2) Attempt repositioning of existing left hepatic drain that required a new puncture due lost access. 3) Placement of new left intrahepatic drain. 4) Removal of the dislodged right hepatic drain.     Findings: 1) ERCP placed stent extends into left hepatic ducts - was not draining from the existing left drain.  2)  Lesion is visible based on narrowing in the metallic stent.  Could not cannulate the central biliary system from left ducts. 3)  Cannulated additional intrahepatic left ducts and placed new external drain with additional side-holes.  4) Small amount of perihepatic ascites around the liver. 5) Small amount of contrast extravasation associated with new drain placement.  Complications: None     EBL: less than 20 ml  Plan: Recommend admitting the patient due to difficult procedure, elevated WBC and risk of sepsis.  Discussed with Dr. Watt Climes and will need to try and place another right hepatic drain, hopefully during this hospitalization.     Hailey Lauderback R. Anselm Pancoast, MD  Pager: [phone]

## 2019-11-07 NOTE — Discharge Instructions (Addendum)
Biliary Drainage Catheter Placement, Care After This sheet gives you information about how to care for yourself after your procedure. Your health care provider may also give you more specific instructions. If you have problems or questions, contact your health care provider. What can I expect after the procedure? After the procedure, it is common to have:  Pain or soreness at the catheter insertion site.  Tiredness and sleepiness for several hours.  Some bruising at the catheter insertion site.  Drainage into the collection bag on the outside of your body, if you have an external drainage catheter. ? You might see bloody discharge in the bag for the first 1 or 2 days. ? Then, the discharge should turn a yellow-green color. Follow these instructions at home: Medicines   Take over-the-counter and prescription medicines for pain, discomfort, or fever only as told by your health care provider.  Do not take aspirin or blood thinners unless your health care provider says that you can. These can make bleeding worse.  Do not drive or use heavy machinery while taking prescription pain medicine. Catheter insertion site care  Clean the catheter insertion site as told by your health care provider.  Do not take baths, swim, or use a hot tub until your health care provider approves.  Take showers only. Before showering, cover the catheter insertion area with a watertight covering to keep the area dry.  Keep the skin around the catheter insertion site dry. If the area gets wet, dry the skin completely.  Check your catheter insertion site every day for signs of infection. Check for: ? Redness, swelling, or pain. ? Fluid or blood. ? Warmth. ? Pus or a bad smell. General instructions   Rest for the remainder of the day.  Do not drive, use machinery, or make legal decisions for 24 hours after your procedure.  Resume your usual diet. Avoid alcoholic beverages for 24 hours after your  procedure.  Keep all follow-up visits as told by your health care provider. This is important.  Drink enough fluid to keep your urine clear or pale yellow. Contact a health care provider if:  Your pain gets worse after it had improved, and it is not relieved with pain medicines.  You have any questions about caring for your drainage catheter or collection bag.  You have any of these around your catheter insertion site or coming from it: ? Skin breakdown. ? Redness, swelling, or pain. ? Fluid or blood. ? Warmth to the touch. ? Pus or a bad smell. Get help right away if:  You have a fever or chills.  Your redness, swelling, or pain at the catheter insertion site gets worse, even though you are cleaning it well.  You have leakage of bile around the drainage catheter.  Your drainage catheter becomes blocked or clogged.  Your drainage catheter comes out. This information is not intended to replace advice given to you by your health care provider. Make sure you discuss any questions you have with your health care provider. Document Revised: 06/23/2017 Document Reviewed: 05/30/2016 Elsevier Patient Education  [place]. Moderate Conscious Sedation, Adult Sedation is the use of medicines to promote relaxation and relieve discomfort and anxiety. Moderate conscious sedation is a type of sedation. Under moderate conscious sedation, you are less alert than normal, but you are still able to respond to instructions, touch, or both. Moderate conscious sedation is used during short medical and dental procedures. It is milder than deep sedation, which is a  type of sedation under which you cannot be easily woken up. It is also milder than general anesthesia, which is the use of medicines to make you unconscious. Moderate conscious sedation allows you to return to your regular activities sooner. Tell a health care provider about:  Any allergies you have.  All medicines you are taking,  including vitamins, herbs, eye drops, creams, and over-the-counter medicines.  Use of steroids (by mouth or creams).  Any problems you or family members have had with sedatives and anesthetic medicines.  Any blood disorders you have.  Any surgeries you have had.  Any medical conditions you have, such as sleep apnea.  Whether you are pregnant or may be pregnant.  Any use of cigarettes, alcohol, marijuana, or street drugs. What are the risks? Generally, this is a safe procedure. However, problems may occur, including:  Getting too much medicine (oversedation).  Nausea.  Allergic reaction to medicines.  Trouble breathing. If this happens, a breathing tube may be used to help with breathing. It will be removed when you are awake and breathing on your own.  Heart trouble.  Lung trouble. What happens before the procedure? Staying hydrated Follow instructions from your health care provider about hydration, which may include:  Up to 2 hours before the procedure - you may continue to drink clear liquids, such as water, clear fruit juice, black coffee, and plain tea. Eating and drinking restrictions Follow instructions from your health care provider about eating and drinking, which may include:  8 hours before the procedure - stop eating heavy meals or foods such as meat, fried foods, or fatty foods.  6 hours before the procedure - stop eating light meals or foods, such as toast or cereal.  6 hours before the procedure - stop drinking milk or drinks that contain milk.  2 hours before the procedure - stop drinking clear liquids. Medicine Ask your health care provider about:  Changing or stopping your regular medicines. This is especially important if you are taking diabetes medicines or blood thinners.  Taking medicines such as aspirin and ibuprofen. These medicines can thin your blood. Do not take these medicines before your procedure if your health care provider instructs you  not to.  Tests and exams  You will have a physical exam.  You may have blood tests done to show: ? How well your kidneys and liver are working. ? How well your blood can clot. General instructions  Plan to have someone take you home from the hospital or clinic.  If you will be going home right after the procedure, plan to have someone with you for 24 hours. What happens during the procedure?  An IV tube will be inserted into one of your veins.  Medicine to help you relax (sedative) will be given through the IV tube.  The medical or dental procedure will be performed. What happens after the procedure?  Your blood pressure, heart rate, breathing rate, and blood oxygen level will be monitored often until the medicines you were given have worn off.  Do not drive for 24 hours. This information is not intended to replace advice given to you by your health care provider. Make sure you discuss any questions you have with your health care provider. Document Revised: 06/23/2017 Document Reviewed: 10/31/2015 Elsevier Patient Education  2020 [employer].

## 2019-11-07 NOTE — H&P (Signed)
History and Physical    Truly Wadhams [id number] DOB: 06-06-1941 DOA: 11/07/2019  PCP: Prince Solian, MD  Patient coming from: Home I have personally briefly reviewed patient's old medical records in [place]  Chief Complaint: Biliary obstruction  HPI: Hailey [name] is a 79 y.o. 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  Presents to IR today for evaluation of her external biliary drains as well as ongoing attempts of internalization and possible brush biopsies.  She underwent attempt of internal biliary drain placement 10/18/19, however ultimately left with right-sided external biliary drain placement due to inability to pass the obstruction.  She returned 10/24/19 for reattempt to pass the obstruction, however this was unsuccessful and a new left-sided external biliary drain was placed.  In the interim she has also undergone several attempts at ERCP to pass the stricture; s/p ERCP 08/27/19, 10/15/19, and repeat yesterday 11/06/19 by GI Dr. Watt Climes.   Patient underwent cholangiogram through existing drain, attempts repositioning of existing left hepatic drain that required a new puncture due to lost access, placement of new left intrahepatic drain and removal of dislodged right hepatic drain.  Patient's white count elevated however she remains afebrile.  She received IV Zosyn during the procedure.  IR requested to admit the patient due to complicated procedure and elevated white count.  During encounter: Patient lying comfortably on the bed.  Reports that she is doing fine, denies any pain, fever, chills, nausea, vomiting, diarrhea, headache, blurry vision, chest pain, shortness of breath, urinary or bowel changes.  She has chronic leg swelling and she takes Lasix as needed at home.  No history of CHF, orthopnea or PND.  She lives alone.  No history of  smoking, alcohol, is a drug use.  Review of Systems: As per HPI otherwise negative.    Past Medical History:  Diagnosis Date  . 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 Bilateral leg edema    takes lasix as needed for dependent edema   . H/O exercise stress test    4-5 yrs. ago, told that it was wnl , done at [place] office   . Headache    MH: Migraines  . Hypertension   . Osteoporosis   . Parkinson disease ([place])   . Wears glasses     Past Surgical History:  Procedure Laterality Date  . BILIARY BRUSHING  08/27/2019   Procedure: BILIARY BRUSHING;  Surgeon: Clarene Essex, MD;  Location: [facility];  Service: Endoscopy;;  . BILIARY DILATION  11/06/2019   Procedure: BILIARY DILATION;  Surgeon: Clarene Essex, MD;  Location: [facility];  Service: Endoscopy;;  . BILIARY STENT PLACEMENT N/A 11/06/2019   Procedure: BILIARY STENT PLACEMENT;  Surgeon: Clarene Essex, MD;  Location: [facility];  Service: Endoscopy;  Laterality: N/A;  . BIOPSY  08/27/2019   Procedure: BIOPSY;  Surgeon: Clarene Essex, MD;  Location: [facility];  Service: Endoscopy;;  . BREAST CYST EXCISION Left 1994   benign  . cataract surgery Bilateral 2007  . ERCP N/A 08/27/2019   Procedure: ENDOSCOPIC RETROGRADE CHOLANGIOPANCREATOGRAPHY (ERCP);  Surgeon: Clarene Essex, MD;  Location: [facility];  Service: Endoscopy;  Laterality: N/A;  balloon sweep  . ERCP N/A 10/15/2019   Procedure: ENDOSCOPIC RETROGRADE CHOLANGIOPANCREATOGRAPHY (ERCP);  Surgeon: Clarene Essex, MD;  Location: [facility];  Service: Endoscopy;  Laterality: N/A;  . ERCP N/A 11/06/2019   Procedure: ENDOSCOPIC RETROGRADE CHOLANGIOPANCREATOGRAPHY (ERCP);  Surgeon: Watt Climes,  Altamese Dilling, MD;  Location: [facility];  Service: Endoscopy;  Laterality: N/A;  . EYE SURGERY     /w IOL  . FOOT TUMOR EXCISION Left 1992   benign  . IR BILIARY DRAIN PLACEMENT WITH CHOLANGIOGRAM  10/18/2019  . IR BILIARY DRAIN PLACEMENT WITH CHOLANGIOGRAM  10/24/2019  . IR EXCHANGE BILIARY DRAIN   10/24/2019  . MOUTH SURGERY    . REVERSE SHOULDER ARTHROPLASTY Right 12/26/2013   Procedure: RIGHT SHOULDER REVERSE ARTHROPLASTY;  Surgeon: Marin Shutter, MD;  Location: [facility];  Service: Orthopedics;  Laterality: Right;  . SPHINCTEROTOMY  10/15/2019   Procedure: SPHINCTEROTOMY;  Surgeon: Clarene Essex, MD;  Location: [facility];  Service: Endoscopy;;  . Bess Kinds CHOLANGIOSCOPY N/A 08/27/2019   Procedure: [id number] CHOLANGIOSCOPY;  Surgeon: Clarene Essex, MD;  Location: [facility];  Service: Endoscopy;  Laterality: N/A;  . SPYGLASS CHOLANGIOSCOPY N/A 10/15/2019   Procedure: [id number] CHOLANGIOSCOPY;  Surgeon: Clarene Essex, MD;  Location: [facility];  Service: Endoscopy;  Laterality: N/A;  . TOTAL KNEE ARTHROPLASTY Right 04/09/2018   Procedure: RIGHT TOTAL KNEE ARTHROPLASTY;  Surgeon: Gaynelle Arabian, MD;  Location: [facility];  Service: Orthopedics;  Laterality: Right;  Adductor Block     reports that she quit smoking about 50 years ago. Her smoking use included cigarettes. She quit after 10.00 years of use. She has never used smokeless tobacco. She reports that she does not drink alcohol or use drugs.  Allergies  Allergen Reactions  . Forteo [Parathyroid Hormone (Recomb)] Other (See Comments)    " strange feeling and I cannot describe how I felt but the Dr took me off of it"  . Merthiolate [Thimerosal] Other (See Comments)    Tincture Merthiolate-skin blistering    Family History  Problem Relation Age of Onset  . Colon cancer Mother   . Heart Problems Mother   . Heart Problems Father   . Heart Problems Brother   . Breast cancer Cousin   . Breast cancer Cousin   . Breast cancer Cousin   . Breast cancer Cousin   . Breast cancer Cousin     Prior to Admission medications   Medication Sig Start Date End Date Taking? Authorizing Provider  aspirin EC 81 MG tablet Take 81 mg by mouth daily.   Yes [provider]  Calcium Carbonate-Vitamin D (CALTRATE 600+D) 600-400 MG-UNIT per tablet Take  1 tablet by mouth daily.   Yes [provider]  carbidopa-levodopa (SINEMET IR) 25-100 MG tablet TAKE 1 TABLET BY MOUTH THREE TIMES DAILY, TAKE 4 HOURS APART Patient taking differently: Take 1 tablet by mouth 3 (three) times daily. 0800, 1200, 1600 05/14/19  Yes Lomax, Amy, NP  furosemide (LASIX) 20 MG tablet Take 20 mg by mouth daily.    Yes [provider]  metoprolol succinate (TOPROL-XL) 100 MG 24 hr tablet Take 100 mg by mouth daily. Take with or immediately following a meal.   Yes [provider]  Polyethyl Glycol-Propyl Glycol (SYSTANE OP) Place 1 drop into both eyes daily as needed (dry eyes).   Yes [provider]  vitamin C (ASCORBIC ACID) 250 MG tablet Take 500 mg by mouth daily.   Yes [provider]  Vitamin D, Ergocalciferol, (DRISDOL) 50000 units CAPS capsule Take 50,000 Units by mouth 2 (two) times a week. Tuesdays and Fridays   Yes [provider]  amoxicillin (AMOXIL) 500 MG capsule Take 2,000 mg by mouth See admin instructions. Take 4 capsules (2000 mg) by mouth one hour prior to dental  appointment 07/31/15   [provider]  guaiFENesin (MUCINEX) 600 MG 12 hr tablet Take 600 mg by mouth 2 (two) times daily as needed (congestion).    [provider]  oxycodone (OXY-IR) 5 MG capsule Take 1 capsule (5 mg total) by mouth every 8 (eight) hours as needed. 11/06/19   Virgel Manifold, MD    Physical Exam: Vitals:   11/07/19 1330 11/07/19 1345 11/07/19 1400 11/07/19 1456  BP: (!) 107/52 (!) 114/54 (!) 112/53 (!) 115/48  Pulse: 66 66 70 72  Resp: 20 20 20    Temp:    99.9 F (37.7 C)  TempSrc:    Oral  SpO2: 100% 100% 100% 100%  Weight:      Height:        Constitutional: NAD, calm, comfortable, on room air, communicating well Eyes: PERRL, lids and conjunctivae: Icteric  ENMT: Mucous membranes are moist. Posterior pharynx clear of any exudate or lesions.Normal dentition.  Neck: normal, supple, no masses, no  thyromegaly Respiratory: clear to auscultation bilaterally, no wheezing, no crackles. Normal respiratory effort. No accessory muscle use.  Cardiovascular: Regular rate and rhythm, no murmurs / rubs / gallops. No extremity edema. 2+ pedal pulses. No carotid bruits.  Abdomen: Biliary drain noted.  No tenderness on palpation.  Bowel sounds positive.   Musculoskeletal: no clubbing / cyanosis. No joint deformity upper and lower extremities. Good ROM, no contractures. Normal muscle tone.  Skin: Jaundiced skin Neurologic: CN 2-12 grossly intact. Sensation intact, DTR normal. Strength 5/5 in all 4.  Psychiatric: Normal judgment and insight. Alert and oriented x 3. Normal mood.    Labs on Admission: I have personally reviewed following labs and imaging studies  CBC: Recent Labs  Lab 11/06/19 0248 11/07/19 0803  WBC 10.9* 19.0*  NEUTROABS  --  17.0*  HGB 12.0 10.8*  HCT 36.3 32.7*  MCV 99.5 98.2  PLT 421* [id number]   Basic Metabolic Panel: Recent Labs  Lab 11/06/19 0248 11/07/19 0803  NA 136 134*  K 4.1 5.1  CL 105 103  CO2 18* 19*  GLUCOSE 145* 96  BUN 39* 34*  CREATININE 1.45* 1.38*  CALCIUM 9.2 8.9   GFR: Estimated Creatinine Clearance: 30.2 mL/min (A) (by C-G formula based on SCr of 1.38 mg/dL (H)). Liver Function Tests: Recent Labs  Lab 11/06/19 0248 11/07/19 0803  AST 67* 44*  ALT 19 15  ALKPHOS 235* 174*  BILITOT 12.3* 13.7*  PROT 6.3* 5.6*  ALBUMIN 2.7* 2.3*   Recent Labs  Lab 11/06/19 0248  LIPASE 30   No results for input(s): AMMONIA in the last 168 hours. Coagulation Profile: No results for input(s): INR, PROTIME in the last 168 hours. Cardiac Enzymes: No results for input(s): CKTOTAL, CKMB, CKMBINDEX, TROPONINI in the last 168 hours. BNP (last 3 results) No results for input(s): PROBNP in the last 8760 hours. HbA1C: No results for input(s): HGBA1C in the last 72 hours. CBG: No results for input(s): GLUCAP in the last 168 hours. Lipid Profile: No  results for input(s): CHOL, HDL, LDLCALC, TRIG, CHOLHDL, LDLDIRECT in the last 72 hours. Thyroid Function Tests: No results for input(s): TSH, T4TOTAL, FREET4, T3FREE, THYROIDAB in the last 72 hours. Anemia Panel: No results for input(s): VITAMINB12, FOLATE, FERRITIN, TIBC, IRON, RETICCTPCT in the last 72 hours. Urine analysis:    Component Value Date/Time   COLORURINE AMBER (A) 11/06/2019 0615   APPEARANCEUR HAZY (A) 11/06/2019 0615   LABSPEC 1.020 11/06/2019 0615   PHURINE 5.0 11/06/2019 0615  GLUCOSEU NEGATIVE 11/06/2019 0615   HGBUR NEGATIVE 11/06/2019 0615   BILIRUBINUR SMALL (A) 11/06/2019 0615   KETONESUR NEGATIVE 11/06/2019 0615   PROTEINUR 30 (A) 11/06/2019 0615   UROBILINOGEN 0.2 10/24/2014 0715   NITRITE NEGATIVE 11/06/2019 0615   LEUKOCYTESUR NEGATIVE 11/06/2019 0615    Radiological Exams on Admission: CT ABDOMEN PELVIS W CONTRAST  Result Date: 11/06/2019 CLINICAL DATA:  Right lower quadrant pain and jaundice. EXAM: CT ABDOMEN AND PELVIS WITH CONTRAST TECHNIQUE: Multidetector CT imaging of the abdomen and pelvis was performed using the standard protocol following bolus administration of intravenous contrast. CONTRAST:  52mL OMNIPAQUE IOHEXOL 300 MG/ML  SOLN COMPARISON:  10/09/2019 liver MRI FINDINGS: Lower chest:  Mild atelectasis or scarring in the lower lungs. Hepatobiliary: Amorphouscentral hepatic mass poorly defined on this study, roughly 2.9 cm on coronal reformats. There is intrahepatic bile duct obstruction with left and right biliary drains. The right-sided drain has been retracted into the abdominal wall. The left-sided drain is in position over the liver, although there is still prominent left-sided ductal dilatation at the level of segment to. 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 Spleen: Unremarkable. Adrenals/Urinary Tract: Negative adrenals. No hydronephrosis  or stone. Unremarkable bladder. Stomach/Bowel: No obstruction. No appendicitis. Numerous left colonic diverticula. Vascular/Lymphatic: No acute vascular abnormality. Atherosclerotic calcification of the aorta. No mass or adenopathy. Reproductive:No pathologic findings. Other: No ascites or pneumoperitoneum. Shallow bilateral groin hernia with fluid density on the right. Musculoskeletal: No acute abnormalities. L5-S1 advanced facet osteoarthritis with anterolisthesis and accelerated disc narrowing. IMPRESSION: Hepatic hilar mass with biliary obstruction and percutaneous drainage catheters. The right-sided drain has been retracted into the abdominal wall. The left-sided drain is in place although there is still prominent dilation of segment 2 ducts. Electronically Signed   By: Monte Fantasia M.D.   On: 11/06/2019 07:31   DG ERCP BILIARY & PANCREATIC DUCTS  Result Date: 11/07/2019 CLINICAL DATA:  79 year old female with biliary obstruction EXAM: ERCP TECHNIQUE: Multiple spot images obtained with the fluoroscopic device and submitted for interpretation post-procedure. FLUOROSCOPY TIME:  Fluoroscopy Time:  26 minutes 44 seconds COMPARISON:  CT 11/06/2019, fluoro 10/18/2019, 10/24/2019 FINDINGS: Limited intraoperative fluoroscopic spot images during ERCP. Initial image demonstrates the endoscope projecting over the upper abdomen with safety wire in the common bile duct. Partial opacification of the extrahepatic biliary ducts. Subsequently there is deployment of both a retrieval balloon as well as a metallic biliary stent at the site of stricture/occlusion. IMPRESSION: Limited intraoperative fluoroscopic spot images demonstrate treatment of biliary obstruction with deployment of both retrieval balloon as well as metallic biliary stent. Please refer to the dictated operative report for full details of intraoperative findings and procedure. Electronically Signed   By: Corrie Mckusick D.O.   On: 11/07/2019 07:30     Assessment/Plan Principal Problem:   Biliary obstruction Active Problems:   Parkinson's disease (HCC)   Hypertension   AKI (acute kidney injury) ([place])   Hyponatremia   Leukocytosis   Normocytic anemia   Bilateral leg edema    Biliary obstruction: -?  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 -Patient is afebrile with leukocytosis of 19,000.   -Admitting patient on the floor -She received IV Zosyn during the procedure. -We will get blood culture and lactic acid.  Continue IV Zosyn. -NPO.  Start on gentle hydration.  Dilaudid as needed for pain control.  Zofran as needed for nausea and vomiting. -GI- Dr. Richardson Landry try placement of another right hepatic drain likely during this hospitalization.  AKI: -Creatinine: 1.38, GFR: 37.  Baseline creatinine: 0.90. -Start with gentle hydration.  Hold Lasix for now.  Repeat BMP tomorrow a.m.  Chronic bilateral lower leg swelling: -Patient takes Lasix 20 mg at home.  Hold for now.   -Leg elevation.  Resume Lasix once patient's kidney function is back to baseline.  Hyponatremia: Sodium of 134. -Repeat BMP tomorrow a.m.  Parkinson disease: -Continue Sinemet  Hypertension: Stable continue metoprolol  Normocytic anemia: H&H: 10.8/32.7. -Check iron studies.  Monitor H&H.  DVT prophylaxis: TED/SCD Code Status: Full code-confirmed with the patient Family Communication: None present at bedside.  Plan of care discussed with patient in length and she verbalized understanding and agreed with it. Disposition Plan: Likely home in 1 to 2 days Consults called: IR & GI is on board Admission status: Inpatient   Mckinley Jewel MD [facility] Pager [phone]  If 7PM-7AM, please contact night-coverage [email or web address] Password [facility]  11/07/2019, 3:01 PM

## 2019-11-08 ENCOUNTER — Inpatient Hospital Stay (HOSPITAL_COMMUNITY): Payer: Medicare Other

## 2019-11-08 DIAGNOSIS — N179 Acute kidney failure, unspecified: Secondary | ICD-10-CM

## 2019-11-08 DIAGNOSIS — R600 Localized edema: Secondary | ICD-10-CM

## 2019-11-08 DIAGNOSIS — I10 Essential (primary) hypertension: Secondary | ICD-10-CM

## 2019-11-08 DIAGNOSIS — R160 Hepatomegaly, not elsewhere classified: Secondary | ICD-10-CM

## 2019-11-08 DIAGNOSIS — G20 Parkinson's disease: Secondary | ICD-10-CM

## 2019-11-08 DIAGNOSIS — R17 Unspecified jaundice: Secondary | ICD-10-CM

## 2019-11-08 DIAGNOSIS — E871 Hypo-osmolality and hyponatremia: Secondary | ICD-10-CM

## 2019-11-08 DIAGNOSIS — K831 Obstruction of bile duct: Secondary | ICD-10-CM | POA: Diagnosis not present

## 2019-11-08 HISTORY — PX: IR BILIARY DRAIN PLACEMENT WITH CHOLANGIOGRAM: IMG6043

## 2019-11-08 HISTORY — PX: IR GUIDED DRAIN W CATHETER PLACEMENT: IMG719

## 2019-11-08 HISTORY — PX: IR EXCHANGE BILIARY DRAIN: IMG6046

## 2019-11-08 LAB — COMPREHENSIVE METABOLIC PANEL
ALT: 7 U/L (ref 0–44)
AST: 42 U/L — ABNORMAL HIGH (ref 15–41)
Albumin: 1.9 g/dL — ABNORMAL LOW (ref 3.5–5.0)
Alkaline Phosphatase: 147 U/L — ABNORMAL HIGH (ref 38–126)
Anion gap: 8 (ref 5–15)
BUN: 32 mg/dL — ABNORMAL HIGH (ref 8–23)
CO2: 21 mmol/L — ABNORMAL LOW (ref 22–32)
Calcium: 8.5 mg/dL — ABNORMAL LOW (ref 8.9–10.3)
Chloride: 107 mmol/L (ref 98–111)
Creatinine, Ser: 1.34 mg/dL — ABNORMAL HIGH (ref 0.44–1.00)
GFR calc Af Amer: 44 mL/min — ABNORMAL LOW (ref >60)
GFR calc non Af Amer: 38 mL/min — ABNORMAL LOW (ref >60)
Glucose, Bld: 84 mg/dL (ref 70–99)
Potassium: 4.7 mmol/L (ref 3.5–5.1)
Sodium: 136 mmol/L (ref 135–145)
Total Bilirubin: 11.6 mg/dL — ABNORMAL HIGH (ref 0.3–1.2)
Total Protein: 4.9 g/dL — ABNORMAL LOW (ref 6.5–8.1)

## 2019-11-08 LAB — CBC WITH DIFFERENTIAL/PLATELET
Abs Immature Granulocytes: 0.08 10*3/uL — ABNORMAL HIGH (ref 0.00–0.07)
Abs Immature Granulocytes: 0.04 10*3/uL (ref 0.00–0.07)
Basophils Absolute: 0 10*3/uL (ref 0.0–0.1)
Basophils Absolute: 0 10*3/uL (ref 0.0–0.1)
Basophils Relative: 0 %
Basophils Relative: 0 %
Eosinophils Absolute: 0.1 10*3/uL (ref 0.0–0.5)
Eosinophils Absolute: 0.1 10*3/uL (ref 0.0–0.5)
Eosinophils Relative: 0 %
Eosinophils Relative: 1 %
HCT: 29.5 % — ABNORMAL LOW (ref 36.0–46.0)
HCT: 32 % — ABNORMAL LOW (ref 36.0–46.0)
Hemoglobin: 9.6 g/dL — ABNORMAL LOW (ref 12.0–15.0)
Hemoglobin: 10.5 g/dL — ABNORMAL LOW (ref 12.0–15.0)
Immature Granulocytes: 1 %
Immature Granulocytes: 0 %
Lymphocytes Relative: 4 %
Lymphocytes Relative: 4 %
Lymphs Abs: 0.4 10*3/uL — ABNORMAL LOW (ref 0.7–4.0)
Lymphs Abs: 0.3 10*3/uL — ABNORMAL LOW (ref 0.7–4.0)
MCH: 31.9 pg (ref 26.0–34.0)
MCH: 32.4 pg (ref 26.0–34.0)
MCHC: 32.5 g/dL (ref 30.0–36.0)
MCHC: 32.8 g/dL (ref 30.0–36.0)
MCV: 98 fL (ref 80.0–100.0)
MCV: 98.8 fL (ref 80.0–100.0)
Monocytes Absolute: 1 10*3/uL (ref 0.1–1.0)
Monocytes Absolute: 0.6 10*3/uL (ref 0.1–1.0)
Monocytes Relative: 9 %
Monocytes Relative: 7 %
Neutro Abs: 10.1 10*3/uL — ABNORMAL HIGH (ref 1.7–7.7)
Neutro Abs: 8.1 10*3/uL — ABNORMAL HIGH (ref 1.7–7.7)
Neutrophils Relative %: 86 %
Neutrophils Relative %: 88 %
Platelets: 287 10*3/uL (ref 150–400)
Platelets: 324 10*3/uL (ref 150–400)
RBC: 3.01 MIL/uL — ABNORMAL LOW (ref 3.87–5.11)
RBC: 3.24 MIL/uL — ABNORMAL LOW (ref 3.87–5.11)
RDW: 17.2 % — ABNORMAL HIGH (ref 11.5–15.5)
RDW: 17.1 % — ABNORMAL HIGH (ref 11.5–15.5)
WBC: 11.7 10*3/uL — ABNORMAL HIGH (ref 4.0–10.5)
WBC: 9.2 10*3/uL (ref 4.0–10.5)
nRBC: 0 % (ref 0.0–0.2)
nRBC: 0 % (ref 0.0–0.2)

## 2019-11-08 LAB — IRON AND TIBC
Iron: 38 ug/dL (ref 28–170)
Saturation Ratios: 16 % (ref 10.4–31.8)
TIBC: 244 ug/dL — ABNORMAL LOW (ref 250–450)
UIBC: 206 ug/dL

## 2019-11-08 LAB — PROCALCITONIN: Procalcitonin: 0.8 ng/mL

## 2019-11-08 LAB — FERRITIN: Ferritin: 284 ng/mL (ref 11–307)

## 2019-11-08 MED ORDER — MIDAZOLAM HCL 2 MG/2ML IJ SOLN
INTRAMUSCULAR | Status: AC
  Filled 2019-11-08: qty 2

## 2019-11-08 MED ORDER — SODIUM CHLORIDE 0.9% FLUSH
5.0000 mL | Freq: Three times a day (TID) | INTRAVENOUS | Status: DC
  Administered 2019-11-08 – 2019-11-18 (×21): 5 mL

## 2019-11-08 MED ORDER — FENTANYL CITRATE (PF) 100 MCG/2ML IJ SOLN
INTRAMUSCULAR | Status: AC | PRN
  Administered 2019-11-08 (×8): 25 ug via INTRAVENOUS

## 2019-11-08 MED ORDER — MIDAZOLAM HCL 2 MG/2ML IJ SOLN
INTRAMUSCULAR | Status: AC | PRN
  Administered 2019-11-08 (×5): 0.5 mg via INTRAVENOUS
  Administered 2019-11-08: 1 mg via INTRAVENOUS
  Administered 2019-11-08: 0.5 mg via INTRAVENOUS

## 2019-11-08 MED ORDER — FENTANYL CITRATE (PF) 100 MCG/2ML IJ SOLN
INTRAMUSCULAR | Status: AC
  Filled 2019-11-08: qty 2

## 2019-11-08 MED ORDER — LIDOCAINE HCL 1 % IJ SOLN
INTRAMUSCULAR | Status: AC
  Filled 2019-11-08: qty 20

## 2019-11-08 MED ORDER — IOHEXOL 300 MG/ML  SOLN
100.0000 mL | Freq: Once | INTRAMUSCULAR | Status: AC | PRN
  Administered 2019-11-08: 35 mL

## 2019-11-08 NOTE — Procedures (Signed)
Interventional Radiology Procedure Note  Procedure:    Exchange of left biliary drain for new 8.32F drain.  (white midline to gravity) New right external biliary drain placement, with 74F drain.  (blue angiodynamics/exodus, to gravity) New peritoneal drain for biloma, 74F.   (blue BS/flexima, to gravity)   Attempt at connection of the left biliary system through the existing biliary stent with gunsight technique, without success.    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 This is causing peritonitis/TTP, and we placed a 74F biloma drain for relief.  - The right and left ducts do not communicate.    Complications: None  Recommendations:  - 3 drains to gravity - routine drain care for all 3, with routine flush and record output - Do not submerge   Signed,  Dulcy Fanny. Earleen Newport, DO

## 2019-11-08 NOTE — Progress Notes (Signed)
PROGRESS NOTE    Hailey [name]  [id number] DOB: 02/03/1941 DOA: 11/07/2019 PCP: Prince Solian, MD     Brief Narrative:  Hailey [name] is a 79 y.o. 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    Presents to IR today for evaluation of her external biliary drains as well as ongoing attempts of internalization and possible brush biopsies.  She underwent attempt of internal biliary drain placement 10/18/19, however ultimately left with right-sided external biliary drain placement due to inability to pass the obstruction. She returned 10/24/19 for reattempt to pass the obstruction, however this was unsuccessful and a new left-sided external biliary drain was placed. In the interim she has also undergone several attempts at ERCP to pass the stricture; s/p ERCP 08/27/19, 10/15/19, and repeat yesterday 11/06/19 by GI Dr. Watt Climes.   Patient underwent cholangiogram through existing drain, attempts repositioning of existing left hepatic drain that required a new puncture due to lost access, placement of new left intrahepatic drain and removal of dislodged right hepatic drain.  Patient's white count elevated however she remains afebrile.  She received IV Zosyn during the procedure.  IR requested to admit the patient due to complicated procedure and elevated white count.  During encounter: Patient lying comfortably on the bed.  Reports that she is doing fine, denies any pain, fever, chills, nausea, vomiting, diarrhea, headache, blurry vision, chest pain, shortness of breath, urinary or bowel changes.  She has chronic leg swelling and she takes Lasix as needed at home.  No history of CHF, orthopnea or PND.  She lives alone.  No history of smoking, alcohol, is a drug use.  Review of Systems: As per HPI otherwise negative.     Subjective: A/O x4, negative S OB, negative CP, positive abdominal pain.  Per  patient negative history of CHF but history of HTN SBP 170s.  Sleepy but recalls someone mentioning carcinoma as the cause of obstruction in her biliary tract   Assessment & Plan:   Principal Problem:   Biliary obstruction Active Problems:   Parkinson's disease ([place])   Hypertension   AKI (acute kidney injury) ([place])   Hyponatremia   Leukocytosis   Normocytic anemia   Bilateral leg edema   Biliary obstruction: -?  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 -Patient is afebrile with leukocytosis of 19,000.   -Admitting patient on the floor -She received IV Zosyn during the procedure. -We will get blood culture and lactic acid.  Continue IV Zosyn. -NPO.  Start on gentle hydration.  Dilaudid as needed for pain control.  Zofran as needed for nausea and vomiting. -GI- Dr. Richardson Landry try placement of another right hepatic drain likely during this hospitalization.  AKI (baseline Cr 0.90)  -Creatinine: 1.38, GFR: 37.  Baseline creatinine: 0.90. -Start with gentle hydration.  Hold Lasix for now.  Repeat BMP tomorrow a.m.  Chronic bilateral lower leg swelling: -Patient takes Lasix 20 mg at home.  Hold for now.   -Leg elevation.  Resume Lasix once patient's kidney function is back to baseline.  Hyponatremia: Sodium of 134. -Repeat BMP tomorrow a.m.  Parkinson disease: -Continue Sinemet  Hypertension: Stable continue metoprolol  Normocytic anemia: H&H: 10.8/32.7. -Check iron studies.  Monitor H&H.   DVT prophylaxis: SCD Code Status: Full Family Communication:  Disposition Plan:  1.  Where the patient is from 2.  Anticipated d/c place. 3.  Barriers to d/c does patient have cholangiocarcinoma or not?  Does patient need cancer work-up or not?   Consultants:  IR DO. Liana Crocker    Procedures/Significant Events:  4/16 biliary  drain placement x3;Exchange of left biliary drain for new 8.22F drain.  (white midline to gravity) New right external biliary drain placement, with 62F drain.  (blue angiodynamics/exodus, to gravity) New peritoneal drain for biloma, 62F.   (blue BS/flexima, to gravity)   Attempt at connection of the left biliary system through the existing biliary stent with gunsight technique, without success.    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 This is causing peritonitis/TTP, and we placed a 62F biloma drain for relief.  - The right and left ducts do not communicate.    Complications: None  Recommendations:  - 3 drains to gravity - routine drain care for all 3, with routine flush and record output - Do not submerge    I have personally reviewed and interpreted all radiology studies and my findings are as above.  VENTILATOR SETTINGS:    Cultures   Antimicrobials: Anti-infectives (From admission, onward)   Start     Dose/Rate Stop   11/07/19 1800  piperacillin-tazobactam (ZOSYN) IVPB 3.375 g     3.375 g 12.5 mL/hr over 240 Minutes     11/07/19 0815  piperacillin-tazobactam (ZOSYN) IVPB 3.375 g     3.375 g 12.5 mL/hr over 240 Minutes 11/07/19 1617       Devices    LINES / TUBES:      Continuous Infusions: . sodium chloride 75 mL/hr at 11/08/19 0256  . piperacillin-tazobactam (ZOSYN)  IV 3.375 g (11/08/19 0250)     Objective: Vitals:   11/07/19 1456 11/07/19 2100 11/07/19 2204 11/08/19 0421  BP: (!) 115/48 121/61 (!) 121/51 118/62  Pulse: 72 71 75 71  Resp: 18 17 18 17   Temp: 99.9 F (37.7 C) 97.9 F (36.6 C) (!) 97.5 F (36.4 C) 97.9 F (36.6 C)  TempSrc: Oral Oral Oral Oral  SpO2: 100% 99% 91% 94%  Weight:      Height:        Intake/Output Summary (Last 24 hours) at 11/08/2019 [id number] Last data filed at 11/07/2019 1518 Gross per 24 hour  Intake 340.83 ml  Output --  Net 340.83 ml   Filed Weights   11/07/19 0802  Weight: 66.7 kg     Examination:  General: A/O x4, no acute respiratory distress, cachectic Eyes: negative scleral hemorrhage, negative anisocoria, negative icterus ENT: Negative Runny nose, negative gingival bleeding, Neck:  Negative scars, masses, torticollis, lymphadenopathy, JVD Lungs: Clear to auscultation bilaterally without wheezes or crackles Cardiovascular: Regular rate and rhythm without murmur gallop or rub normal S1 and S2 Abdomen: negative abdominal pain, nondistended, positive soft, bowel sounds, no rebound, no ascites, no appreciable mass, 3x Biliary drains present (draining serosanguineous fluid) Extremities: No significant cyanosis, clubbing, or edema bilateral lower extremities Skin: Positive jaundiced Psychiatric:  Negative depression, negative anxiety, negative fatigue, negative mania  Central nervous system:  Cranial nerves II through XII intact, tongue/uvula midline, all extremities muscle strength 5/5, sensation intact throughout, finger nose finger bilateral within normal limits, quick finger touch bilateral within normal limits, negative Romberg sign, heel to shin bilateral within normal limits, standing on 1 foot bilateral within normal limits, walking on tiptoes within normal limits, walking on heels within normal limits, negative dysarthria, negative expressive aphasia, negative receptive aphasia.  .     Data Reviewed:  Care during the described time interval was provided by me .  I have reviewed this patient's available data, including medical history, events of note, physical examination, and all test results as part of my evaluation.  CBC: Recent Labs  Lab 11/06/19 0248 11/07/19 0803 11/08/19 0242  WBC 10.9* 19.0* 11.7*  NEUTROABS  --  17.0* 10.1*  HGB 12.0 10.8* 9.6*  HCT 36.3 32.7* 29.5*  MCV 99.5 98.2 98.0  PLT 421* 330 [id number]   Basic Metabolic Panel: Recent Labs  Lab 11/06/19 0248 11/07/19 0803 11/07/19 1433 11/08/19 0242  NA 136 134*  --  136  K 4.1 5.1  --   4.7  CL 105 103  --  107  CO2 18* 19*  --  21*  GLUCOSE 145* 96  --  84  BUN 39* 34*  --  32*  CREATININE 1.45* 1.38*  --  1.34*  CALCIUM 9.2 8.9  --  8.5*  MG  --   --  1.8  --    GFR: Estimated Creatinine Clearance: 31.1 mL/min (A) (by C-G formula based on SCr of 1.34 mg/dL (H)). Liver Function Tests: Recent Labs  Lab 11/06/19 0248 11/07/19 0803 11/08/19 0242  AST 67* 44* 42*  ALT 19 15 7   ALKPHOS 235* 174* 147*  BILITOT 12.3* 13.7* 11.6*  PROT 6.3* 5.6* 4.9*  ALBUMIN 2.7* 2.3* 1.9*   Recent Labs  Lab 11/06/19 0248  LIPASE 30   No results for input(s): AMMONIA in the last 168 hours. Coagulation Profile: Recent Labs  Lab 11/07/19 1433  INR 1.2   Cardiac Enzymes: No results for input(s): CKTOTAL, CKMB, CKMBINDEX, TROPONINI in the last 168 hours. BNP (last 3 results) No results for input(s): PROBNP in the last 8760 hours. HbA1C: No results for input(s): HGBA1C in the last 72 hours. CBG: No results for input(s): GLUCAP in the last 168 hours. Lipid Profile: No results for input(s): CHOL, HDL, LDLCALC, TRIG, CHOLHDL, LDLDIRECT in the last 72 hours. Thyroid Function Tests: No results for input(s): TSH, T4TOTAL, FREET4, T3FREE, THYROIDAB in the last 72 hours. Anemia Panel: Recent Labs    11/08/19 0242  FERRITIN 284  TIBC 244*  IRON 38   Sepsis Labs: Recent Labs  Lab 11/07/19 1433  LATICACIDVEN 0.8    Recent Results (from the past 240 hour(s))  SARS CORONAVIRUS 2 (TAT 6-24 HRS) Nasopharyngeal Nasopharyngeal Swab     Status: None   Collection Time: 11/02/19  1:28 PM   Specimen: Nasopharyngeal Swab  Result Value Ref Range Status   SARS Coronavirus 2 NEGATIVE NEGATIVE Final    Comment: (NOTE) SARS-CoV-2 target nucleic acids are NOT DETECTED. The SARS-CoV-2 RNA is generally detectable in upper and lower respiratory specimens during the acute phase of infection. Negative results do not preclude SARS-CoV-2 infection, do not rule out co-infections with  other pathogens, and should not be used as the sole basis for treatment or other patient management decisions. Negative results must be combined with clinical observations, patient history, and epidemiological information. The expected result is Negative. Fact Sheet for Patients: SugarRoll.be Fact Sheet for Healthcare Providers: https://www.-mathews.com/ This test is not yet approved or cleared by the [place] FDA and  has been authorized for detection and/or diagnosis of SARS-CoV-2 by FDA under an Emergency Use Authorization (EUA). This EUA will remain  in effect (meaning this test can be used) for the duration of the COVID-19 declaration under Section 56 4(b)(1) of the Act, 21 U.S.C. section 360bbb-3(b)(1), unless the authorization is terminated  or revoked sooner. Performed at [facility], [place] [address], [place], [place] [address]          Radiology Studies: DG ERCP BILIARY & PANCREATIC DUCTS  Result Date: 11/07/2019 CLINICAL DATA:  79 year old female with biliary obstruction EXAM: ERCP TECHNIQUE: Multiple spot images obtained with the fluoroscopic device and submitted for interpretation post-procedure. FLUOROSCOPY TIME:  Fluoroscopy Time:  26 minutes 44 seconds COMPARISON:  CT 11/06/2019, fluoro 10/18/2019, 10/24/2019 FINDINGS: Limited intraoperative fluoroscopic spot images during ERCP. Initial image demonstrates the endoscope projecting over the upper abdomen with safety wire in the common bile duct. Partial opacification of the extrahepatic biliary ducts. Subsequently there is deployment of both a retrieval balloon as well as a metallic biliary stent at the site of stricture/occlusion. IMPRESSION: Limited intraoperative fluoroscopic spot images demonstrate treatment of biliary obstruction with deployment of both retrieval balloon as well as metallic biliary stent. Please refer to the dictated operative report for full details  of intraoperative findings and procedure. Electronically Signed   By: Corrie Mckusick D.O.   On: 11/07/2019 07:30   IR CHOLANGIOGRAM EXISTING TUBE  Result Date: 11/07/2019 INDICATION: 79 year old with biliary obstruction and undergone multiple procedures for biliary decompression. The patient has had bilateral external biliary drains. Recent CT demonstrated that the right biliary drain was dislodged. Patient underwent ERCP and stent placement on 11/06/2019. Patient presents for repeat cholangiogram through existing catheter and attempt at internalization of a biliary drain. EXAM: 1. CHOLANGIOGRAM THROUGH EXISTING DRAIN 2. PERCUTANEOUS TRANSHEPATIC CHOLANGIOGRAM WITH ULTRASOUND AND FLUOROSCOPIC GUIDANCE AND PLACEMENT OF NEW LEFT BILIARY DRAIN 3. REMOVAL OF THE DISLODGED RIGHT BILIARY DRAIN MEDICATIONS: Zosyn 3.375 g; The antibiotic was administered within an appropriate time frame prior to the initiation of the procedure. ANESTHESIA/SEDATION: Moderate (conscious) sedation was employed during this procedure. A total of Versed 2.0 mg and Fentanyl 100 mcg was administered intravenously. Moderate Sedation Time: 90 minutes. The patient's level of consciousness and vital signs were monitored continuously by radiology nursing throughout the procedure under my direct supervision. FLUOROSCOPY TIME:  Fluoroscopy Time: 27 minutes, 42 seconds, [id number] mGy COMPLICATIONS: None immediate. PROCEDURE: Informed written consent was obtained from the patient after a thorough discussion of the procedural risks, benefits and alternatives. All questions were addressed. Maximal Sterile Barrier Technique was utilized including caps, mask, sterile gowns, sterile gloves, sterile drape, hand hygiene and skin antiseptic. A timeout was performed prior to the initiation of the procedure. Patient was placed supine on the interventional table. Both drains were prepped and draped in sterile fashion. Scout image of the abdomen was obtained. Left drain  was injected with contrast. The skin around the drain was anesthetized with 1% lidocaine. The retention suture was removed. The catheter was cut and removed over a Bentson wire. Five [place] Kumpe catheter was placed. Additional cholangiogram images were obtained. Eventually, a Roadrunner wire was advanced into branches along the right side of the metallic biliary stent. These branches were not opacifying during the initial contrast injection. A Glide catheter was advanced into these branches and demonstrated additional dilated bile ducts. These most likely represent medial left hepatic bile ducts. Attempted to access the central biliary ducts along the path of the stent. While trying to access the central biliary system, the percutaneous access was completely lost. Unfortunately, catheter would not go through the old puncture site. Therefore, ultrasound was used to identify the left hepatic lobe. A 21 gauge needle was directed into a dilated left biliary duct using ultrasound guidance. 0.018 wire was advanced into  the biliary system and transitional dilator set was placed. Contrast injection confirmed placement into the biliary system. Eventually, the 5 French catheter was again advanced into the left biliary ducts. Access was again obtained into the more medial left biliary ducts. At this point, the decision was made to proceed with another external biliary drain. Initially, a 59 [place] biliary drain was placed but the drain was too long and the side holes were outside of the liver. Therefore, a 10 [place] multipurpose drain was selected and additional side holes were placed. Unfortunately, too many side holes were placed and there was drainage outside of the liver. Therefore, another 10 [place] multipurpose drain was used and 2 additional side holes were placed. The drain was placed over a stiff Amplatz wire. Contrast injection confirmed that the drain was not leaking outside of the liver. However, there was some  extravasation near the tip of the catheter. Due to this area of extravasation, the catheter was slightly pulled back. Catheter was attached to a gravity bag. Catheter was sutured to skin. New bandage was placed. FINDINGS: Initial scout image demonstrates that a metallic biliary stent has been placed. Stent extends into the left hepatic ducts. 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 The right-sided biliary drain was still sutured to the skin but the tube was just underneath the skin, within the subcutaneous tissues and not within the liver. The right biliary drain was completely removed. Multiple cholangiogram images in the left hepatic lobe demonstrate an obstructing lesion in the region of the midportion of stent. Catheter and wire could not be advanced into the central bile ducts or common bile duct. However, catheter was advanced into the more medial left hepatic ducts. After access was obtained into the medial left hepatic ducts, there was some contrast flowing into the biliary stent and common bile duct. Common bile duct distal to the stent appears to be very tortuous. Large amount of retained contrast in the gallbladder from the recent ERCP procedure. External biliary drain was placed in left biliary ducts. Additional side holes were placed in order to adequately decompress the left biliary ducts. Small focus of contrast extravasation near the tip of the external biliary drain. As a result, the biliary drain was slightly pulled back. Ultrasound images during the procedure demonstrate a small amount of right perihepatic ascites. IMPRESSION: 1. Cholangiogram demonstrates persistent biliary obstruction with a recently placed metallic stent in the left hepatic lobe. Narrowing in the mid stent is compatible with the underlying lesion. Unable to cross the lesion or obstruction from the left percutaneous access. 2. Percutaneous access was lost in left  hepatic lobe while trying to cross the biliary obstruction. As a result, a new left percutaneous drain was placed. This is a left external biliary drain. 3. Right biliary drain was known to be dislodged and was completely removed. Small amount of perihepatic ascites noted along the right hepatic lobe. 4. Patient was admitted following the procedure due to the concern for infection after recent ERCP procedure and percutaneous procedure. Patient will need a new right percutaneous drain in order to decompress the right hepatic lobe. Electronically Signed   By: Markus Daft M.D.   On: 11/07/2019 17:19   IR BILIARY DRAIN PLACEMENT WITH CHOLANGIOGRAM  Result Date: 11/07/2019 INDICATION: 79 year old with biliary obstruction and undergone multiple procedures for biliary decompression. The patient has had bilateral external biliary drains. Recent CT demonstrated that the right biliary drain was dislodged. Patient  underwent ERCP and stent placement on 11/06/2019. Patient presents for repeat cholangiogram through existing catheter and attempt at internalization of a biliary drain. EXAM: 1. CHOLANGIOGRAM THROUGH EXISTING DRAIN 2. PERCUTANEOUS TRANSHEPATIC CHOLANGIOGRAM WITH ULTRASOUND AND FLUOROSCOPIC GUIDANCE AND PLACEMENT OF NEW LEFT BILIARY DRAIN 3. REMOVAL OF THE DISLODGED RIGHT BILIARY DRAIN MEDICATIONS: Zosyn 3.375 g; The antibiotic was administered within an appropriate time frame prior to the initiation of the procedure. ANESTHESIA/SEDATION: Moderate (conscious) sedation was employed during this procedure. A total of Versed 2.0 mg and Fentanyl 100 mcg was administered intravenously. Moderate Sedation Time: 90 minutes. The patient's level of consciousness and vital signs were monitored continuously by radiology nursing throughout the procedure under my direct supervision. FLUOROSCOPY TIME:  Fluoroscopy Time: 27 minutes, 42 seconds, [id number] mGy COMPLICATIONS: None immediate. PROCEDURE: Informed written consent was obtained  from the patient after a thorough discussion of the procedural risks, benefits and alternatives. All questions were addressed. Maximal Sterile Barrier Technique was utilized including caps, mask, sterile gowns, sterile gloves, sterile drape, hand hygiene and skin antiseptic. A timeout was performed prior to the initiation of the procedure. Patient was placed supine on the interventional table. Both drains were prepped and draped in sterile fashion. Scout image of the abdomen was obtained. Left drain was injected with contrast. The skin around the drain was anesthetized with 1% lidocaine. The retention suture was removed. The catheter was cut and removed over a Bentson wire. Five [place] Kumpe catheter was placed. Additional cholangiogram images were obtained. Eventually, a Roadrunner wire was advanced into branches along the right side of the metallic biliary stent. These branches were not opacifying during the initial contrast injection. A Glide catheter was advanced into these branches and demonstrated additional dilated bile ducts. These most likely represent medial left hepatic bile ducts. Attempted to access the central biliary ducts along the path of the stent. While trying to access the central biliary system, the percutaneous access was completely lost. Unfortunately, catheter would not go through the old puncture site. Therefore, ultrasound was used to identify the left hepatic lobe. A 21 gauge needle was directed into a dilated left biliary duct using ultrasound guidance. 0.018 wire was advanced into the biliary system and transitional dilator set was placed. Contrast injection confirmed placement into the biliary system. Eventually, the 5 French catheter was again advanced into the left biliary ducts. Access was again obtained into the more medial left biliary ducts. At this point, the decision was made to proceed with another external biliary drain. Initially, a 75 [place] biliary drain was placed but the  drain was too long and the side holes were outside of the liver. Therefore, a 10 [place] multipurpose drain was selected and additional side holes were placed. Unfortunately, too many side holes were placed and there was drainage outside of the liver. Therefore, another 10 [place] multipurpose drain was used and 2 additional side holes were placed. The drain was placed over a stiff Amplatz wire. Contrast injection confirmed that the drain was not leaking outside of the liver. However, there was some extravasation near the tip of the catheter. Due to this area of extravasation, the catheter was slightly pulled back. Catheter was attached to a gravity bag. Catheter was sutured to skin. New bandage was placed. FINDINGS: Initial scout image demonstrates that a metallic biliary stent has been placed. Stent extends into the left hepatic ducts. 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 The right-sided biliary drain was still  sutured to the skin but the tube was just underneath the skin, within the subcutaneous tissues and not within the liver. The right biliary drain was completely removed. Multiple cholangiogram images in the left hepatic lobe demonstrate an obstructing lesion in the region of the midportion of stent. Catheter and wire could not be advanced into the central bile ducts or common bile duct. However, catheter was advanced into the more medial left hepatic ducts. After access was obtained into the medial left hepatic ducts, there was some contrast flowing into the biliary stent and common bile duct. Common bile duct distal to the stent appears to be very tortuous. Large amount of retained contrast in the gallbladder from the recent ERCP procedure. External biliary drain was placed in left biliary ducts. Additional side holes were placed in order to adequately decompress the left biliary ducts. Small focus of contrast extravasation near the tip of  the external biliary drain. As a result, the biliary drain was slightly pulled back. Ultrasound images during the procedure demonstrate a small amount of right perihepatic ascites. IMPRESSION: 1. Cholangiogram demonstrates persistent biliary obstruction with a recently placed metallic stent in the left hepatic lobe. Narrowing in the mid stent is compatible with the underlying lesion. Unable to cross the lesion or obstruction from the left percutaneous access. 2. Percutaneous access was lost in left hepatic lobe while trying to cross the biliary obstruction. As a result, a new left percutaneous drain was placed. This is a left external biliary drain. 3. Right biliary drain was known to be dislodged and was completely removed. Small amount of perihepatic ascites noted along the right hepatic lobe. 4. Patient was admitted following the procedure due to the concern for infection after recent ERCP procedure and percutaneous procedure. Patient will need a new right percutaneous drain in order to decompress the right hepatic lobe. Electronically Signed   By: Markus Daft M.D.   On: 11/07/2019 17:19        Scheduled Meds: . aspirin EC  81 mg Oral Daily  . carbidopa-levodopa  1 tablet Oral TID  . metoprolol succinate  100 mg Oral Daily   Continuous Infusions: . sodium chloride 75 mL/hr at 11/08/19 0256  . piperacillin-tazobactam (ZOSYN)  IV 3.375 g (11/08/19 0250)     LOS: 1 day    Time spent:40 min    Deondria Puryear, Geraldo Docker, MD [facility] Pager [phone]  If 7PM-7AM, please contact night-coverage [email or web address] Password [facility] 11/08/2019, 9:42 AM

## 2019-11-09 DIAGNOSIS — N179 Acute kidney failure, unspecified: Secondary | ICD-10-CM | POA: Diagnosis not present

## 2019-11-09 DIAGNOSIS — I10 Essential (primary) hypertension: Secondary | ICD-10-CM | POA: Diagnosis not present

## 2019-11-09 DIAGNOSIS — R600 Localized edema: Secondary | ICD-10-CM | POA: Diagnosis not present

## 2019-11-09 DIAGNOSIS — K831 Obstruction of bile duct: Secondary | ICD-10-CM | POA: Diagnosis not present

## 2019-11-09 LAB — COMPREHENSIVE METABOLIC PANEL
ALT: 10 U/L (ref 0–44)
AST: 50 U/L — ABNORMAL HIGH (ref 15–41)
Albumin: 1.8 g/dL — ABNORMAL LOW (ref 3.5–5.0)
Alkaline Phosphatase: 147 U/L — ABNORMAL HIGH (ref 38–126)
Anion gap: 8 (ref 5–15)
BUN: 22 mg/dL (ref 8–23)
CO2: 20 mmol/L — ABNORMAL LOW (ref 22–32)
Calcium: 8.3 mg/dL — ABNORMAL LOW (ref 8.9–10.3)
Chloride: 109 mmol/L (ref 98–111)
Creatinine, Ser: 1.16 mg/dL — ABNORMAL HIGH (ref 0.44–1.00)
GFR calc Af Amer: 52 mL/min — ABNORMAL LOW (ref >60)
GFR calc non Af Amer: 45 mL/min — ABNORMAL LOW (ref >60)
Glucose, Bld: 91 mg/dL (ref 70–99)
Potassium: 4.1 mmol/L (ref 3.5–5.1)
Sodium: 137 mmol/L (ref 135–145)
Total Bilirubin: 9.7 mg/dL — ABNORMAL HIGH (ref 0.3–1.2)
Total Protein: 5 g/dL — ABNORMAL LOW (ref 6.5–8.1)

## 2019-11-09 LAB — PROCALCITONIN: Procalcitonin: 0.67 ng/mL

## 2019-11-09 LAB — PHOSPHORUS: Phosphorus: 2.8 mg/dL (ref 2.5–4.6)

## 2019-11-09 LAB — MAGNESIUM: Magnesium: 1.9 mg/dL (ref 1.7–2.4)

## 2019-11-09 MED ORDER — DEXTROSE-NACL 5-0.9 % IV SOLN: INTRAVENOUS | Status: DC

## 2019-11-09 NOTE — Progress Notes (Signed)
Referring Physician(s): Clarene Essex  Supervising Physician: Corrie Mckusick  Patient Status:  [facility] - In-pt  Chief Complaint: "Pain at drains"  Subjective:  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 Vernard Gambles; s/p left external biliary drain placement and right external biliary drain exchange in IR 10/24/2019 by Dr. Annamaria Boots; right external biliary drain was inadvertently removed s/p right external biliary drain placement, left external biliary drain exchange, and right lateral abdomen biloma drain placement in IR 11/09/2019 by Dr. Earleen Newport. Patient awake and alert laying in bed. Complains of tenderness at drain insertion sites, as expected. Left biliary drain, right biliary drain, and right lateral biloma drain sites c/d/i.   Allergies: Forteo [parathyroid hormone (recomb)] and Merthiolate [thimerosal]  Medications: Prior to Admission medications   Medication Sig Start Date End Date Taking? Authorizing Provider  amoxicillin (AMOXIL) 500 MG capsule Take 2,000 mg by mouth See admin instructions. Take 4 capsules (2000 mg) by mouth one hour prior to dental appointment 07/31/15  Yes [provider]  aspirin EC 81 MG tablet Take 81 mg by mouth daily.   Yes [provider]  Calcium Carbonate-Vitamin D (CALTRATE 600+D) 600-400 MG-UNIT per tablet Take 1 tablet by mouth daily.   Yes [provider]  carbidopa-levodopa (SINEMET IR) 25-100 MG tablet TAKE 1 TABLET BY MOUTH THREE TIMES DAILY, TAKE 4 HOURS APART Patient taking differently: Take 1 tablet by mouth 3 (three) times daily. 0800, 1200, 1600 05/14/19  Yes Lomax, Amy, NP  furosemide (LASIX) 20 MG tablet Take 20 mg by mouth daily.    Yes [provider]  guaiFENesin (MUCINEX) 600 MG 12 hr tablet Take 600 mg by mouth 2 (two) times daily as needed (congestion).   Yes [provider]  metoprolol succinate (TOPROL-XL) 100 MG 24 hr  tablet Take 100 mg by mouth daily. Take with or immediately following a meal.   Yes [provider]  oxycodone (OXY-IR) 5 MG capsule Take 1 capsule (5 mg total) by mouth every 8 (eight) hours as needed. Patient taking differently: Take 5 mg by mouth every 8 (eight) hours as needed for pain.  11/06/19  Yes Virgel Manifold, MD  Polyethyl Glycol-Propyl Glycol (SYSTANE OP) Place 1 drop into both eyes daily as needed (dry eyes).   Yes [provider]  vitamin C (ASCORBIC ACID) 250 MG tablet Take 500 mg by mouth daily.   Yes [provider]  Vitamin D, Ergocalciferol, (DRISDOL) 50000 units CAPS capsule Take 50,000 Units by mouth 2 (two) times a week. Tuesdays and Fridays   Yes [provider]     Vital Signs: BP (!) 141/64 (BP Location: Left Arm)   Pulse 89   Temp 99 F (37.2 C) (Oral)   Resp 18   Ht 5\' 5"  (1.651 m)   Wt 147 lb (66.7 kg)   SpO2 97%   BMI 24.46 kg/m   Physical Exam Vitals and nursing note reviewed.  Constitutional:      General: She is not in acute distress. Eyes:     General: Scleral icterus present.  Pulmonary:     Effort: Pulmonary effort is normal. No respiratory distress.  Abdominal:     Comments: Left/right biliary drains and right biloma drain sites with minimal tenderness, no erythema, drainage, or active bleeding; Left external biliary drain with approximately 25 cc bloody output in gravity bag; Right external biliary drain with approximately 50 cc bloody output in gravity bag;  Right lateral abdomen biloma drain with approximately 25 cc brown fluid with debris in gravity bag.  Skin:    General: Skin is warm and dry.     Coloration: Skin is jaundiced.  Neurological:     Mental Status: She is alert and oriented to person, place, and time.     Imaging: CT ABDOMEN PELVIS W CONTRAST  Result Date: 11/06/2019 CLINICAL DATA:  Right lower quadrant pain and jaundice. EXAM: CT ABDOMEN AND PELVIS WITH CONTRAST TECHNIQUE: Multidetector  CT imaging of the abdomen and pelvis was performed using the standard protocol following bolus administration of intravenous contrast. CONTRAST:  77mL OMNIPAQUE IOHEXOL 300 MG/ML  SOLN COMPARISON:  10/09/2019 liver MRI FINDINGS: Lower chest:  Mild atelectasis or scarring in the lower lungs. Hepatobiliary: Amorphouscentral hepatic mass poorly defined on this study, roughly 2.9 cm on coronal reformats. There is intrahepatic bile duct obstruction with left and right biliary drains. The right-sided drain has been retracted into the abdominal wall. The left-sided drain is in position over the liver, although there is still prominent left-sided ductal dilatation at the level of segment to. 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 Spleen: Unremarkable. Adrenals/Urinary Tract: Negative adrenals. No hydronephrosis or stone. Unremarkable bladder. Stomach/Bowel: No obstruction. No appendicitis. Numerous left colonic diverticula. Vascular/Lymphatic: No acute vascular abnormality. Atherosclerotic calcification of the aorta. No mass or adenopathy. Reproductive:No pathologic findings. Other: No ascites or pneumoperitoneum. Shallow bilateral groin hernia with fluid density on the right. Musculoskeletal: No acute abnormalities. L5-S1 advanced facet osteoarthritis with anterolisthesis and accelerated disc narrowing. IMPRESSION: Hepatic hilar mass with biliary obstruction and percutaneous drainage catheters. The right-sided drain has been retracted into the abdominal wall. The left-sided drain is in place although there is still prominent dilation of segment 2 ducts. Electronically Signed   By: Monte Fantasia M.D.   On: 11/06/2019 07:31   DG ERCP BILIARY & PANCREATIC DUCTS  Result Date: 11/07/2019 CLINICAL DATA:  79 year old female with biliary obstruction EXAM: ERCP TECHNIQUE: Multiple spot images obtained with the  fluoroscopic device and submitted for interpretation post-procedure. FLUOROSCOPY TIME:  Fluoroscopy Time:  26 minutes 44 seconds COMPARISON:  CT 11/06/2019, fluoro 10/18/2019, 10/24/2019 FINDINGS: Limited intraoperative fluoroscopic spot images during ERCP. Initial image demonstrates the endoscope projecting over the upper abdomen with safety wire in the common bile duct. Partial opacification of the extrahepatic biliary ducts. Subsequently there is deployment of both a retrieval balloon as well as a metallic biliary stent at the site of stricture/occlusion. IMPRESSION: Limited intraoperative fluoroscopic spot images demonstrate treatment of biliary obstruction with deployment of both retrieval balloon as well as metallic biliary stent. Please refer to the dictated operative report for full details of intraoperative findings and procedure. Electronically Signed   By: Corrie Mckusick D.O.   On: 11/07/2019 07:30   IR CHOLANGIOGRAM EXISTING TUBE  Result Date: 11/07/2019 INDICATION: 79 year old with biliary obstruction and undergone multiple procedures for biliary decompression. The patient has had bilateral external biliary drains. Recent CT demonstrated that the right biliary drain was dislodged. Patient underwent ERCP and stent placement on 11/06/2019. Patient presents for repeat cholangiogram through existing catheter and attempt at internalization of a biliary drain. EXAM: 1. CHOLANGIOGRAM THROUGH EXISTING DRAIN 2. PERCUTANEOUS TRANSHEPATIC CHOLANGIOGRAM WITH ULTRASOUND AND FLUOROSCOPIC GUIDANCE AND PLACEMENT OF NEW LEFT BILIARY DRAIN 3. REMOVAL OF THE DISLODGED RIGHT BILIARY DRAIN MEDICATIONS: Zosyn 3.375 g; The antibiotic was administered within an appropriate time frame prior to the initiation  of the procedure. ANESTHESIA/SEDATION: Moderate (conscious) sedation was employed during this procedure. A total of Versed 2.0 mg and Fentanyl 100 mcg was administered intravenously. Moderate Sedation Time: 90 minutes.  The patient's level of consciousness and vital signs were monitored continuously by radiology nursing throughout the procedure under my direct supervision. FLUOROSCOPY TIME:  Fluoroscopy Time: 27 minutes, 42 seconds, [id number] mGy COMPLICATIONS: None immediate. PROCEDURE: Informed written consent was obtained from the patient after a thorough discussion of the procedural risks, benefits and alternatives. All questions were addressed. Maximal Sterile Barrier Technique was utilized including caps, mask, sterile gowns, sterile gloves, sterile drape, hand hygiene and skin antiseptic. A timeout was performed prior to the initiation of the procedure. Patient was placed supine on the interventional table. Both drains were prepped and draped in sterile fashion. Scout image of the abdomen was obtained. Left drain was injected with contrast. The skin around the drain was anesthetized with 1% lidocaine. The retention suture was removed. The catheter was cut and removed over a Bentson wire. Five [place] Kumpe catheter was placed. Additional cholangiogram images were obtained. Eventually, a Roadrunner wire was advanced into branches along the right side of the metallic biliary stent. These branches were not opacifying during the initial contrast injection. A Glide catheter was advanced into these branches and demonstrated additional dilated bile ducts. These most likely represent medial left hepatic bile ducts. Attempted to access the central biliary ducts along the path of the stent. While trying to access the central biliary system, the percutaneous access was completely lost. Unfortunately, catheter would not go through the old puncture site. Therefore, ultrasound was used to identify the left hepatic lobe. A 21 gauge needle was directed into a dilated left biliary duct using ultrasound guidance. 0.018 wire was advanced into the biliary system and transitional dilator set was placed. Contrast injection confirmed placement into the  biliary system. Eventually, the 5 French catheter was again advanced into the left biliary ducts. Access was again obtained into the more medial left biliary ducts. At this point, the decision was made to proceed with another external biliary drain. Initially, a 55 [place] biliary drain was placed but the drain was too long and the side holes were outside of the liver. Therefore, a 10 [place] multipurpose drain was selected and additional side holes were placed. Unfortunately, too many side holes were placed and there was drainage outside of the liver. Therefore, another 10 [place] multipurpose drain was used and 2 additional side holes were placed. The drain was placed over a stiff Amplatz wire. Contrast injection confirmed that the drain was not leaking outside of the liver. However, there was some extravasation near the tip of the catheter. Due to this area of extravasation, the catheter was slightly pulled back. Catheter was attached to a gravity bag. Catheter was sutured to skin. New bandage was placed. FINDINGS: Initial scout image demonstrates that a metallic biliary stent has been placed. Stent extends into the left hepatic ducts. 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 The right-sided biliary drain was still sutured to the skin but the tube was just underneath the skin, within the subcutaneous tissues and not within the liver. The right biliary drain was completely removed. Multiple cholangiogram images in the left hepatic lobe demonstrate an obstructing lesion in the region of the midportion of stent. Catheter and wire could not be advanced into the central bile ducts or common bile duct. However, catheter was advanced into the  more medial left hepatic ducts. After access was obtained into the medial left hepatic ducts, there was some contrast flowing into the biliary stent and common bile duct. Common bile duct distal to the stent appears to  be very tortuous. Large amount of retained contrast in the gallbladder from the recent ERCP procedure. External biliary drain was placed in left biliary ducts. Additional side holes were placed in order to adequately decompress the left biliary ducts. Small focus of contrast extravasation near the tip of the external biliary drain. As a result, the biliary drain was slightly pulled back. Ultrasound images during the procedure demonstrate a small amount of right perihepatic ascites. IMPRESSION: 1. Cholangiogram demonstrates persistent biliary obstruction with a recently placed metallic stent in the left hepatic lobe. Narrowing in the mid stent is compatible with the underlying lesion. Unable to cross the lesion or obstruction from the left percutaneous access. 2. Percutaneous access was lost in left hepatic lobe while trying to cross the biliary obstruction. As a result, a new left percutaneous drain was placed. This is a left external biliary drain. 3. Right biliary drain was known to be dislodged and was completely removed. Small amount of perihepatic ascites noted along the right hepatic lobe. 4. Patient was admitted following the procedure due to the concern for infection after recent ERCP procedure and percutaneous procedure. Patient will need a new right percutaneous drain in order to decompress the right hepatic lobe. Electronically Signed   By: Markus Daft M.D.   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 The right-sided drain has been previously accidentally displaced and remains undrained currently. EXAM: IMAGE GUIDED EXCHANGE OF LEFT-SIDED DRAINAGE CATHETER IMAGE GUIDED PLACEMENT OF EXTERNAL RIGHT-SIDED  BILIARY DRAIN IMAGE GUIDED PLACEMENT OF BILOMA DRAIN OF THE UPPER ABDOMEN ATTEMPT AT GUN SITE TECHNIQUE CONNECTION OF THE INDWELLING STENT AND THE LEFT-SIDED BILIARY DUCTS WHICH WAS ULTIMATELY UNSUCCESSFUL MEDICATIONS: None ANESTHESIA/SEDATION: Moderate (conscious) sedation was employed during this procedure. A total of Versed 4.0 mg and Fentanyl 200 mcg was administered intravenously. Moderate Sedation Time: 3 hours, 43 minutes. The patient's level of consciousness and vital signs were monitored continuously by radiology nursing throughout the procedure under my direct supervision. FLUOROSCOPY TIME:  Fluoroscopy Time: 46 minutes 54 seconds (966 mGy). COMPLICATIONS: None PROCEDURE: Informed written consent was obtained from the patient after a thorough discussion of the procedural risks, benefits and alternatives. All questions were addressed. Maximal Sterile Barrier Technique was utilized including caps, mask, sterile gowns, sterile gloves, sterile drape, hand hygiene and skin antiseptic. A timeout was performed prior to the initiation of the procedure. Patient positioned supine position on the fluoroscopy table. Scout images were acquired. The left-sided drain was injected with contrast confirming location and was removed on a Bentson wire. Short Kumpe the catheter was placed confirming contrast in the left-sided biliary ducts. These do not communicate with the stented system. Six French sheath was then placed in the biliary system. Using the angled catheter we attempted to navigate a wire through the tines of the stent. This was unsuccessful. The back end of both a standard Glidewire and a stiff Glidewire were used to try to penetrate the tines of the stent, unsuccessful. We then proceeded with attempt at gun site technique to connect the  undrained biliary system with the drain biliary system. 1% lidocaine was used for local anesthesia. A Chiba needle was used for a percutaneous access to the stented biliary  system. Once we confirmed needle tip position, 018 V18 wire was advanced into the stent. A quick cross catheter was successful in crossing the tines of the stent into the biliary system. At this point we recognize that are previously existing left-sided biliary drains had withdrawn with the respiration. Ultrasound-guided access into the left-sided stents was then performed, using modified Seldinger technique. The 6 French sheath was successfully replaced into the left-sided ducts. A micro snare was passed percutaneously into the biliary duct, with an ensnare passed through the 6 French sheath. Ultimately the micro snare was too small for targeting. Up sizing to a 4 [place] [employer] micro puncture was performed. We were then able to pass a 15 mm snare through the 4 French sheath and aligned the 2 snares for the gun site technique. Chiba needle was then passed through the 2 snares using fluoroscopy and oblique imaging. Once we confirmed needle tip position, the 18 wire was passed through the needle and the needle was removed. The 6 French sheath was closed and withdrawn wall simultaneously withdrawing the 15 mm gooseneck snare within the bile stent. Withdrawing the wire was performed without fluoroscopy, and the next fluoro image demonstrated that the snare within the stent system failed to engage the wire. The wire was externalized through the 6 French sheath. Once we recognized that the intraductal snare did not capture the wire, this system was removed from the biliary system. We then simply replaced the left-sided drain with a 10 French pigtail drainage catheter. We then proceeded with right-sided drainage. Ultrasound survey of the right liver was performed, with then ultrasound of the right liver lobe. Window into the right biliary system was present for the right liver approach. Sizable fluid collection on the liver was also demonstrated. 1% lidocaine was used for local anesthesia, with generous  infiltration of the skin and subcutaneous tissues in and inter left costal location. A Chiba needle was advanced under ultrasound guidance into the right liver lobe. Once the tip of the needle was confirmed within the biliary system by injecting small aliquots of contrast, images were stored of the biliary system after partially opacifying the biliary tree via the needle. Once the tip of this needle was confirmed within the biliary system, an 018 wire was advanced centrally. The needle was removed, a small incision was made with an 11 blade scalpel, and then a triaxial Accustick system was advanced into the biliary system. The metal stiffener and dilator were removed, we confirmed placement with contrast infusion. 035 wire was placed. Dilation was performed with 10 [place] dilator, and then a 10 French pigtail catheter was advanced into the right-sided system. Catheter was formed into the right-sided ducts draining the right-sided ductal system. There was no communication with the stented system. Finally, given the patient's tenderness and peritoneal signs on palpation, we determined that eighth biloma drain was indicated. Ultrasound-guided placement of a 10 French biloma drain into the right abdomen was performed using standard modified Seldinger technique. This 10 French catheter was placed and sutured in position, attached to gravity drainage. The patient tolerated the procedure well and remained hemodynamically stable throughout. No complications were encountered and no significant blood loss was encountered. FINDINGS: Failed gun site technique attempt at connecting the stented biliary system with the left-sided ductal system. Exchange of left externalized biliary drain for  a new 8 [place] drain to gravity. Placement of a new right-sided externalized biliary drain into the right-sided ducks, 10 [place] drain. Placement of biloma drain into the lateral right abdomen. IMPRESSION: Status post failed gun site technique  puncture for attempted connection of the undrained left-sided biliary ductal system with the stented central ductal system. Status post exchange of left-sided ductal system with new 8 French drain, placement of new right-sided biliary externalized drainage catheter, as well as biloma drain in the upper abdomen. Signed, Dulcy Fanny. Dellia Nims, RPVI [facility] Radiology Electronically Signed   By: Corrie Mckusick D.O.   On: 11/08/2019 16:27   IR BILIARY DRAIN PLACEMENT WITH CHOLANGIOGRAM  Result Date: 11/07/2019 INDICATION: 79 year old with biliary obstruction and undergone multiple procedures for biliary decompression. The patient has had bilateral external biliary drains. Recent CT demonstrated that the right biliary drain was dislodged. Patient underwent ERCP and stent placement on 11/06/2019. Patient presents for repeat cholangiogram through existing catheter and attempt at internalization of a biliary drain. EXAM: 1. CHOLANGIOGRAM THROUGH EXISTING DRAIN 2. PERCUTANEOUS TRANSHEPATIC CHOLANGIOGRAM WITH ULTRASOUND AND FLUOROSCOPIC GUIDANCE AND PLACEMENT OF NEW LEFT BILIARY DRAIN 3. REMOVAL OF THE DISLODGED RIGHT BILIARY DRAIN MEDICATIONS: Zosyn 3.375 g; The antibiotic was administered within an appropriate time frame prior to the initiation of the procedure. ANESTHESIA/SEDATION: Moderate (conscious) sedation was employed during this procedure. A total of Versed 2.0 mg and Fentanyl 100 mcg was administered intravenously. Moderate Sedation Time: 90 minutes. The patient's level of consciousness and vital signs were monitored continuously by radiology nursing throughout the procedure under my direct supervision. FLUOROSCOPY TIME:  Fluoroscopy Time: 27 minutes, 42 seconds, [id number] mGy COMPLICATIONS: None immediate. PROCEDURE: Informed written consent was obtained from the patient after a thorough discussion of the procedural risks, benefits and alternatives. All questions were  addressed. Maximal Sterile Barrier Technique was utilized including caps, mask, sterile gowns, sterile gloves, sterile drape, hand hygiene and skin antiseptic. A timeout was performed prior to the initiation of the procedure. Patient was placed supine on the interventional table. Both drains were prepped and draped in sterile fashion. Scout image of the abdomen was obtained. Left drain was injected with contrast. The skin around the drain was anesthetized with 1% lidocaine. The retention suture was removed. The catheter was cut and removed over a Bentson wire. Five [place] Kumpe catheter was placed. Additional cholangiogram images were obtained. Eventually, a Roadrunner wire was advanced into branches along the right side of the metallic biliary stent. These branches were not opacifying during the initial contrast injection. A Glide catheter was advanced into these branches and demonstrated additional dilated bile ducts. These most likely represent medial left hepatic bile ducts. Attempted to access the central biliary ducts along the path of the stent. While trying to access the central biliary system, the percutaneous access was completely lost. Unfortunately, catheter would not go through the old puncture site. Therefore, ultrasound was used to identify the left hepatic lobe. A 21 gauge needle was directed into a dilated left biliary duct using ultrasound guidance. 0.018 wire was advanced into the biliary system and transitional dilator set was placed. Contrast injection confirmed placement into the biliary system. Eventually, the 5 French catheter was again advanced into the left biliary ducts. Access was again obtained into the more medial left biliary ducts. At this point, the decision was made to proceed with another external biliary drain. Initially, a 51 [place] biliary drain was placed but the drain was too long and the  side holes were outside of the liver. Therefore, a 10 [place] multipurpose drain was  selected and additional side holes were placed. Unfortunately, too many side holes were placed and there was drainage outside of the liver. Therefore, another 10 [place] multipurpose drain was used and 2 additional side holes were placed. The drain was placed over a stiff Amplatz wire. Contrast injection confirmed that the drain was not leaking outside of the liver. However, there was some extravasation near the tip of the catheter. Due to this area of extravasation, the catheter was slightly pulled back. Catheter was attached to a gravity bag. Catheter was sutured to skin. New bandage was placed. FINDINGS: Initial scout image demonstrates that a metallic biliary stent has been placed. Stent extends into the left hepatic ducts. 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 The right-sided biliary drain was still sutured to the skin but the tube was just underneath the skin, within the subcutaneous tissues and not within the liver. The right biliary drain was completely removed. Multiple cholangiogram images in the left hepatic lobe demonstrate an obstructing lesion in the region of the midportion of stent. Catheter and wire could not be advanced into the central bile ducts or common bile duct. However, catheter was advanced into the more medial left hepatic ducts. After access was obtained into the medial left hepatic ducts, there was some contrast flowing into the biliary stent and common bile duct. Common bile duct distal to the stent appears to be very tortuous. Large amount of retained contrast in the gallbladder from the recent ERCP procedure. External biliary drain was placed in left biliary ducts. Additional side holes were placed in order to adequately decompress the left biliary ducts. Small focus of contrast extravasation near the tip of the external biliary drain. As a result, the biliary drain was slightly pulled back. Ultrasound images during  the procedure demonstrate a small amount of right perihepatic ascites. IMPRESSION: 1. Cholangiogram demonstrates persistent biliary obstruction with a recently placed metallic stent in the left hepatic lobe. Narrowing in the mid stent is compatible with the underlying lesion. Unable to cross the lesion or obstruction from the left percutaneous access. 2. Percutaneous access was lost in left hepatic lobe while trying to cross the biliary obstruction. As a result, a new left percutaneous drain was placed. This is a left external biliary drain. 3. Right biliary drain was known to be dislodged and was completely removed. Small amount of perihepatic ascites noted along the right hepatic lobe. 4. Patient was admitted following the procedure due to the concern for infection after recent ERCP procedure and percutaneous procedure. Patient will need a new right percutaneous drain in order to decompress the right hepatic lobe. Electronically Signed   By: Markus Daft M.D.   On: 11/07/2019 17:19    Labs:  CBC: Recent Labs    11/06/19 0248 11/07/19 0803 11/08/19 0242 11/08/19 1453  WBC 10.9* 19.0* 11.7* 9.2  HGB 12.0 10.8* 9.6* 10.5*  HCT 36.3 32.7* 29.5* 32.0*  PLT 421* 330 287 324    COAGS: Recent Labs    10/18/19 1020 10/24/19 0833 11/07/19 1433  INR 1.0 1.0 1.2  APTT  --   --  38*    BMP: Recent Labs    11/06/19 0248 11/07/19 0803 11/08/19 0242 11/09/19 0602  NA 136 134* 136 137  K 4.1 5.1 4.7 4.1  CL 105 103 107 109  CO2 18* 19* 21* 20*  GLUCOSE 145* 96 84 91  BUN 39* 34* 32* 22  CALCIUM 9.2 8.9 8.5* 8.3*  CREATININE 1.45* 1.38* 1.34* 1.16*  GFRNONAA 34* 37* 38* 45*  GFRAA 40* 42* 44* 52*    LIVER FUNCTION TESTS: Recent Labs    11/06/19 0248 11/07/19 0803 11/08/19 0242 11/09/19 0602  BILITOT 12.3* 13.7* 11.6* 9.7*  AST 67* 44* 42* 50*  ALT 19 15 7 10   ALKPHOS 235* 174* 147* 147*  PROT 6.3* 5.6* 4.9* 5.0*  ALBUMIN 2.7* 2.3* 1.9* 1.8*    Assessment and  Plan:  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 Vernard Gambles; s/p left external biliary drain placement and right external biliary drain exchange in IR 10/24/2019 by Dr. Annamaria Boots; right external biliary drain was inadvertently removed s/p right external biliary drain placement, left external biliary drain exchange, and right lateral abdomen biloma drain placement in IR 11/09/2019 by Dr. Earleen Newport. Left external biliary drain stable with approximately 25 cc bloody output in gravity bag. Right external biliary drain stable with approximately 50 cc bloody output in gravity bag. Right lateral abdomen biloma drain stable with approximately 25 cc brown fluid with debris in gravity bag. Tbili down to 9.7 (from 11.6 yesterday). Continue current drain management- continue with Qshift flushes/monitor of output. Further plans per [facility]- appreciate and agree with management. IR to follow.    Electronically Signed: Earley Abide, PA-C 11/09/2019, 11:07 AM   I spent a total of 25 Minutes at the the patient's bedside AND on the patient's hospital floor or unit, greater than 50% of which was counseling/coordinating care for biliary obstruction s/p biliary drain placements.

## 2019-11-09 NOTE — Progress Notes (Signed)
PROGRESS NOTE    Hailey [name]  [id number] DOB: 1940-08-10 DOA: 11/07/2019 PCP: Prince Solian, MD     Brief Narrative:  Hailey [name] is a 79 y.o. 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    Presents to IR today for evaluation of her external biliary drains as well as ongoing attempts of internalization and possible brush biopsies.  She underwent attempt of internal biliary drain placement 10/18/19, however ultimately left with right-sided external biliary drain placement due to inability to pass the obstruction. She returned 10/24/19 for reattempt to pass the obstruction, however this was unsuccessful and a new left-sided external biliary drain was placed. In the interim she has also undergone several attempts at ERCP to pass the stricture; s/p ERCP 08/27/19, 10/15/19, and repeat yesterday 11/06/19 by GI Dr. Clarene Essex.   Patient underwent cholangiogram through existing drain, attempts repositioning of existing left hepatic drain that required a new puncture due to lost access, placement of new left intrahepatic drain and removal of dislodged right hepatic drain.  Patient's white count elevated however she remains afebrile.  She received IV Zosyn during the procedure.  IR requested to admit the patient due to complicated procedure and elevated white count.  During encounter: Patient lying comfortably on the bed.  Reports that she is doing fine, denies any pain, fever, chills, nausea, vomiting, diarrhea, headache, blurry vision, chest pain, shortness of breath, urinary or bowel changes.  She has chronic leg swelling and she takes Lasix as needed at home.  No history of CHF, orthopnea or PND.  She lives alone.  No history of smoking, alcohol, is a drug use.  Review of Systems: As per HPI otherwise negative.     Subjective: 4/17 A/O x4, negative S OB, negative CP, negative abdominal pain.     Assessment & Plan:   Principal Problem:   Biliary obstruction Active Problems:   Parkinson's disease ([place])   Hypertension   AKI (acute kidney injury) ([place])   Hyponatremia   Leukocytosis   Normocytic anemia   Bilateral leg edema  Biliary obstruction/Cholangiocarcinoma obstructive -Discussed case with Dr. Nolen Mu GI, and also read DO. Damita Dunnings note IR.  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 -In order to proceed with for definitive diagnostic/therapeutic treatment patient's drains must be first internalized. -Per Dr. Michail Sermon, Dr. Barron Schmid GI is the subject matter expert when it comes to cholangiocarcinoma and this is his patient.  He will notify Dr. Watt Climes patient is here, expect that Dr. Watt Climes and Dr. Earleen Newport will discuss options for definitive treatment on Monday 4/19. -4/16 s/p biliary drain placement see notes below -Continue IV antibiotics -Continue hydration D5-0.9% saline 71ml/hr -4/17 advance diet to full liquid diet -4/17 care of biliary drain  -Ensure all biliary tubes are flushed daily  -Ensure we empty and record all discharge from biliary drains daily  AKI (baseline Cr 0.90) Recent Labs  Lab 11/06/19 0248 11/07/19 0803 11/08/19 0242 11/09/19 0602  CREATININE 1.45* 1.38* 1.34* 1.16*  -Creatinine still not at baseline but improving  -Chronic bilateral lower leg swelling -Patient appears euvolemic -Strict in and out -Hold home Lasix 20 mg daily  Hyponatremia -Resolved.  Parkinson disease -Sinemet 25-100 mg 1 tab daily  Essential HTN -Toprol XL 100 mg daily  Normocytic anemia: H&H: 10.8/32.7. -4/16 anemia panel consistent with normocytic anemia -Transfuse for hemoglobin<  DVT prophylaxis: SCD Code Status: Full Family Communication: 4/17 daughter-in-law  at bedside counseled on plan of care answered all questions Disposition Plan:  1.  Where the patient is from 2.  Anticipated  d/c place. 3.  Barriers to d/c does patient have cholangiocarcinoma or not?  Does patient need cancer work-up or not?   Consultants:  IR DO. Liana Crocker phone consult Dr. Nolen Mu GI   Procedures/Significant Events:  4/16 biliary drain placement x3;Exchange of left biliary drain for new 8.55F drain.  (white midline to gravity) New right external biliary drain placement, with 56F drain.  (blue angiodynamics/exodus, to gravity) New peritoneal drain for biloma, 56F.   (blue BS/flexima, to gravity)   Attempt at connection of the left biliary system through the existing biliary stent with gunsight technique, without success.    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 This is causing peritonitis/TTP, and we placed a 56F biloma drain for relief.  - The right and left ducts do not communicate.    Complications: None  Recommendations:  - 3 drains to gravity - routine drain care for all 3, with routine flush and record output - Do not submerge    I have personally reviewed and interpreted all radiology studies and my findings are as above.  VENTILATOR SETTINGS:    Cultures   Antimicrobials: Anti-infectives (From admission, onward)   Start     Dose/Rate Stop   11/07/19 1800  piperacillin-tazobactam (ZOSYN) IVPB 3.375 g     3.375 g 12.5 mL/hr over 240 Minutes     11/07/19 0815  piperacillin-tazobactam (ZOSYN) IVPB 3.375 g     3.375 g 12.5 mL/hr over 240 Minutes 11/07/19 1617      Devices    LINES / TUBES:      Continuous Infusions: . sodium chloride 75 mL/hr at 11/08/19 2133  . piperacillin-tazobactam (ZOSYN)  IV 3.375 g (11/09/19 0806)     Objective: Vitals:   11/08/19 1539 11/08/19 2037 11/09/19 0146 11/09/19 0525  BP: (!) 131/58 139/70 (!) 132/56 130/60  Pulse: 73 70 78 69  Resp: 18 20 18 18   Temp: 98.3 F (36.8 C) 98.4 F (36.9 C) 99.1 F (37.3 C) 98.1 F (36.7 C)  TempSrc: Oral Oral Oral Oral  SpO2: 100% 98% 98% 98%    Weight:      Height:        Intake/Output Summary (Last 24 hours) at 11/09/2019 0941 Last data filed at 11/09/2019 0600 Gross per 24 hour  Intake 30 ml  Output 965 ml  Net -935 ml   Filed Weights   11/07/19 0802  Weight: 66.7 kg    Examination:  General: A/O x4, no acute respiratory distress, cachectic Eyes: negative scleral hemorrhage, negative anisocoria, negative icterus ENT: Negative Runny nose, negative gingival bleeding, Neck:  Negative scars, masses, torticollis, lymphadenopathy, JVD Lungs: Clear to auscultation bilaterally without wheezes or crackles Cardiovascular: Regular rate and rhythm without murmur gallop or rub normal S1 and S2 Abdomen: negative abdominal pain, nondistended, positive soft, bowel sounds, no rebound, no ascites, no appreciable mass, 3x Biliary drains present (draining serosanguineous fluid) Extremities: No significant cyanosis, clubbing, or edema bilateral lower extremities Skin: Positive jaundiced Psychiatric:  Negative depression, negative anxiety, negative fatigue, negative mania  Central nervous system:  Cranial nerves II through XII intact, tongue/uvula midline, all extremities muscle strength 5/5, sensation intact throughout, finger nose finger bilateral within normal limits, quick finger touch bilateral within normal limits, negative Romberg sign, heel to shin bilateral within normal limits, standing on 1 foot bilateral within normal limits,  walking on tiptoes within normal limits, walking on heels within normal limits, negative dysarthria, negative expressive aphasia, negative receptive aphasia.  .     Data Reviewed: Care during the described time interval was provided by me .  I have reviewed this patient's available data, including medical history, events of note, physical examination, and all test results as part of my evaluation.  CBC: Recent Labs  Lab 11/06/19 0248 11/07/19 0803 11/08/19 0242 11/08/19 1453  WBC 10.9* 19.0* 11.7*  9.2  NEUTROABS  --  17.0* 10.1* 8.1*  HGB 12.0 10.8* 9.6* 10.5*  HCT 36.3 32.7* 29.5* 32.0*  MCV 99.5 98.2 98.0 98.8  PLT 421* 330 287 0000000   Basic Metabolic Panel: Recent Labs  Lab 11/06/19 0248 11/07/19 0803 11/07/19 1433 11/08/19 0242 11/09/19 0602  NA 136 134*  --  136 137  K 4.1 5.1  --  4.7 4.1  CL 105 103  --  107 109  CO2 18* 19*  --  21* 20*  GLUCOSE 145* 96  --  84 91  BUN 39* 34*  --  32* 22  CREATININE 1.45* 1.38*  --  1.34* 1.16*  CALCIUM 9.2 8.9  --  8.5* 8.3*  MG  --   --  1.8  --  1.9  PHOS  --   --   --   --  2.8   GFR: Estimated Creatinine Clearance: 36 mL/min (A) (by C-G formula based on SCr of 1.16 mg/dL (H)). Liver Function Tests: Recent Labs  Lab 11/06/19 0248 11/07/19 0803 11/08/19 0242 11/09/19 0602  AST 67* 44* 42* 50*  ALT 19 15 7 10   ALKPHOS 235* 174* 147* 147*  BILITOT 12.3* 13.7* 11.6* 9.7*  PROT 6.3* 5.6* 4.9* 5.0*  ALBUMIN 2.7* 2.3* 1.9* 1.8*   Recent Labs  Lab 11/06/19 0248  LIPASE 30   No results for input(s): AMMONIA in the last 168 hours. Coagulation Profile: Recent Labs  Lab 11/07/19 1433  INR 1.2   Cardiac Enzymes: No results for input(s): CKTOTAL, CKMB, CKMBINDEX, TROPONINI in the last 168 hours. BNP (last 3 results) No results for input(s): PROBNP in the last 8760 hours. HbA1C: No results for input(s): HGBA1C in the last 72 hours. CBG: No results for input(s): GLUCAP in the last 168 hours. Lipid Profile: No results for input(s): CHOL, HDL, LDLCALC, TRIG, CHOLHDL, LDLDIRECT in the last 72 hours. Thyroid Function Tests: No results for input(s): TSH, T4TOTAL, FREET4, T3FREE, THYROIDAB in the last 72 hours. Anemia Panel: Recent Labs    11/08/19 0242  FERRITIN 284  TIBC 244*  IRON 38   Sepsis Labs: Recent Labs  Lab 11/07/19 1433 11/08/19 1453 11/09/19 0602  PROCALCITON  --  0.80 0.67  LATICACIDVEN 0.8  --   --     Recent Results (from the past 240 hour(s))  SARS CORONAVIRUS 2 (TAT 6-24 HRS)  Nasopharyngeal Nasopharyngeal Swab     Status: None   Collection Time: 11/02/19  1:28 PM   Specimen: Nasopharyngeal Swab  Result Value Ref Range Status   SARS Coronavirus 2 NEGATIVE NEGATIVE Final    Comment: (NOTE) SARS-CoV-2 target nucleic acids are NOT DETECTED. The SARS-CoV-2 RNA is generally detectable in upper and lower respiratory specimens during the acute phase of infection. Negative results do not preclude SARS-CoV-2 infection, do not rule out co-infections with other pathogens, and should not be used as the sole basis for treatment or other patient management decisions. Negative results must be combined with clinical observations, patient history,  and epidemiological information. The expected result is Negative. Fact Sheet for Patients: SugarRoll.be Fact Sheet for Healthcare Providers: https://www.-mathews.com/ This test is not yet approved or cleared by the [place] FDA and  has been authorized for detection and/or diagnosis of SARS-CoV-2 by FDA under an Emergency Use Authorization (EUA). This EUA will remain  in effect (meaning this test can be used) for the duration of the COVID-19 declaration under Section 56 4(b)(1) of the Act, 21 U.S.C. section 360bbb-3(b)(1), unless the authorization is terminated or revoked sooner. Performed at [facility], [place] [address] [place], [place] [address]   Culture, blood (routine x 2)     Status: None (Preliminary result)   Collection Time: 11/07/19  2:25 PM   Specimen: BLOOD  Result Value Ref Range Status   Specimen Description BLOOD LEFT ANTECUBITAL  Final   Special Requests   Final    BOTTLES DRAWN AEROBIC AND ANAEROBIC Blood Culture adequate volume   Culture   Final    NO GROWTH 2 DAYS Performed at [facility], [place] [address], [place], [place] [address]    Report Status PENDING  Incomplete  Culture, blood (routine x 2)     Status: None (Preliminary result)    Collection Time: 11/07/19  2:34 PM   Specimen: BLOOD  Result Value Ref Range Status   Specimen Description BLOOD LEFT ANTECUBITAL  Final   Special Requests   Final    BOTTLES DRAWN AEROBIC AND ANAEROBIC Blood Culture adequate volume   Culture   Final    NO GROWTH 2 DAYS Performed at [facility], [place] [address], [place], [place] [address]    Report Status PENDING  Incomplete         Radiology Studies: IR CHOLANGIOGRAM EXISTING TUBE  Result Date: 11/07/2019 INDICATION: 79 year old with biliary obstruction and undergone multiple procedures for biliary decompression. The patient has had bilateral external biliary drains. Recent CT demonstrated that the right biliary drain was dislodged. Patient underwent ERCP and stent placement on 11/06/2019. Patient presents for repeat cholangiogram through existing catheter and attempt at internalization of a biliary drain. EXAM: 1. CHOLANGIOGRAM THROUGH EXISTING DRAIN 2. PERCUTANEOUS TRANSHEPATIC CHOLANGIOGRAM WITH ULTRASOUND AND FLUOROSCOPIC GUIDANCE AND PLACEMENT OF NEW LEFT BILIARY DRAIN 3. REMOVAL OF THE DISLODGED RIGHT BILIARY DRAIN MEDICATIONS: Zosyn 3.375 g; The antibiotic was administered within an appropriate time frame prior to the initiation of the procedure. ANESTHESIA/SEDATION: Moderate (conscious) sedation was employed during this procedure. A total of Versed 2.0 mg and Fentanyl 100 mcg was administered intravenously. Moderate Sedation Time: 90 minutes. The patient's level of consciousness and vital signs were monitored continuously by radiology nursing throughout the procedure under my direct supervision. FLUOROSCOPY TIME:  Fluoroscopy Time: 27 minutes, 42 seconds, [id number] mGy COMPLICATIONS: None immediate. PROCEDURE: Informed written consent was obtained from the patient after a thorough discussion of the procedural risks, benefits and alternatives. All questions were addressed. Maximal Sterile Barrier Technique was utilized including caps,  mask, sterile gowns, sterile gloves, sterile drape, hand hygiene and skin antiseptic. A timeout was performed prior to the initiation of the procedure. Patient was placed supine on the interventional table. Both drains were prepped and draped in sterile fashion. Scout image of the abdomen was obtained. Left drain was injected with contrast. The skin around the drain was anesthetized with 1% lidocaine. The retention suture was removed. The catheter was cut and removed over a Bentson wire. Five [place] Kumpe catheter was placed. Additional cholangiogram images were obtained. Eventually,  a Roadrunner wire was advanced into branches along the right side of the metallic biliary stent. These branches were not opacifying during the initial contrast injection. A Glide catheter was advanced into these branches and demonstrated additional dilated bile ducts. These most likely represent medial left hepatic bile ducts. Attempted to access the central biliary ducts along the path of the stent. While trying to access the central biliary system, the percutaneous access was completely lost. Unfortunately, catheter would not go through the old puncture site. Therefore, ultrasound was used to identify the left hepatic lobe. A 21 gauge needle was directed into a dilated left biliary duct using ultrasound guidance. 0.018 wire was advanced into the biliary system and transitional dilator set was placed. Contrast injection confirmed placement into the biliary system. Eventually, the 5 French catheter was again advanced into the left biliary ducts. Access was again obtained into the more medial left biliary ducts. At this point, the decision was made to proceed with another external biliary drain. Initially, a 72 [place] biliary drain was placed but the drain was too long and the side holes were outside of the liver. Therefore, a 10 [place] multipurpose drain was selected and additional side holes were placed. Unfortunately, too many side  holes were placed and there was drainage outside of the liver. Therefore, another 10 [place] multipurpose drain was used and 2 additional side holes were placed. The drain was placed over a stiff Amplatz wire. Contrast injection confirmed that the drain was not leaking outside of the liver. However, there was some extravasation near the tip of the catheter. Due to this area of extravasation, the catheter was slightly pulled back. Catheter was attached to a gravity bag. Catheter was sutured to skin. New bandage was placed. FINDINGS: Initial scout image demonstrates that a metallic biliary stent has been placed. Stent extends into the left hepatic ducts. 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 The right-sided biliary drain was still sutured to the skin but the tube was just underneath the skin, within the subcutaneous tissues and not within the liver. The right biliary drain was completely removed. Multiple cholangiogram images in the left hepatic lobe demonstrate an obstructing lesion in the region of the midportion of stent. Catheter and wire could not be advanced into the central bile ducts or common bile duct. However, catheter was advanced into the more medial left hepatic ducts. After access was obtained into the medial left hepatic ducts, there was some contrast flowing into the biliary stent and common bile duct. Common bile duct distal to the stent appears to be very tortuous. Large amount of retained contrast in the gallbladder from the recent ERCP procedure. External biliary drain was placed in left biliary ducts. Additional side holes were placed in order to adequately decompress the left biliary ducts. Small focus of contrast extravasation near the tip of the external biliary drain. As a result, the biliary drain was slightly pulled back. Ultrasound images during the procedure demonstrate a small amount of right perihepatic ascites.  IMPRESSION: 1. Cholangiogram demonstrates persistent biliary obstruction with a recently placed metallic stent in the left hepatic lobe. Narrowing in the mid stent is compatible with the underlying lesion. Unable to cross the lesion or obstruction from the left percutaneous access. 2. Percutaneous access was lost in left hepatic lobe while trying to cross the biliary obstruction. As a result, a new left percutaneous drain was placed. This is a left external biliary drain. 3.  Right biliary drain was known to be dislodged and was completely removed. Small amount of perihepatic ascites noted along the right hepatic lobe. 4. Patient was admitted following the procedure due to the concern for infection after recent ERCP procedure and percutaneous procedure. Patient will need a new right percutaneous drain in order to decompress the right hepatic lobe. Electronically Signed   By: Markus Daft M.D.   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 The right-sided drain has been previously accidentally displaced and remains undrained currently. EXAM: IMAGE GUIDED EXCHANGE OF LEFT-SIDED DRAINAGE CATHETER IMAGE GUIDED PLACEMENT OF EXTERNAL RIGHT-SIDED BILIARY DRAIN IMAGE GUIDED PLACEMENT OF BILOMA DRAIN OF THE UPPER ABDOMEN ATTEMPT AT GUN SITE TECHNIQUE CONNECTION OF THE INDWELLING STENT AND THE LEFT-SIDED BILIARY DUCTS WHICH WAS ULTIMATELY UNSUCCESSFUL MEDICATIONS: None ANESTHESIA/SEDATION: Moderate (conscious) sedation was employed during this procedure. A total of Versed 4.0 mg and Fentanyl 200 mcg was administered intravenously. Moderate Sedation Time: 3 hours, 43 minutes. The patient's level of consciousness and vital signs  were monitored continuously by radiology nursing throughout the procedure under my direct supervision. FLUOROSCOPY TIME:  Fluoroscopy Time: 46 minutes 54 seconds (966 mGy). COMPLICATIONS: None PROCEDURE: Informed written consent was obtained from the patient after a thorough discussion of the procedural risks, benefits and alternatives. All questions were addressed. Maximal Sterile Barrier Technique was utilized including caps, mask, sterile gowns, sterile gloves, sterile drape, hand hygiene and skin antiseptic. A timeout was performed prior to the initiation of the procedure. Patient positioned supine position on the fluoroscopy table. Scout images were acquired. The left-sided drain was injected with contrast confirming location and was removed on a Bentson wire. Short Kumpe the catheter was placed confirming contrast in the left-sided biliary ducts. These do not communicate with the stented system. Six French sheath was then placed in the biliary system. Using the angled catheter we attempted to navigate a wire through the tines of the stent. This was unsuccessful. The back end of both a standard Glidewire and a stiff Glidewire were used to try to penetrate the tines of the stent, unsuccessful. We then proceeded with attempt at gun site technique to connect the undrained biliary system with the drain biliary system. 1% lidocaine was used for local anesthesia. A Chiba needle was used for a percutaneous access to the stented biliary system. Once we confirmed needle tip position, 018 V18 wire was advanced into the stent. A quick cross catheter was successful in crossing the tines of the stent into the biliary system. At this point we recognize that are previously existing left-sided biliary drains had withdrawn with the respiration. Ultrasound-guided access into the left-sided stents was then performed, using modified Seldinger technique. The 6 French sheath was successfully replaced into the left-sided ducts. A  micro snare was passed percutaneously into the biliary duct, with an ensnare passed through the 6 French sheath. Ultimately the micro snare was too small for targeting. Up sizing to a 4 [place] [employer] micro puncture was performed. We were then able to pass a 15 mm snare through the 4 French sheath and aligned the 2 snares for the gun site technique. Chiba needle was then passed through the 2 snares using fluoroscopy and oblique imaging. Once we confirmed needle tip position, the  18 wire was passed through the needle and the needle was removed. The 6 French sheath was closed and withdrawn wall simultaneously withdrawing the 15 mm gooseneck snare within the bile stent. Withdrawing the wire was performed without fluoroscopy, and the next fluoro image demonstrated that the snare within the stent system failed to engage the wire. The wire was externalized through the 6 French sheath. Once we recognized that the intraductal snare did not capture the wire, this system was removed from the biliary system. We then simply replaced the left-sided drain with a 10 French pigtail drainage catheter. We then proceeded with right-sided drainage. Ultrasound survey of the right liver was performed, with then ultrasound of the right liver lobe. Window into the right biliary system was present for the right liver approach. Sizable fluid collection on the liver was also demonstrated. 1% lidocaine was used for local anesthesia, with generous infiltration of the skin and subcutaneous tissues in and inter left costal location. A Chiba needle was advanced under ultrasound guidance into the right liver lobe. Once the tip of the needle was confirmed within the biliary system by injecting small aliquots of contrast, images were stored of the biliary system after partially opacifying the biliary tree via the needle. Once the tip of this needle was confirmed within the biliary system, an 018 wire was advanced centrally. The needle was  removed, a small incision was made with an 11 blade scalpel, and then a triaxial Accustick system was advanced into the biliary system. The metal stiffener and dilator were removed, we confirmed placement with contrast infusion. 035 wire was placed. Dilation was performed with 10 [place] dilator, and then a 10 French pigtail catheter was advanced into the right-sided system. Catheter was formed into the right-sided ducts draining the right-sided ductal system. There was no communication with the stented system. Finally, given the patient's tenderness and peritoneal signs on palpation, we determined that eighth biloma drain was indicated. Ultrasound-guided placement of a 10 French biloma drain into the right abdomen was performed using standard modified Seldinger technique. This 10 French catheter was placed and sutured in position, attached to gravity drainage. The patient tolerated the procedure well and remained hemodynamically stable throughout. No complications were encountered and no significant blood loss was encountered. FINDINGS: Failed gun site technique attempt at connecting the stented biliary system with the left-sided ductal system. Exchange of left externalized biliary drain for a new 8 [place] drain to gravity. Placement of a new right-sided externalized biliary drain into the right-sided ducks, 10 [place] drain. Placement of biloma drain into the lateral right abdomen. IMPRESSION: Status post failed gun site technique puncture for attempted connection of the undrained left-sided biliary ductal system with the stented central ductal system. Status post exchange of left-sided ductal system with new 8 French drain, placement of new right-sided biliary externalized drainage catheter, as well as biloma drain in the upper abdomen. Signed, Dulcy Fanny. Dellia Nims, RPVI [facility] Radiology Electronically Signed   By: Corrie Mckusick D.O.   On: 11/08/2019 16:27   IR  BILIARY DRAIN PLACEMENT WITH CHOLANGIOGRAM  Result Date: 11/07/2019 INDICATION: 79 year old with biliary obstruction and undergone multiple procedures for biliary decompression. The patient has had bilateral external biliary drains. Recent CT demonstrated that the right biliary drain was dislodged. Patient underwent ERCP and stent placement on 11/06/2019. Patient presents for repeat cholangiogram through existing catheter and attempt at internalization of a biliary drain. EXAM: 1. CHOLANGIOGRAM THROUGH EXISTING DRAIN 2. PERCUTANEOUS TRANSHEPATIC CHOLANGIOGRAM  WITH ULTRASOUND AND FLUOROSCOPIC GUIDANCE AND PLACEMENT OF NEW LEFT BILIARY DRAIN 3. REMOVAL OF THE DISLODGED RIGHT BILIARY DRAIN MEDICATIONS: Zosyn 3.375 g; The antibiotic was administered within an appropriate time frame prior to the initiation of the procedure. ANESTHESIA/SEDATION: Moderate (conscious) sedation was employed during this procedure. A total of Versed 2.0 mg and Fentanyl 100 mcg was administered intravenously. Moderate Sedation Time: 90 minutes. The patient's level of consciousness and vital signs were monitored continuously by radiology nursing throughout the procedure under my direct supervision. FLUOROSCOPY TIME:  Fluoroscopy Time: 27 minutes, 42 seconds, [id number] COMPLICATIONS: None immediate. PROCEDURE: Informed written consent was obtained from the patient after a thorough discussion of the procedural risks, benefits and alternatives. All questions were addressed. Maximal Sterile Barrier Technique was utilized including caps, mask, sterile gowns, sterile gloves, sterile drape, hand hygiene and skin antiseptic. A timeout was performed prior to the initiation of the procedure. Patient was placed supine on the interventional table. Both drains were prepped and draped in sterile fashion. Scout image of the abdomen was obtained. Left drain was injected with contrast. The skin around the drain was anesthetized with 1% lidocaine. The retention  suture was removed. The catheter was cut and removed over a Bentson wire. Five [place] Kumpe catheter was placed. Additional cholangiogram images were obtained. Eventually, a Roadrunner wire was advanced into branches along the right side of the metallic biliary stent. These branches were not opacifying during the initial contrast injection. A Glide catheter was advanced into these branches and demonstrated additional dilated bile ducts. These most likely represent medial left hepatic bile ducts. Attempted to access the central biliary ducts along the path of the stent. While trying to access the central biliary system, the percutaneous access was completely lost. Unfortunately, catheter would not go through the old puncture site. Therefore, ultrasound was used to identify the left hepatic lobe. A 21 gauge needle was directed into a dilated left biliary duct using ultrasound guidance. 0.018 wire was advanced into the biliary system and transitional dilator set was placed. Contrast injection confirmed placement into the biliary system. Eventually, the 5 French catheter was again advanced into the left biliary ducts. Access was again obtained into the more medial left biliary ducts. At this point, the decision was made to proceed with another external biliary drain. Initially, a 20 [place] biliary drain was placed but the drain was too long and the side holes were outside of the liver. Therefore, a 10 [place] multipurpose drain was selected and additional side holes were placed. Unfortunately, too many side holes were placed and there was drainage outside of the liver. Therefore, another 10 [place] multipurpose drain was used and 2 additional side holes were placed. The drain was placed over a stiff Amplatz wire. Contrast injection confirmed that the drain was not leaking outside of the liver. However, there was some extravasation near the tip of the catheter. Due to this area of extravasation, the catheter was slightly  pulled back. Catheter was attached to a gravity bag. Catheter was sutured to skin. New bandage was placed. FINDINGS: Initial scout image demonstrates that a metallic biliary stent has been placed. Stent extends into the left hepatic ducts. 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 The right-sided biliary drain was still sutured to the skin but the tube was just underneath the skin, within the subcutaneous tissues and not within the liver. The right biliary drain was completely removed. Multiple cholangiogram images in the  left hepatic lobe demonstrate an obstructing lesion in the region of the midportion of stent. Catheter and wire could not be advanced into the central bile ducts or common bile duct. However, catheter was advanced into the more medial left hepatic ducts. After access was obtained into the medial left hepatic ducts, there was some contrast flowing into the biliary stent and common bile duct. Common bile duct distal to the stent appears to be very tortuous. Large amount of retained contrast in the gallbladder from the recent ERCP procedure. External biliary drain was placed in left biliary ducts. Additional side holes were placed in order to adequately decompress the left biliary ducts. Small focus of contrast extravasation near the tip of the external biliary drain. As a result, the biliary drain was slightly pulled back. Ultrasound images during the procedure demonstrate a small amount of right perihepatic ascites. IMPRESSION: 1. Cholangiogram demonstrates persistent biliary obstruction with a recently placed metallic stent in the left hepatic lobe. Narrowing in the mid stent is compatible with the underlying lesion. Unable to cross the lesion or obstruction from the left percutaneous access. 2. Percutaneous access was lost in left hepatic lobe while trying to cross the biliary obstruction. As a result, a new left percutaneous drain  was placed. This is a left external biliary drain. 3. Right biliary drain was known to be dislodged and was completely removed. Small amount of perihepatic ascites noted along the right hepatic lobe. 4. Patient was admitted following the procedure due to the concern for infection after recent ERCP procedure and percutaneous procedure. Patient will need a new right percutaneous drain in order to decompress the right hepatic lobe. Electronically Signed   By: Markus Daft M.D.   On: 11/07/2019 17:19        Scheduled Meds: . aspirin EC  81 mg Oral Daily  . carbidopa-levodopa  1 tablet Oral TID  . metoprolol succinate  100 mg Oral Daily  . sodium chloride flush  5 mL Intracatheter Q8H   Continuous Infusions: . sodium chloride 75 mL/hr at 11/08/19 2133  . piperacillin-tazobactam (ZOSYN)  IV 3.375 g (11/09/19 0806)     LOS: 2 days    Time spent:40 min    Jezebel Pollet, Geraldo Docker, MD [facility] Pager [phone]  If 7PM-7AM, please contact night-coverage [email or web address] Password [email or web address] 11/09/2019, 9:41 AM

## 2019-11-10 ENCOUNTER — Inpatient Hospital Stay (HOSPITAL_COMMUNITY): Payer: Medicare Other

## 2019-11-10 DIAGNOSIS — I10 Essential (primary) hypertension: Secondary | ICD-10-CM | POA: Diagnosis not present

## 2019-11-10 DIAGNOSIS — K831 Obstruction of bile duct: Secondary | ICD-10-CM | POA: Diagnosis not present

## 2019-11-10 DIAGNOSIS — N179 Acute kidney failure, unspecified: Secondary | ICD-10-CM | POA: Diagnosis not present

## 2019-11-10 DIAGNOSIS — R600 Localized edema: Secondary | ICD-10-CM | POA: Diagnosis not present

## 2019-11-10 LAB — COMPREHENSIVE METABOLIC PANEL
ALT: 9 U/L (ref 0–44)
AST: 44 U/L — ABNORMAL HIGH (ref 15–41)
Albumin: 1.8 g/dL — ABNORMAL LOW (ref 3.5–5.0)
Alkaline Phosphatase: 132 U/L — ABNORMAL HIGH (ref 38–126)
Anion gap: 8 (ref 5–15)
BUN: 15 mg/dL (ref 8–23)
CO2: 20 mmol/L — ABNORMAL LOW (ref 22–32)
Calcium: 8.3 mg/dL — ABNORMAL LOW (ref 8.9–10.3)
Chloride: 110 mmol/L (ref 98–111)
Creatinine, Ser: 1.06 mg/dL — ABNORMAL HIGH (ref 0.44–1.00)
GFR calc Af Amer: 58 mL/min — ABNORMAL LOW (ref >60)
GFR calc non Af Amer: 50 mL/min — ABNORMAL LOW (ref >60)
Glucose, Bld: 107 mg/dL — ABNORMAL HIGH (ref 70–99)
Potassium: 4 mmol/L (ref 3.5–5.1)
Sodium: 138 mmol/L (ref 135–145)
Total Bilirubin: 7.6 mg/dL — ABNORMAL HIGH (ref 0.3–1.2)
Total Protein: 5.1 g/dL — ABNORMAL LOW (ref 6.5–8.1)

## 2019-11-10 LAB — MAGNESIUM: Magnesium: 2.1 mg/dL (ref 1.7–2.4)

## 2019-11-10 LAB — PROCALCITONIN: Procalcitonin: 0.3 ng/mL

## 2019-11-10 LAB — PHOSPHORUS: Phosphorus: 2.5 mg/dL (ref 2.5–4.6)

## 2019-11-10 NOTE — Progress Notes (Signed)
Interventional Radiology Progress Note  Patient and her family have decided to attempt the next stage of procedure on this admission.   Planning CT performed for upcoming biliary procedure.    VIR to follow  Signed,  Dulcy Fanny. Earleen Newport, DO

## 2019-11-10 NOTE — Progress Notes (Signed)
PROGRESS NOTE    Hailey [name]  [id number] DOB: 1941-02-27 DOA: 11/07/2019 PCP: Prince Solian, MD     Brief Narrative:  Hailey [name] is a 79 y.o. 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    Presents to IR today for evaluation of her external biliary drains as well as ongoing attempts of internalization and possible brush biopsies.  She underwent attempt of internal biliary drain placement 10/18/19, however ultimately left with right-sided external biliary drain placement due to inability to pass the obstruction. She returned 10/24/19 for reattempt to pass the obstruction, however this was unsuccessful and a new left-sided external biliary drain was placed. In the interim she has also undergone several attempts at ERCP to pass the stricture; s/p ERCP 08/27/19, 10/15/19, and repeat yesterday 11/06/19 by GI Dr. Clarene Essex.   Patient underwent cholangiogram through existing drain, attempts repositioning of existing left hepatic drain that required a new puncture due to lost access, placement of new left intrahepatic drain and removal of dislodged right hepatic drain.  Patient's white count elevated however she remains afebrile.  She received IV Zosyn during the procedure.  IR requested to admit the patient due to complicated procedure and elevated white count.  During encounter: Patient lying comfortably on the bed.  Reports that she is doing fine, denies any pain, fever, chills, nausea, vomiting, diarrhea, headache, blurry vision, chest pain, shortness of breath, urinary or bowel changes.  She has chronic leg swelling and she takes Lasix as needed at home.  No history of CHF, orthopnea or PND.  She lives alone.  No history of smoking, alcohol, is a drug use.  Review of Systems: As per HPI otherwise negative.     Subjective: 4/18   A/O x4, negative S OB, negative CP, negative abdominal  pain.    Assessment & Plan:   Principal Problem:   Biliary obstruction Active Problems:   Parkinson's disease ([place])   Hypertension   AKI (acute kidney injury) ([place])   Hyponatremia   Leukocytosis   Normocytic anemia   Bilateral leg edema  Biliary obstruction/Cholangiocarcinoma obstructive -Discussed case with Dr. Nolen Mu GI, and also read DO. Damita Dunnings note IR.  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 -In order to proceed with for definitive diagnostic/therapeutic treatment patient's drains must be first internalized. -Per Dr. Michail Sermon, Dr. Barron Schmid GI is the subject matter expert when it comes to cholangiocarcinoma and this is his patient.  He will notify Dr. Watt Climes patient is here, expect that Dr. Watt Climes and Dr. Earleen Newport will discuss options for definitive treatment on Monday 4/19. -4/16 s/p biliary drain placement see notes below -Continue IV antibiotics -Continue hydration D5-0.9% saline 67ml/hr -4/17 advance diet to full liquid diet -4/17 care of biliary drain  -3 drains to gravity  -Routine drain care for all 3, with routine flush and record output  -Do not submerge   AKI (baseline Cr 0.90) Recent Labs  Lab 11/06/19 0248 11/07/19 0803 11/08/19 0242 11/09/19 0602 11/10/19 0409  CREATININE 1.45* 1.38* 1.34* 1.16* 1.06*  -Creatinine still not at baseline but improving  -Chronic bilateral lower leg swelling -Patient appears euvolemic -Strict in and out -Hold home Lasix 20 mg daily  Hyponatremia -Resolved.  Parkinson disease -Sinemet 25-100 mg 1 tab daily  Essential HTN -Toprol XL 100 mg daily  Normocytic anemia: H&H: 10.8/32.7. -4/16 anemia panel consistent with normocytic anemia -Transfuse for hemoglobin<  DVT prophylaxis:  SCD Code Status: Full Family Communication: 4/17 daughter-in-law at bedside counseled on plan of care answered all questions Disposition Plan:  1.  Where the patient is  from 2.  Anticipated d/c place. 3.  Barriers to d/c does patient have cholangiocarcinoma or not?  Does patient need cancer work-up or not?   Consultants:  IR DO. Liana Crocker phone consult Dr. Nolen Mu GI   Procedures/Significant Events:  4/16 biliary drain placement x3;Exchange of left biliary drain for new 8.75F drain.  (white midline to gravity) -New right external biliary drain placement, with 19F drain.  (blue angiodynamics/exodus, to gravity) -New peritoneal drain for biloma, 19F.   (blue BS/flexima, to gravity)  -Attempt at connection of the left biliary system through the existing biliary stent with gunsight technique, without success.   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 This is causing peritonitis/TTP, and we placed a 19F biloma drain for relief.  -The right and left ducts do not communicate.     I have personally reviewed and interpreted all radiology studies and my findings are as above.  VENTILATOR SETTINGS:    Cultures   Antimicrobials: Anti-infectives (From admission, onward)   Start     Dose/Rate Stop   11/07/19 1800  piperacillin-tazobactam (ZOSYN) IVPB 3.375 g     3.375 g 12.5 mL/hr over 240 Minutes     11/07/19 0815  piperacillin-tazobactam (ZOSYN) IVPB 3.375 g     3.375 g 12.5 mL/hr over 240 Minutes 11/07/19 1617      Devices    LINES / TUBES:      Continuous Infusions: . dextrose 5 % and 0.9% NaCl 75 mL/hr at 11/09/19 2319  . piperacillin-tazobactam (ZOSYN)  IV 3.375 g (11/10/19 0556)     Objective: Vitals:   11/09/19 2023 11/10/19 0443 11/10/19 0500 11/10/19 1506  BP: 137/68 133/61  (!) 141/60  Pulse: 61 (!) 59  (!) 58  Resp:  18  18  Temp: 98.4 F (36.9 C) 98.4 F (36.9 C)  97.6 F (36.4 C)  TempSrc: Oral Oral  Oral  SpO2: 100% 99%  100%  Weight:   68.8 kg   Height:        Intake/Output Summary (Last 24 hours) at 11/10/2019 1528 Last data filed at 11/10/2019 1300 Gross per 24 hour  Intake 875.73 ml  Output  755 ml  Net 120.73 ml   Filed Weights   11/07/19 0802 11/10/19 0500  Weight: 66.7 kg 68.8 kg   Physical Exam:  General: A/O x4, no acute respiratory distress Eyes: negative scleral hemorrhage, negative anisocoria, negative icterus ENT: Negative Runny nose, negative gingival bleeding, Neck:  Negative scars, masses, torticollis, lymphadenopathy, JVD Lungs: Clear to auscultation bilaterally without wheezes or crackles Cardiovascular: Regular rate and rhythm without murmur gallop or rub normal S1 and S2 Abdomen: negative abdominal pain, nondistended, positive soft, bowel sounds, no rebound, no ascites, no appreciable mass, 3 x biliary drainage present (draining serous fluid) Extremities: No significant cyanosis, clubbing, or edema bilateral lower extremities Skin: Positive jaundiced Psychiatric:  Negative depression, negative anxiety, negative fatigue, negative mania  Central nervous system:  Cranial nerves II through XII intact, tongue/uvula midline, all extremities muscle strength 5/5, sensation intact throughout,  negative dysarthria, negative expressive aphasia, negative receptive aphasia.  .     Data Reviewed: Care during the described time interval was provided by me .  I have reviewed this patient's available data, including medical history, events of note, physical examination, and all test results as part of my  evaluation.  CBC: Recent Labs  Lab 11/06/19 0248 11/07/19 0803 11/08/19 0242 11/08/19 1453  WBC 10.9* 19.0* 11.7* 9.2  NEUTROABS  --  17.0* 10.1* 8.1*  HGB 12.0 10.8* 9.6* 10.5*  HCT 36.3 32.7* 29.5* 32.0*  MCV 99.5 98.2 98.0 98.8  PLT 421* 330 287 0000000   Basic Metabolic Panel: Recent Labs  Lab 11/06/19 0248 11/07/19 0803 11/07/19 1433 11/08/19 0242 11/09/19 0602 11/10/19 0409  NA 136 134*  --  136 137 138  K 4.1 5.1  --  4.7 4.1 4.0  CL 105 103  --  107 109 110  CO2 18* 19*  --  21* 20* 20*  GLUCOSE 145* 96  --  84 91 107*  BUN 39* 34*  --  32* 22 15   CREATININE 1.45* 1.38*  --  1.34* 1.16* 1.06*  CALCIUM 9.2 8.9  --  8.5* 8.3* 8.3*  MG  --   --  1.8  --  1.9 2.1  PHOS  --   --   --   --  2.8 2.5   GFR: Estimated Creatinine Clearance: 42.6 mL/min (A) (by C-G formula based on SCr of 1.06 mg/dL (H)). Liver Function Tests: Recent Labs  Lab 11/06/19 0248 11/07/19 0803 11/08/19 0242 11/09/19 0602 11/10/19 0409  AST 67* 44* 42* 50* 44*  ALT 19 15 7 10 9   ALKPHOS 235* 174* 147* 147* 132*  BILITOT 12.3* 13.7* 11.6* 9.7* 7.6*  PROT 6.3* 5.6* 4.9* 5.0* 5.1*  ALBUMIN 2.7* 2.3* 1.9* 1.8* 1.8*   Recent Labs  Lab 11/06/19 0248  LIPASE 30   No results for input(s): AMMONIA in the last 168 hours. Coagulation Profile: Recent Labs  Lab 11/07/19 1433  INR 1.2   Cardiac Enzymes: No results for input(s): CKTOTAL, CKMB, CKMBINDEX, TROPONINI in the last 168 hours. BNP (last 3 results) No results for input(s): PROBNP in the last 8760 hours. HbA1C: No results for input(s): HGBA1C in the last 72 hours. CBG: No results for input(s): GLUCAP in the last 168 hours. Lipid Profile: No results for input(s): CHOL, HDL, LDLCALC, TRIG, CHOLHDL, LDLDIRECT in the last 72 hours. Thyroid Function Tests: No results for input(s): TSH, T4TOTAL, FREET4, T3FREE, THYROIDAB in the last 72 hours. Anemia Panel: Recent Labs    11/08/19 0242  FERRITIN 284  TIBC 244*  IRON 38   Sepsis Labs: Recent Labs  Lab 11/07/19 1433 11/08/19 1453 11/09/19 0602 11/10/19 0409  PROCALCITON  --  0.80 0.67 0.30  LATICACIDVEN 0.8  --   --   --     Recent Results (from the past 240 hour(s))  SARS CORONAVIRUS 2 (TAT 6-24 HRS) Nasopharyngeal Nasopharyngeal Swab     Status: None   Collection Time: 11/02/19  1:28 PM   Specimen: Nasopharyngeal Swab  Result Value Ref Range Status   SARS Coronavirus 2 NEGATIVE NEGATIVE Final    Comment: (NOTE) SARS-CoV-2 target nucleic acids are NOT DETECTED. The SARS-CoV-2 RNA is generally detectable in upper and lower respiratory  specimens during the acute phase of infection. Negative results do not preclude SARS-CoV-2 infection, do not rule out co-infections with other pathogens, and should not be used as the sole basis for treatment or other patient management decisions. Negative results must be combined with clinical observations, patient history, and epidemiological information. The expected result is Negative. Fact Sheet for Patients: SugarRoll.be Fact Sheet for Healthcare Providers: https://www.-mathews.com/ This test is not yet approved or cleared by the [place] and  has been authorized  for detection and/or diagnosis of SARS-CoV-2 by FDA under an Emergency Use Authorization (EUA). This EUA will remain  in effect (meaning this test can be used) for the duration of the COVID-19 declaration under Section 56 4(b)(1) of the Act, 21 U.S.C. section 360bbb-3(b)(1), unless the authorization is terminated or revoked sooner. Performed at [facility], [place] [address], [place], [place] [address]   Culture, blood (routine x 2)     Status: None (Preliminary result)   Collection Time: 11/07/19  2:25 PM   Specimen: BLOOD  Result Value Ref Range Status   Specimen Description BLOOD LEFT ANTECUBITAL  Final   Special Requests   Final    BOTTLES DRAWN AEROBIC AND ANAEROBIC Blood Culture adequate volume   Culture   Final    NO GROWTH 3 DAYS Performed at [facility], [place] [address], [place], [place] [address]    Report Status PENDING  Incomplete  Culture, blood (routine x 2)     Status: None (Preliminary result)   Collection Time: 11/07/19  2:34 PM   Specimen: BLOOD  Result Value Ref Range Status   Specimen Description BLOOD LEFT ANTECUBITAL  Final   Special Requests   Final    BOTTLES DRAWN AEROBIC AND ANAEROBIC Blood Culture adequate volume   Culture   Final    NO GROWTH 3 DAYS Performed at [facility], [place] [address], [place],   [address]    Report Status PENDING  Incomplete         Radiology Studies: No results found.      Scheduled Meds: . aspirin EC  81 mg Oral Daily  . carbidopa-levodopa  1 tablet Oral TID  . metoprolol succinate  100 mg Oral Daily  . sodium chloride flush  5 mL Intracatheter Q8H   Continuous Infusions: . dextrose 5 % and 0.9% NaCl 75 mL/hr at 11/09/19 2319  . piperacillin-tazobactam (ZOSYN)  IV 3.375 g (11/10/19 0556)     LOS: 3 days    Time spent:40 min    Berton Butrick, Geraldo Docker, MD [facility] Pager [phone]  If 7PM-7AM, please contact night-coverage [email or web address] Password [facility] 11/10/2019, 3:28 PM

## 2019-11-10 NOTE — Progress Notes (Signed)
Referring Physician(s): Clarene Essex  Supervising Physician: Corrie Mckusick  Patient Status:  [facility] - In-pt  Chief Complaint: None  Subjective:  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 Vernard Gambles; s/p left external biliary drain placement and right external biliary drain exchange in IR 10/24/2019 by Dr. Annamaria Boots; right external biliary drain was inadvertently removed s/p right external biliary drain placement, left external biliary drain exchange, and right lateral abdomen biloma drain placement in IR 11/09/2019 by Dr. Earleen Newport. Patient awake and alert sitting in bed with no complaints at this time. Appears less jaundiced today, no scleral icterus on exam. Left biliary drain, right biliary drain, and right lateral biloma drain sites c/d/i.   Allergies: Forteo [parathyroid hormone (recomb)] and Merthiolate [thimerosal]  Medications: Prior to Admission medications   Medication Sig Start Date End Date Taking? Authorizing Provider  amoxicillin (AMOXIL) 500 MG capsule Take 2,000 mg by mouth See admin instructions. Take 4 capsules (2000 mg) by mouth one hour prior to dental appointment 07/31/15  Yes [provider]  aspirin EC 81 MG tablet Take 81 mg by mouth daily.   Yes [provider]  Calcium Carbonate-Vitamin D (CALTRATE 600+D) 600-400 MG-UNIT per tablet Take 1 tablet by mouth daily.   Yes [provider]  carbidopa-levodopa (SINEMET IR) 25-100 MG tablet TAKE 1 TABLET BY MOUTH THREE TIMES DAILY, TAKE 4 HOURS APART Patient taking differently: Take 1 tablet by mouth 3 (three) times daily. 0800, 1200, 1600 05/14/19  Yes Lomax, Amy, NP  furosemide (LASIX) 20 MG tablet Take 20 mg by mouth daily.    Yes [provider]  guaiFENesin (MUCINEX) 600 MG 12 hr tablet Take 600 mg by mouth 2 (two) times daily as needed (congestion).   Yes [provider]  metoprolol succinate (TOPROL-XL)  100 MG 24 hr tablet Take 100 mg by mouth daily. Take with or immediately following a meal.   Yes [provider]  oxycodone (OXY-IR) 5 MG capsule Take 1 capsule (5 mg total) by mouth every 8 (eight) hours as needed. Patient taking differently: Take 5 mg by mouth every 8 (eight) hours as needed for pain.  11/06/19  Yes Virgel Manifold, MD  Polyethyl Glycol-Propyl Glycol (SYSTANE OP) Place 1 drop into both eyes daily as needed (dry eyes).   Yes [provider]  vitamin C (ASCORBIC ACID) 250 MG tablet Take 500 mg by mouth daily.   Yes [provider]  Vitamin D, Ergocalciferol, (DRISDOL) 50000 units CAPS capsule Take 50,000 Units by mouth 2 (two) times a week. Tuesdays and Fridays   Yes [provider]     Vital Signs: BP 133/61   Pulse (!) 59   Temp 98.4 F (36.9 C) (Oral)   Resp 18   Ht 5\' 5"  (1.651 m)   Wt 151 lb 10.8 oz (68.8 kg)   SpO2 99%   BMI 25.24 kg/m   Physical Exam Vitals and nursing note reviewed.  Constitutional:      General: She is not in acute distress. Eyes:     General: No scleral icterus. Pulmonary:     Effort: Pulmonary effort is normal. No respiratory distress.  Abdominal:     Comments: Left/right biliary drains and right biloma drain sites without tenderness, erythema, drainage, or active bleeding; Left external biliary drain with approximately 25 cc dark brown/bloody output in gravity bag; Right external biliary drain with approximately 75 cc dark brown/bloody output in gravity  bag; Right lateral abdomen biloma drain with approximately 10-15 cc dark bile in gravity bag.   Skin:    General: Skin is warm and dry.     Coloration: Skin is jaundiced.  Neurological:     Mental Status: She is alert.     Imaging: DG ERCP BILIARY & PANCREATIC DUCTS  Result Date: 11/07/2019 CLINICAL DATA:  79 year old female with biliary obstruction EXAM: ERCP TECHNIQUE: Multiple spot images obtained with the fluoroscopic device and submitted for  interpretation post-procedure. FLUOROSCOPY TIME:  Fluoroscopy Time:  26 minutes 44 seconds COMPARISON:  CT 11/06/2019, fluoro 10/18/2019, 10/24/2019 FINDINGS: Limited intraoperative fluoroscopic spot images during ERCP. Initial image demonstrates the endoscope projecting over the upper abdomen with safety wire in the common bile duct. Partial opacification of the extrahepatic biliary ducts. Subsequently there is deployment of both a retrieval balloon as well as a metallic biliary stent at the site of stricture/occlusion. IMPRESSION: Limited intraoperative fluoroscopic spot images demonstrate treatment of biliary obstruction with deployment of both retrieval balloon as well as metallic biliary stent. Please refer to the dictated operative report for full details of intraoperative findings and procedure. Electronically Signed   By: Corrie Mckusick D.O.   On: 11/07/2019 07:30   IR CHOLANGIOGRAM EXISTING TUBE  Result Date: 11/07/2019 INDICATION: 79 year old with biliary obstruction and undergone multiple procedures for biliary decompression. The patient has had bilateral external biliary drains. Recent CT demonstrated that the right biliary drain was dislodged. Patient underwent ERCP and stent placement on 11/06/2019. Patient presents for repeat cholangiogram through existing catheter and attempt at internalization of a biliary drain. EXAM: 1. CHOLANGIOGRAM THROUGH EXISTING DRAIN 2. PERCUTANEOUS TRANSHEPATIC CHOLANGIOGRAM WITH ULTRASOUND AND FLUOROSCOPIC GUIDANCE AND PLACEMENT OF NEW LEFT BILIARY DRAIN 3. REMOVAL OF THE DISLODGED RIGHT BILIARY DRAIN MEDICATIONS: Zosyn 3.375 g; The antibiotic was administered within an appropriate time frame prior to the initiation of the procedure. ANESTHESIA/SEDATION: Moderate (conscious) sedation was employed during this procedure. A total of Versed 2.0 mg and Fentanyl 100 mcg was administered intravenously. Moderate Sedation Time: 90 minutes. The patient's level of consciousness  and vital signs were monitored continuously by radiology nursing throughout the procedure under my direct supervision. FLUOROSCOPY TIME:  Fluoroscopy Time: 27 minutes, 42 seconds, [id number] mGy COMPLICATIONS: None immediate. PROCEDURE: Informed written consent was obtained from the patient after a thorough discussion of the procedural risks, benefits and alternatives. All questions were addressed. Maximal Sterile Barrier Technique was utilized including caps, mask, sterile gowns, sterile gloves, sterile drape, hand hygiene and skin antiseptic. A timeout was performed prior to the initiation of the procedure. Patient was placed supine on the interventional table. Both drains were prepped and draped in sterile fashion. Scout image of the abdomen was obtained. Left drain was injected with contrast. The skin around the drain was anesthetized with 1% lidocaine. The retention suture was removed. The catheter was cut and removed over a Bentson wire. Five [place] Kumpe catheter was placed. Additional cholangiogram images were obtained. Eventually, a Roadrunner wire was advanced into branches along the right side of the metallic biliary stent. These branches were not opacifying during the initial contrast injection. A Glide catheter was advanced into these branches and demonstrated additional dilated bile ducts. These most likely represent medial left hepatic bile ducts. Attempted to access the central biliary ducts along the path of the stent. While trying to access the central biliary system, the percutaneous access was completely lost. Unfortunately, catheter would not go through the old puncture site. Therefore, ultrasound was used to identify  the left hepatic lobe. A 21 gauge needle was directed into a dilated left biliary duct using ultrasound guidance. 0.018 wire was advanced into the biliary system and transitional dilator set was placed. Contrast injection confirmed placement into the biliary system. Eventually, the 5  French catheter was again advanced into the left biliary ducts. Access was again obtained into the more medial left biliary ducts. At this point, the decision was made to proceed with another external biliary drain. Initially, a 93 [place] biliary drain was placed but the drain was too long and the side holes were outside of the liver. Therefore, a 10 [place] multipurpose drain was selected and additional side holes were placed. Unfortunately, too many side holes were placed and there was drainage outside of the liver. Therefore, another 10 [place] multipurpose drain was used and 2 additional side holes were placed. The drain was placed over a stiff Amplatz wire. Contrast injection confirmed that the drain was not leaking outside of the liver. However, there was some extravasation near the tip of the catheter. Due to this area of extravasation, the catheter was slightly pulled back. Catheter was attached to a gravity bag. Catheter was sutured to skin. New bandage was placed. FINDINGS: Initial scout image demonstrates that a metallic biliary stent has been placed. Stent extends into the left hepatic ducts. 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 The right-sided biliary drain was still sutured to the skin but the tube was just underneath the skin, within the subcutaneous tissues and not within the liver. The right biliary drain was completely removed. Multiple cholangiogram images in the left hepatic lobe demonstrate an obstructing lesion in the region of the midportion of stent. Catheter and wire could not be advanced into the central bile ducts or common bile duct. However, catheter was advanced into the more medial left hepatic ducts. After access was obtained into the medial left hepatic ducts, there was some contrast flowing into the biliary stent and common bile duct. Common bile duct distal to the stent appears to be very tortuous. Large amount of  retained contrast in the gallbladder from the recent ERCP procedure. External biliary drain was placed in left biliary ducts. Additional side holes were placed in order to adequately decompress the left biliary ducts. Small focus of contrast extravasation near the tip of the external biliary drain. As a result, the biliary drain was slightly pulled back. Ultrasound images during the procedure demonstrate a small amount of right perihepatic ascites. IMPRESSION: 1. Cholangiogram demonstrates persistent biliary obstruction with a recently placed metallic stent in the left hepatic lobe. Narrowing in the mid stent is compatible with the underlying lesion. Unable to cross the lesion or obstruction from the left percutaneous access. 2. Percutaneous access was lost in left hepatic lobe while trying to cross the biliary obstruction. As a result, a new left percutaneous drain was placed. This is a left external biliary drain. 3. Right biliary drain was known to be dislodged and was completely removed. Small amount of perihepatic ascites noted along the right hepatic lobe. 4. Patient was admitted following the procedure due to the concern for infection after recent ERCP procedure and percutaneous procedure. Patient will need a new right percutaneous drain in order to decompress the right hepatic lobe. Electronically Signed   By: Markus Daft M.D.   On: 11/07/2019 17:19   IR BILIARY DRAIN PLACEMENT WITH CHOLANGIOGRAM  Result Date: 11/08/2019 INDICATION: 79 year old female with a history of  Klatskin tumor with prior right-sided and left-sided biliary drainage catheters. 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 The right-sided drain has been previously accidentally displaced and remains undrained currently. EXAM: IMAGE GUIDED EXCHANGE OF LEFT-SIDED DRAINAGE CATHETER IMAGE GUIDED PLACEMENT OF EXTERNAL RIGHT-SIDED BILIARY DRAIN IMAGE GUIDED PLACEMENT  OF BILOMA DRAIN OF THE UPPER ABDOMEN ATTEMPT AT GUN SITE TECHNIQUE CONNECTION OF THE INDWELLING STENT AND THE LEFT-SIDED BILIARY DUCTS WHICH WAS ULTIMATELY UNSUCCESSFUL MEDICATIONS: None ANESTHESIA/SEDATION: Moderate (conscious) sedation was employed during this procedure. A total of Versed 4.0 mg and Fentanyl 200 mcg was administered intravenously. Moderate Sedation Time: 3 hours, 43 minutes. The patient's level of consciousness and vital signs were monitored continuously by radiology nursing throughout the procedure under my direct supervision. FLUOROSCOPY TIME:  Fluoroscopy Time: 46 minutes 54 seconds (966 mGy). COMPLICATIONS: None PROCEDURE: Informed written consent was obtained from the patient after a thorough discussion of the procedural risks, benefits and alternatives. All questions were addressed. Maximal Sterile Barrier Technique was utilized including caps, mask, sterile gowns, sterile gloves, sterile drape, hand hygiene and skin antiseptic. A timeout was performed prior to the initiation of the procedure. Patient positioned supine position on the fluoroscopy table. Scout images were acquired. The left-sided drain was injected with contrast confirming location and was removed on a Bentson wire. Short Kumpe the catheter was placed confirming contrast in the left-sided biliary ducts. These do not communicate with the stented system. Six French sheath was then placed in the biliary system. Using the angled catheter we attempted to navigate a wire through the tines of the stent. This was unsuccessful. The back end of both a standard Glidewire and a stiff Glidewire were used to try to penetrate the tines of the stent, unsuccessful. We then proceeded with attempt at gun site technique to connect the undrained biliary system with the drain biliary system. 1% lidocaine was used for local anesthesia. A Chiba needle was used for a percutaneous access to the stented biliary system. Once we confirmed needle tip  position, 018 V18 wire was advanced into the stent. A quick cross catheter was successful in crossing the tines of the stent into the biliary system. At this point we recognize that are previously existing left-sided biliary drains had withdrawn with the respiration. Ultrasound-guided access into the left-sided stents was then performed, using modified Seldinger technique. The 6 French sheath was successfully replaced into the left-sided ducts. A micro snare was passed percutaneously into the biliary duct, with an ensnare passed through the 6 French sheath. Ultimately the micro snare was too small for targeting. Up sizing to a 4 [place] [employer] micro puncture was performed. We were then able to pass a 15 mm snare through the 4 French sheath and aligned the 2 snares for the gun site technique. Chiba needle was then passed through the 2 snares using fluoroscopy and oblique imaging. Once we confirmed needle tip position, the 18 wire was passed through the needle and the needle was removed. The 6 French sheath was closed and withdrawn wall simultaneously withdrawing the 15 mm gooseneck snare within the bile stent. Withdrawing the wire was performed without fluoroscopy, and the next fluoro image demonstrated that the snare within the stent system failed to engage the wire. The wire was externalized through the 6 French sheath. Once we recognized that the intraductal snare did not capture the wire, this system was removed from the biliary system. We then simply replaced the left-sided  drain with a 10 French pigtail drainage catheter. We then proceeded with right-sided drainage. Ultrasound survey of the right liver was performed, with then ultrasound of the right liver lobe. Window into the right biliary system was present for the right liver approach. Sizable fluid collection on the liver was also demonstrated. 1% lidocaine was used for local anesthesia, with generous infiltration of the skin and subcutaneous  tissues in and inter left costal location. A Chiba needle was advanced under ultrasound guidance into the right liver lobe. Once the tip of the needle was confirmed within the biliary system by injecting small aliquots of contrast, images were stored of the biliary system after partially opacifying the biliary tree via the needle. Once the tip of this needle was confirmed within the biliary system, an 018 wire was advanced centrally. The needle was removed, a small incision was made with an 11 blade scalpel, and then a triaxial Accustick system was advanced into the biliary system. The metal stiffener and dilator were removed, we confirmed placement with contrast infusion. 035 wire was placed. Dilation was performed with 10 [place] dilator, and then a 10 French pigtail catheter was advanced into the right-sided system. Catheter was formed into the right-sided ducts draining the right-sided ductal system. There was no communication with the stented system. Finally, given the patient's tenderness and peritoneal signs on palpation, we determined that eighth biloma drain was indicated. Ultrasound-guided placement of a 10 French biloma drain into the right abdomen was performed using standard modified Seldinger technique. This 10 French catheter was placed and sutured in position, attached to gravity drainage. The patient tolerated the procedure well and remained hemodynamically stable throughout. No complications were encountered and no significant blood loss was encountered. FINDINGS: Failed gun site technique attempt at connecting the stented biliary system with the left-sided ductal system. Exchange of left externalized biliary drain for a new 8 [place] drain to gravity. Placement of a new right-sided externalized biliary drain into the right-sided ducks, 10 [place] drain. Placement of biloma drain into the lateral right abdomen. IMPRESSION: Status post failed gun site technique puncture for attempted connection of the  undrained left-sided biliary ductal system with the stented central ductal system. Status post exchange of left-sided ductal system with new 8 French drain, placement of new right-sided biliary externalized drainage catheter, as well as biloma drain in the upper abdomen. Signed, Dulcy Fanny. Dellia Nims, RPVI [facility] Electronically Signed   By: Corrie Mckusick D.O.   On: 11/08/2019 16:27   IR BILIARY DRAIN PLACEMENT WITH CHOLANGIOGRAM  Result Date: 11/07/2019 INDICATION: 79 year old with biliary obstruction and undergone multiple procedures for biliary decompression. The patient has had bilateral external biliary drains. Recent CT demonstrated that the right biliary drain was dislodged. Patient underwent ERCP and stent placement on 11/06/2019. Patient presents for repeat cholangiogram through existing catheter and attempt at internalization of a biliary drain. EXAM: 1. CHOLANGIOGRAM THROUGH EXISTING DRAIN 2. PERCUTANEOUS TRANSHEPATIC CHOLANGIOGRAM WITH ULTRASOUND AND FLUOROSCOPIC GUIDANCE AND PLACEMENT OF NEW LEFT BILIARY DRAIN 3. REMOVAL OF THE DISLODGED RIGHT BILIARY DRAIN MEDICATIONS: Zosyn 3.375 g; The antibiotic was administered within an appropriate time frame prior to the initiation of the procedure. ANESTHESIA/SEDATION: Moderate (conscious) sedation was employed during this procedure. A total of Versed 2.0 mg and Fentanyl 100 mcg was administered intravenously. Moderate Sedation Time: 90 minutes. The patient's level of consciousness and vital signs were monitored continuously by radiology nursing throughout the procedure under my direct supervision. FLUOROSCOPY TIME:  Fluoroscopy  Time: 27 minutes, 42 seconds, [id number] mGy COMPLICATIONS: None immediate. PROCEDURE: Informed written consent was obtained from the patient after a thorough discussion of the procedural risks, benefits and alternatives. All questions were addressed. Maximal Sterile Barrier  Technique was utilized including caps, mask, sterile gowns, sterile gloves, sterile drape, hand hygiene and skin antiseptic. A timeout was performed prior to the initiation of the procedure. Patient was placed supine on the interventional table. Both drains were prepped and draped in sterile fashion. Scout image of the abdomen was obtained. Left drain was injected with contrast. The skin around the drain was anesthetized with 1% lidocaine. The retention suture was removed. The catheter was cut and removed over a Bentson wire. Five [place] Kumpe catheter was placed. Additional cholangiogram images were obtained. Eventually, a Roadrunner wire was advanced into branches along the right side of the metallic biliary stent. These branches were not opacifying during the initial contrast injection. A Glide catheter was advanced into these branches and demonstrated additional dilated bile ducts. These most likely represent medial left hepatic bile ducts. Attempted to access the central biliary ducts along the path of the stent. While trying to access the central biliary system, the percutaneous access was completely lost. Unfortunately, catheter would not go through the old puncture site. Therefore, ultrasound was used to identify the left hepatic lobe. A 21 gauge needle was directed into a dilated left biliary duct using ultrasound guidance. 0.018 wire was advanced into the biliary system and transitional dilator set was placed. Contrast injection confirmed placement into the biliary system. Eventually, the 5 French catheter was again advanced into the left biliary ducts. Access was again obtained into the more medial left biliary ducts. At this point, the decision was made to proceed with another external biliary drain. Initially, a 29 [place] biliary drain was placed but the drain was too long and the side holes were outside of the liver. Therefore, a 10 [place] multipurpose drain was selected and additional side holes were  placed. Unfortunately, too many side holes were placed and there was drainage outside of the liver. Therefore, another 10 [place] multipurpose drain was used and 2 additional side holes were placed. The drain was placed over a stiff Amplatz wire. Contrast injection confirmed that the drain was not leaking outside of the liver. However, there was some extravasation near the tip of the catheter. Due to this area of extravasation, the catheter was slightly pulled back. Catheter was attached to a gravity bag. Catheter was sutured to skin. New bandage was placed. FINDINGS: Initial scout image demonstrates that a metallic biliary stent has been placed. Stent extends into the left hepatic ducts. 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 The right-sided biliary drain was still sutured to the skin but the tube was just underneath the skin, within the subcutaneous tissues and not within the liver. The right biliary drain was completely removed. Multiple cholangiogram images in the left hepatic lobe demonstrate an obstructing lesion in the region of the midportion of stent. Catheter and wire could not be advanced into the central bile ducts or common bile duct. However, catheter was advanced into the more medial left hepatic ducts. After access was obtained into the medial left hepatic ducts, there was some contrast flowing into the biliary stent and common bile duct. Common bile duct distal to the stent appears to be very tortuous. Large amount of retained contrast in the gallbladder from the recent ERCP procedure. External biliary  drain was placed in left biliary ducts. Additional side holes were placed in order to adequately decompress the left biliary ducts. Small focus of contrast extravasation near the tip of the external biliary drain. As a result, the biliary drain was slightly pulled back. Ultrasound images during the procedure demonstrate a small amount  of right perihepatic ascites. IMPRESSION: 1. Cholangiogram demonstrates persistent biliary obstruction with a recently placed metallic stent in the left hepatic lobe. Narrowing in the mid stent is compatible with the underlying lesion. Unable to cross the lesion or obstruction from the left percutaneous access. 2. Percutaneous access was lost in left hepatic lobe while trying to cross the biliary obstruction. As a result, a new left percutaneous drain was placed. This is a left external biliary drain. 3. Right biliary drain was known to be dislodged and was completely removed. Small amount of perihepatic ascites noted along the right hepatic lobe. 4. Patient was admitted following the procedure due to the concern for infection after recent ERCP procedure and percutaneous procedure. Patient will need a new right percutaneous drain in order to decompress the right hepatic lobe. Electronically Signed   By: Markus Daft M.D.   On: 11/07/2019 17:19    Labs:  CBC: Recent Labs    11/06/19 0248 11/07/19 0803 11/08/19 0242 11/08/19 1453  WBC 10.9* 19.0* 11.7* 9.2  HGB 12.0 10.8* 9.6* 10.5*  HCT 36.3 32.7* 29.5* 32.0*  PLT 421* 330 287 324    COAGS: Recent Labs    10/18/19 1020 10/24/19 0833 11/07/19 1433  INR 1.0 1.0 1.2  APTT  --   --  38*    BMP: Recent Labs    11/07/19 0803 11/08/19 0242 11/09/19 0602 11/10/19 0409  NA 134* 136 137 138  K 5.1 4.7 4.1 4.0  CL 103 107 109 110  CO2 19* 21* 20* 20*  GLUCOSE 96 84 91 107*  BUN 34* 32* 22 15  CALCIUM 8.9 8.5* 8.3* 8.3*  CREATININE 1.38* 1.34* 1.16* 1.06*  GFRNONAA 37* 38* 45* 50*  GFRAA 42* 44* 52* 58*    LIVER FUNCTION TESTS: Recent Labs    11/07/19 0803 11/08/19 0242 11/09/19 0602 11/10/19 0409  BILITOT 13.7* 11.6* 9.7* 7.6*  AST 44* 42* 50* 44*  ALT 15 7 10 9   ALKPHOS 174* 147* 147* 132*  PROT 5.6* 4.9* 5.0* 5.1*  ALBUMIN 2.3* 1.9* 1.8* 1.8*    Assessment and Plan:  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 Vernard Gambles; s/p left external biliary drain placement and right external biliary drain exchange in IR 10/24/2019 by Dr. Annamaria Boots; right external biliary drain was inadvertently removed s/p right external biliary drain placement, left external biliary drain exchange, and right lateral abdomen biloma drain placement in IR 11/09/2019 by Dr. Earleen Newport. Left external biliary drain stable with approximately 25 cc dark brown/bloody output in gravity bag. Right external biliary drain stable with approximately 75 cc dark brown/bloody output in gravity bag. Right lateral abdomen biloma drain stable with approximately 10-15 cc dark bile in gravity bag. Tbili down to 7.6 (from 9.7 yesterday). Dr. Earleen Newport discussed possibility of re-attempt to internalize biliary drains during this hospitalization versus close clinic follow-up upon discharge, with possible re-attempt at internalization of biliary drains in future. Patient wishes to discuss with her sister-in-law before making a decision. Dr. Earleen Newport to call patient's sister-in-law to discuss. If patient wishes to proceed with possible re-attempt, will obtain CT abdomen/pelvis prior to  evaluate biliary system. Continue current drain management- continue with Qshift flushes/monitor of output. Further plans per TRH/GI- appreciate and agree with management. IR to follow.   Electronically Signed: Earley Abide, PA-C 11/10/2019, 10:03 AM   I spent a total of 25 Minutes at the the patient's bedside AND on the patient's hospital floor or unit, greater than 50% of which was counseling/coordinating care for biliary obstruction s/p biliary drain placements.

## 2019-11-10 NOTE — Progress Notes (Signed)
Pharmacy Antibiotic Note  Hailey [name] is a 79 y.o. female admitted on 11/07/2019 with complex intra-abdominal infection.  Pharmacy has been consulted for piperacillin/tazobactam dosing.  Patient with biliary obstruction s/p 4/15 ERCP with stent placement, extravasated contrast around new L intrahepatic duct drain with plans for another R hepatic drain later. WBC 19>9.2, afebrile, Scr 1.38>1.06, patient afebrile.  Plan: Continue zosyn 3.375g IV every 8 hr Monitor cultures, clinical status, renal fx Narrow abx as able and f/u duration   Height: 5\' 5"  (165.1 cm) Weight: 68.8 kg (151 lb 10.8 oz) IBW/kg (Calculated) : 57  Temp (24hrs), Avg:98.5 F (36.9 C), Min:98 F (36.7 C), Max:99 F (37.2 C)  Recent Labs  Lab 11/06/19 0248 11/07/19 0803 11/07/19 1433 11/08/19 0242 11/08/19 1453 11/09/19 0602 11/10/19 0409  WBC 10.9* 19.0*  --  11.7* 9.2  --   --   CREATININE 1.45* 1.38*  --  1.34*  --  1.16* 1.06*  LATICACIDVEN  --   --  0.8  --   --   --   --     Estimated Creatinine Clearance: 42.6 mL/min (A) (by C-G formula based on SCr of 1.06 mg/dL (H)).     Antimicrobials this admission: Pip/tazo 4/16 >>  Cipro 4/14   Microbiology results: 4/15 BCx: ngtd  Thank you for the interesting consult and for involving pharmacy in this patient's care.  Tamela Gammon, PharmD, BCPS 11/10/2019 8:43 AM PGY-2 Pharmacy Administration Resident Please check [email or web address] for unit-specific pharmacist phone numbers

## 2019-11-11 DIAGNOSIS — R600 Localized edema: Secondary | ICD-10-CM | POA: Diagnosis not present

## 2019-11-11 DIAGNOSIS — I10 Essential (primary) hypertension: Secondary | ICD-10-CM | POA: Diagnosis not present

## 2019-11-11 DIAGNOSIS — N179 Acute kidney failure, unspecified: Secondary | ICD-10-CM | POA: Diagnosis not present

## 2019-11-11 DIAGNOSIS — R7989 Other specified abnormal findings of blood chemistry: Secondary | ICD-10-CM | POA: Diagnosis present

## 2019-11-11 DIAGNOSIS — K831 Obstruction of bile duct: Secondary | ICD-10-CM | POA: Diagnosis not present

## 2019-11-11 LAB — COMPREHENSIVE METABOLIC PANEL
ALT: 7 U/L (ref 0–44)
AST: 47 U/L — ABNORMAL HIGH (ref 15–41)
Albumin: 1.8 g/dL — ABNORMAL LOW (ref 3.5–5.0)
Alkaline Phosphatase: 161 U/L — ABNORMAL HIGH (ref 38–126)
Anion gap: 9 (ref 5–15)
BUN: 10 mg/dL (ref 8–23)
CO2: 19 mmol/L — ABNORMAL LOW (ref 22–32)
Calcium: 8.2 mg/dL — ABNORMAL LOW (ref 8.9–10.3)
Chloride: 112 mmol/L — ABNORMAL HIGH (ref 98–111)
Creatinine, Ser: 0.96 mg/dL (ref 0.44–1.00)
GFR calc Af Amer: >60 mL/min (ref >60)
GFR calc non Af Amer: 57 mL/min — ABNORMAL LOW (ref >60)
Glucose, Bld: 113 mg/dL — ABNORMAL HIGH (ref 70–99)
Potassium: 3.5 mmol/L (ref 3.5–5.1)
Sodium: 140 mmol/L (ref 135–145)
Total Bilirubin: 7 mg/dL — ABNORMAL HIGH (ref 0.3–1.2)
Total Protein: 4.9 g/dL — ABNORMAL LOW (ref 6.5–8.1)

## 2019-11-11 LAB — PHOSPHORUS: Phosphorus: 2.1 mg/dL — ABNORMAL LOW (ref 2.5–4.6)

## 2019-11-11 LAB — CBC
HCT: 31 % — ABNORMAL LOW (ref 36.0–46.0)
Hemoglobin: 10.1 g/dL — ABNORMAL LOW (ref 12.0–15.0)
MCH: 31.7 pg (ref 26.0–34.0)
MCHC: 32.6 g/dL (ref 30.0–36.0)
MCV: 97.2 fL (ref 80.0–100.0)
Platelets: 350 10*3/uL (ref 150–400)
RBC: 3.19 MIL/uL — ABNORMAL LOW (ref 3.87–5.11)
RDW: 15.9 % — ABNORMAL HIGH (ref 11.5–15.5)
WBC: 7.2 10*3/uL (ref 4.0–10.5)
nRBC: 0 % (ref 0.0–0.2)

## 2019-11-11 LAB — AMMONIA: Ammonia: 51 umol/L — ABNORMAL HIGH (ref 9–35)

## 2019-11-11 LAB — MAGNESIUM: Magnesium: 2 mg/dL (ref 1.7–2.4)

## 2019-11-11 MED ORDER — LACTULOSE 10 GM/15ML PO SOLN
30.0000 g | Freq: Two times a day (BID) | ORAL | Status: DC
  Administered 2019-11-11 – 2019-11-16 (×7): 30 g via ORAL
  Filled 2019-11-11 (×9): qty 45

## 2019-11-11 NOTE — Progress Notes (Addendum)
Referring Physician(s): Clarene Essex  Supervising Physician: Markus Daft  Patient Status:  [facility] - In-pt  Chief Complaint: Follow up right/left external biliary drains and biloma drain  Subjective:  Patient laying in bed, no family or staff at bedside this morning. She tells me she is in a hotel and "needs to get up and get my purse so I can check out." She is unable to tell me her birthday, states the year is 2020, the month is April and day of the week is Tuesday. She is surprised to see that she has drains and cannot tell me why she has these or why she would be in the hospital. She denies any abdominal pain but does tell me she has left shoulder pain which is chronic and that she needs a replacement.   Allergies: Forteo [parathyroid hormone (recomb)] and Merthiolate [thimerosal]  Medications: Prior to Admission medications   Medication Sig Start Date End Date Taking? Authorizing Provider  amoxicillin (AMOXIL) 500 MG capsule Take 2,000 mg by mouth See admin instructions. Take 4 capsules (2000 mg) by mouth one hour prior to dental appointment 07/31/15  Yes [provider]  aspirin EC 81 MG tablet Take 81 mg by mouth daily.   Yes [provider]  Calcium Carbonate-Vitamin D (CALTRATE 600+D) 600-400 MG-UNIT per tablet Take 1 tablet by mouth daily.   Yes [provider]  carbidopa-levodopa (SINEMET IR) 25-100 MG tablet TAKE 1 TABLET BY MOUTH THREE TIMES DAILY, TAKE 4 HOURS APART Patient taking differently: Take 1 tablet by mouth 3 (three) times daily. 0800, 1200, 1600 05/14/19  Yes Lomax, Amy, NP  furosemide (LASIX) 20 MG tablet Take 20 mg by mouth daily.    Yes [provider]  guaiFENesin (MUCINEX) 600 MG 12 hr tablet Take 600 mg by mouth 2 (two) times daily as needed (congestion).   Yes [provider]  metoprolol succinate (TOPROL-XL) 100 MG 24 hr tablet Take 100 mg by mouth daily. Take with or immediately following a meal.   Yes [provider]  oxycodone (OXY-IR) 5 MG capsule Take 1 capsule (5 mg total) by mouth every 8 (eight) hours as needed. Patient taking differently: Take 5 mg by mouth every 8 (eight) hours as needed for pain.  11/06/19  Yes Virgel Manifold, MD  Polyethyl Glycol-Propyl Glycol (SYSTANE OP) Place 1 drop into both eyes daily as needed (dry eyes).   Yes [provider]  vitamin C (ASCORBIC ACID) 250 MG tablet Take 500 mg by mouth daily.   Yes [provider]  Vitamin D, Ergocalciferol, (DRISDOL) 50000 units CAPS capsule Take 50,000 Units by mouth 2 (two) times a week. Tuesdays and Fridays   Yes [provider]     Vital Signs: BP (!) 155/60 (BP Location: Left Arm)   Pulse 67   Temp 98 F (36.7 C) (Oral)   Resp 17   Ht 5\' 5"  (1.651 m)   Wt 157 lb 13.6 oz (71.6 kg)   SpO2 99%   BMI 26.27 kg/m   Physical Exam Vitals and nursing note reviewed.  Constitutional:      General: She is not in acute distress. HENT:     Head: Normocephalic.  Eyes:     General: Scleral icterus present.  Cardiovascular:     Rate and Rhythm: Normal rate and regular rhythm.  Pulmonary:     Effort: Pulmonary effort is normal.     Breath sounds: Normal breath sounds.  Abdominal:  Palpations: Abdomen is soft.     Tenderness: There is abdominal tenderness (minimal at drain sites).     Comments: (+) left biliary drain (8.5 Fr) with ~50 cc light bloody output, no issues with flushing, insertion site unremarkable. (+) right biliary drain (10 Fr) with ~100 cc dark bloody output, no issues with flushing, insertion site unremarkable (+) RUQ drain (10 Fr) with ~25 cc clear, bilious output, no issues with flushing, insertion site unremarkable.  Skin:    General: Skin is warm and dry.     Coloration: Skin is jaundiced.  Neurological:     Mental Status: She is alert. She is disoriented.     Imaging: CT ABDOMEN WO CONTRAST  Result Date: 11/11/2019 CLINICAL DATA:  79 year old with biliary  obstruction and status post percutaneous drains. In addition, the patient has a perihepatic drainage catheter. EXAM: CT ABDOMEN WITHOUT CONTRAST TECHNIQUE: Multidetector CT imaging of the abdomen was performed following the standard protocol without IV contrast. COMPARISON:  CT 11/06/2019 FINDINGS: Lower chest: Patient has developed a small right pleural effusion. Small amount of compressive atelectasis at the right lung base. Hepatobiliary: Currently, there is a left external biliary drain and right external biliary drain. There is a metallic stent in the region of the common hepatic duct that extends into the left biliary system. There is pneumobilia associated with this metallic biliary stent and the associated left biliary duct. Lack of IV contrast limits evaluation of the biliary system but there continues to be evidence for intrahepatic biliary dilatation. Narrowing of the metallic biliary stent near the central aspect of the liver compatible with an underlying liver or biliary lesion. There is a percutaneous drain adjacent to the lateral aspect of the right hepatic lobe. Small residual fluid collection along the inferior aspect of the drain and inferior aspect of the right hepatic lobe. Craniocaudal dimension of the collection is not completely visualized but the collection measures greater than 4.8 cm in the craniocaudal dimension. Collection measures 3.9 x 2.2 cm on the axial images, sequence 3, image 43. Pancreas: Stable appearance of the pancreas without significant duct dilatation. Again noted is a round low-density structure just anterior to the pancreatic body and neck region on sequence 3, image 24. This is similar to previous examinations and measures 2.3 x 3.2 cm. Spleen: Normal in size without focal abnormality. Adrenals/Urinary Tract: Normal adrenal glands. Normal appearance of both kidneys without hydronephrosis. Stomach/Bowel: Normal appearance of stomach. Visualized small and large bowel are  unremarkable. No evidence for bowel obstruction. Vascular/Lymphatic: Atherosclerotic calcifications in the abdominal aorta without aneurysm. Other: Small pocket of intraperitoneal air anterior to the left hepatic lobe on sequence 3, image 21. This area is near the left hepatic drain and likely associated with the drain. Musculoskeletal: Facet arthropathy in the lower lumbar spine. IMPRESSION: 1. Bilateral biliary drains are in place. These are external biliary drains that do not extend into the extrahepatic biliary system or duodenum. Evidence for residual intrahepatic biliary dilatation. 2. Metallic biliary stent is located in the common hepatic duct and extends into lateral left hepatic lobe. There is pneumobilia associated with this biliary stent. Marked narrowing in the midportion of stent compatible with underlying biliary or liver lesion. 3. Residual fluid collection in the right inferior perihepatic space. This fluid collection is just caudal to the percutaneous drain and may not be communicating with the drain. 4. Stable rounded lesion anterior to the pancreas. This has been described on previous examinations and may represent a pseudo cyst but indeterminate.  5. New small right pleural effusion. Electronically Signed   By: Markus Daft M.D.   On: 11/11/2019 08:22   IR CHOLANGIOGRAM EXISTING TUBE  Result Date: 11/07/2019 INDICATION: 79 year old with biliary obstruction and undergone multiple procedures for biliary decompression. The patient has had bilateral external biliary drains. Recent CT demonstrated that the right biliary drain was dislodged. Patient underwent ERCP and stent placement on 11/06/2019. Patient presents for repeat cholangiogram through existing catheter and attempt at internalization of a biliary drain. EXAM: 1. CHOLANGIOGRAM THROUGH EXISTING DRAIN 2. PERCUTANEOUS TRANSHEPATIC CHOLANGIOGRAM WITH ULTRASOUND AND FLUOROSCOPIC GUIDANCE AND PLACEMENT OF NEW LEFT BILIARY DRAIN 3. REMOVAL OF THE  DISLODGED RIGHT BILIARY DRAIN MEDICATIONS: Zosyn 3.375 g; The antibiotic was administered within an appropriate time frame prior to the initiation of the procedure. ANESTHESIA/SEDATION: Moderate (conscious) sedation was employed during this procedure. A total of Versed 2.0 mg and Fentanyl 100 mcg was administered intravenously. Moderate Sedation Time: 90 minutes. The patient's level of consciousness and vital signs were monitored continuously by radiology nursing throughout the procedure under my direct supervision. FLUOROSCOPY TIME:  Fluoroscopy Time: 27 minutes, 42 seconds, XX123456 mGy COMPLICATIONS: None immediate. PROCEDURE: Informed written consent was obtained from the patient after a thorough discussion of the procedural risks, benefits and alternatives. All questions were addressed. Maximal Sterile Barrier Technique was utilized including caps, mask, sterile gowns, sterile gloves, sterile drape, hand hygiene and skin antiseptic. A timeout was performed prior to the initiation of the procedure. Patient was placed supine on the interventional table. Both drains were prepped and draped in sterile fashion. Scout image of the abdomen was obtained. Left drain was injected with contrast. The skin around the drain was anesthetized with 1% lidocaine. The retention suture was removed. The catheter was cut and removed over a Bentson wire. Five [place] Kumpe catheter was placed. Additional cholangiogram images were obtained. Eventually, a Roadrunner wire was advanced into branches along the right side of the metallic biliary stent. These branches were not opacifying during the initial contrast injection. A Glide catheter was advanced into these branches and demonstrated additional dilated bile ducts. These most likely represent medial left hepatic bile ducts. Attempted to access the central biliary ducts along the path of the stent. While trying to access the central biliary system, the percutaneous access was completely  lost. Unfortunately, catheter would not go through the old puncture site. Therefore, ultrasound was used to identify the left hepatic lobe. A 21 gauge needle was directed into a dilated left biliary duct using ultrasound guidance. 0.018 wire was advanced into the biliary system and transitional dilator set was placed. Contrast injection confirmed placement into the biliary system. Eventually, the 5 French catheter was again advanced into the left biliary ducts. Access was again obtained into the more medial left biliary ducts. At this point, the decision was made to proceed with another external biliary drain. Initially, a 44 [place] biliary drain was placed but the drain was too long and the side holes were outside of the liver. Therefore, a 10 [place] multipurpose drain was selected and additional side holes were placed. Unfortunately, too many side holes were placed and there was drainage outside of the liver. Therefore, another 10 [place] multipurpose drain was used and 2 additional side holes were placed. The drain was placed over a stiff Amplatz wire. Contrast injection confirmed that the drain was not leaking outside of the liver. However, there was some extravasation near the tip of the catheter. Due to this area of extravasation, the catheter was  slightly pulled back. Catheter was attached to a gravity bag. Catheter was sutured to skin. New bandage was placed. FINDINGS: Initial scout image demonstrates that a metallic biliary stent has been placed. Stent extends into the left hepatic ducts. 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 The right-sided biliary drain was still sutured to the skin but the tube was just underneath the skin, within the subcutaneous tissues and not within the liver. The right biliary drain was completely removed. Multiple cholangiogram images in the left hepatic lobe demonstrate an obstructing lesion in the region of the  midportion of stent. Catheter and wire could not be advanced into the central bile ducts or common bile duct. However, catheter was advanced into the more medial left hepatic ducts. After access was obtained into the medial left hepatic ducts, there was some contrast flowing into the biliary stent and common bile duct. Common bile duct distal to the stent appears to be very tortuous. Large amount of retained contrast in the gallbladder from the recent ERCP procedure. External biliary drain was placed in left biliary ducts. Additional side holes were placed in order to adequately decompress the left biliary ducts. Small focus of contrast extravasation near the tip of the external biliary drain. As a result, the biliary drain was slightly pulled back. Ultrasound images during the procedure demonstrate a small amount of right perihepatic ascites. IMPRESSION: 1. Cholangiogram demonstrates persistent biliary obstruction with a recently placed metallic stent in the left hepatic lobe. Narrowing in the mid stent is compatible with the underlying lesion. Unable to cross the lesion or obstruction from the left percutaneous access. 2. Percutaneous access was lost in left hepatic lobe while trying to cross the biliary obstruction. As a result, a new left percutaneous drain was placed. This is a left external biliary drain. 3. Right biliary drain was known to be dislodged and was completely removed. Small amount of perihepatic ascites noted along the right hepatic lobe. 4. Patient was admitted following the procedure due to the concern for infection after recent ERCP procedure and percutaneous procedure. Patient will need a new right percutaneous drain in order to decompress the right hepatic lobe. Electronically Signed   By: Markus Daft M.D.   On: 11/07/2019 17:19   IR BILIARY DRAIN PLACEMENT WITH CHOLANGIOGRAM  Result Date: 11/08/2019 INDICATION: 78 year old female with a history of [place] tumor with prior right-sided and  left-sided biliary drainage catheters. 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 The right-sided drain has been previously accidentally displaced and remains undrained currently. EXAM: IMAGE GUIDED EXCHANGE OF LEFT-SIDED DRAINAGE CATHETER IMAGE GUIDED PLACEMENT OF EXTERNAL RIGHT-SIDED BILIARY DRAIN IMAGE GUIDED PLACEMENT OF BILOMA DRAIN OF THE UPPER ABDOMEN ATTEMPT AT GUN SITE TECHNIQUE CONNECTION OF THE INDWELLING STENT AND THE LEFT-SIDED BILIARY DUCTS WHICH WAS ULTIMATELY UNSUCCESSFUL MEDICATIONS: None ANESTHESIA/SEDATION: Moderate (conscious) sedation was employed during this procedure. A total of Versed 4.0 mg and Fentanyl 200 mcg was administered intravenously. Moderate Sedation Time: 3 hours, 43 minutes. The patient's level of consciousness and vital signs were monitored continuously by radiology nursing throughout the procedure under my direct supervision. FLUOROSCOPY TIME:  Fluoroscopy Time: 46 minutes 54 seconds (966 mGy). COMPLICATIONS: None PROCEDURE: Informed written consent was obtained from the patient after a thorough discussion of the procedural risks, benefits and alternatives. All questions were addressed. Maximal Sterile Barrier Technique was utilized including caps, mask, sterile gowns, sterile gloves, sterile  drape, hand hygiene and skin antiseptic. A timeout was performed prior to the initiation of the procedure. Patient positioned supine position on the fluoroscopy table. Scout images were acquired. The left-sided drain was injected with contrast confirming location and was removed on a Bentson wire. Short Kumpe the catheter was placed confirming contrast in the left-sided biliary ducts. These do not communicate with the stented system. Six French sheath was then placed in the biliary system. Using the angled catheter we attempted to navigate a wire through the tines of the stent. This was unsuccessful. The back end  of both a standard Glidewire and a stiff Glidewire were used to try to penetrate the tines of the stent, unsuccessful. We then proceeded with attempt at gun site technique to connect the undrained biliary system with the drain biliary system. 1% lidocaine was used for local anesthesia. A Chiba needle was used for a percutaneous access to the stented biliary system. Once we confirmed needle tip position, 018 V18 wire was advanced into the stent. A quick cross catheter was successful in crossing the tines of the stent into the biliary system. At this point we recognize that are previously existing left-sided biliary drains had withdrawn with the respiration. Ultrasound-guided access into the left-sided stents was then performed, using modified Seldinger technique. The 6 French sheath was successfully replaced into the left-sided ducts. A micro snare was passed percutaneously into the biliary duct, with an ensnare passed through the 6 French sheath. Ultimately the micro snare was too small for targeting. Up sizing to a 4 [place] [employer] micro puncture was performed. We were then able to pass a 15 mm snare through the 4 French sheath and aligned the 2 snares for the gun site technique. Chiba needle was then passed through the 2 snares using fluoroscopy and oblique imaging. Once we confirmed needle tip position, the 18 wire was passed through the needle and the needle was removed. The 6 French sheath was closed and withdrawn wall simultaneously withdrawing the 15 mm gooseneck snare within the bile stent. Withdrawing the wire was performed without fluoroscopy, and the next fluoro image demonstrated that the snare within the stent system failed to engage the wire. The wire was externalized through the 6 French sheath. Once we recognized that the intraductal snare did not capture the wire, this system was removed from the biliary system. We then simply replaced the left-sided drain with a 10 French pigtail drainage  catheter. We then proceeded with right-sided drainage. Ultrasound survey of the right liver was performed, with then ultrasound of the right liver lobe. Window into the right biliary system was present for the right liver approach. Sizable fluid collection on the liver was also demonstrated. 1% lidocaine was used for local anesthesia, with generous infiltration of the skin and subcutaneous tissues in and inter left costal location. A Chiba needle was advanced under ultrasound guidance into the right liver lobe. Once the tip of the needle was confirmed within the biliary system by injecting small aliquots of contrast, images were stored of the biliary system after partially opacifying the biliary tree via the needle. Once the tip of this needle was confirmed within the biliary system, an 018 wire was advanced centrally. The needle was removed, a small incision was made with an 11 blade scalpel, and then a triaxial Accustick system was advanced into the biliary system. The metal stiffener and dilator were removed, we confirmed placement with contrast infusion. 035 wire was placed. Dilation was performed with 10 [place]  dilator, and then a 10 French pigtail catheter was advanced into the right-sided system. Catheter was formed into the right-sided ducts draining the right-sided ductal system. There was no communication with the stented system. Finally, given the patient's tenderness and peritoneal signs on palpation, we determined that eighth biloma drain was indicated. Ultrasound-guided placement of a 10 French biloma drain into the right abdomen was performed using standard modified Seldinger technique. This 10 French catheter was placed and sutured in position, attached to gravity drainage. The patient tolerated the procedure well and remained hemodynamically stable throughout. No complications were encountered and no significant blood loss was encountered. FINDINGS: Failed gun site technique attempt at connecting  the stented biliary system with the left-sided ductal system. Exchange of left externalized biliary drain for a new 8 [place] drain to gravity. Placement of a new right-sided externalized biliary drain into the right-sided ducks, 10 [place] drain. Placement of biloma drain into the lateral right abdomen. IMPRESSION: Status post failed gun site technique puncture for attempted connection of the undrained left-sided biliary ductal system with the stented central ductal system. Status post exchange of left-sided ductal system with new 8 French drain, placement of new right-sided biliary externalized drainage catheter, as well as biloma drain in the upper abdomen. Signed, Dulcy Fanny. Dellia Nims, RPVI [facility] Electronically Signed   By: Corrie Mckusick D.O.   On: 11/08/2019 16:27   IR BILIARY DRAIN PLACEMENT WITH CHOLANGIOGRAM  Result Date: 11/07/2019 INDICATION: 79 year old with biliary obstruction and undergone multiple procedures for biliary decompression. The patient has had bilateral external biliary drains. Recent CT demonstrated that the right biliary drain was dislodged. Patient underwent ERCP and stent placement on 11/06/2019. Patient presents for repeat cholangiogram through existing catheter and attempt at internalization of a biliary drain. EXAM: 1. CHOLANGIOGRAM THROUGH EXISTING DRAIN 2. PERCUTANEOUS TRANSHEPATIC CHOLANGIOGRAM WITH ULTRASOUND AND FLUOROSCOPIC GUIDANCE AND PLACEMENT OF NEW LEFT BILIARY DRAIN 3. REMOVAL OF THE DISLODGED RIGHT BILIARY DRAIN MEDICATIONS: Zosyn 3.375 g; The antibiotic was administered within an appropriate time frame prior to the initiation of the procedure. ANESTHESIA/SEDATION: Moderate (conscious) sedation was employed during this procedure. A total of Versed 2.0 mg and Fentanyl 100 mcg was administered intravenously. Moderate Sedation Time: 90 minutes. The patient's level of consciousness and vital signs were  monitored continuously by radiology nursing throughout the procedure under my direct supervision. FLUOROSCOPY TIME:  Fluoroscopy Time: 27 minutes, 42 seconds, [id number] mGy COMPLICATIONS: None immediate. PROCEDURE: Informed written consent was obtained from the patient after a thorough discussion of the procedural risks, benefits and alternatives. All questions were addressed. Maximal Sterile Barrier Technique was utilized including caps, mask, sterile gowns, sterile gloves, sterile drape, hand hygiene and skin antiseptic. A timeout was performed prior to the initiation of the procedure. Patient was placed supine on the interventional table. Both drains were prepped and draped in sterile fashion. Scout image of the abdomen was obtained. Left drain was injected with contrast. The skin around the drain was anesthetized with 1% lidocaine. The retention suture was removed. The catheter was cut and removed over a Bentson wire. Five [place] Kumpe catheter was placed. Additional cholangiogram images were obtained. Eventually, a Roadrunner wire was advanced into branches along the right side of the metallic biliary stent. These branches were not opacifying during the initial contrast injection. A Glide catheter was advanced into these branches and demonstrated additional dilated bile ducts. These most likely represent medial left hepatic bile ducts. Attempted to access the central biliary ducts  along the path of the stent. While trying to access the central biliary system, the percutaneous access was completely lost. Unfortunately, catheter would not go through the old puncture site. Therefore, ultrasound was used to identify the left hepatic lobe. A 21 gauge needle was directed into a dilated left biliary duct using ultrasound guidance. 0.018 wire was advanced into the biliary system and transitional dilator set was placed. Contrast injection confirmed placement into the biliary system. Eventually, the 5 French catheter was again  advanced into the left biliary ducts. Access was again obtained into the more medial left biliary ducts. At this point, the decision was made to proceed with another external biliary drain. Initially, a 7 [place] biliary drain was placed but the drain was too long and the side holes were outside of the liver. Therefore, a 10 [place] multipurpose drain was selected and additional side holes were placed. Unfortunately, too many side holes were placed and there was drainage outside of the liver. Therefore, another 10 [place] multipurpose drain was used and 2 additional side holes were placed. The drain was placed over a stiff Amplatz wire. Contrast injection confirmed that the drain was not leaking outside of the liver. However, there was some extravasation near the tip of the catheter. Due to this area of extravasation, the catheter was slightly pulled back. Catheter was attached to a gravity bag. Catheter was sutured to skin. New bandage was placed. FINDINGS: Initial scout image demonstrates that a metallic biliary stent has been placed. Stent extends into the left hepatic ducts. 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 The right-sided biliary drain was still sutured to the skin but the tube was just underneath the skin, within the subcutaneous tissues and not within the liver. The right biliary drain was completely removed. Multiple cholangiogram images in the left hepatic lobe demonstrate an obstructing lesion in the region of the midportion of stent. Catheter and wire could not be advanced into the central bile ducts or common bile duct. However, catheter was advanced into the more medial left hepatic ducts. After access was obtained into the medial left hepatic ducts, there was some contrast flowing into the biliary stent and common bile duct. Common bile duct distal to the stent appears to be very tortuous. Large amount of retained contrast in the  gallbladder from the recent ERCP procedure. External biliary drain was placed in left biliary ducts. Additional side holes were placed in order to adequately decompress the left biliary ducts. Small focus of contrast extravasation near the tip of the external biliary drain. As a result, the biliary drain was slightly pulled back. Ultrasound images during the procedure demonstrate a small amount of right perihepatic ascites. IMPRESSION: 1. Cholangiogram demonstrates persistent biliary obstruction with a recently placed metallic stent in the left hepatic lobe. Narrowing in the mid stent is compatible with the underlying lesion. Unable to cross the lesion or obstruction from the left percutaneous access. 2. Percutaneous access was lost in left hepatic lobe while trying to cross the biliary obstruction. As a result, a new left percutaneous drain was placed. This is a left external biliary drain. 3. Right biliary drain was known to be dislodged and was completely removed. Small amount of perihepatic ascites noted along the right hepatic lobe. 4. Patient was admitted following the procedure due to the concern for infection after recent ERCP procedure and percutaneous procedure. Patient will need a new right percutaneous drain in order to decompress the  right hepatic lobe. Electronically Signed   By: Markus Daft M.D.   On: 11/07/2019 17:19    Labs:  CBC: Recent Labs    11/07/19 0803 11/08/19 0242 11/08/19 1453 11/11/19 0310  WBC 19.0* 11.7* 9.2 7.2  HGB 10.8* 9.6* 10.5* 10.1*  HCT 32.7* 29.5* 32.0* 31.0*  PLT 330 287 324 350    COAGS: Recent Labs    10/18/19 1020 10/24/19 0833 11/07/19 1433  INR 1.0 1.0 1.2  APTT  --   --  38*    BMP: Recent Labs    11/08/19 0242 11/09/19 0602 11/10/19 0409 11/11/19 0310  NA 136 137 138 140  K 4.7 4.1 4.0 3.5  CL 107 109 110 112*  CO2 21* 20* 20* 19*  GLUCOSE 84 91 107* 113*  BUN 32* 22 15 10   CALCIUM 8.5* 8.3* 8.3* 8.2*  CREATININE 1.34* 1.16*  1.06* 0.96  GFRNONAA 38* 45* 50* 57*  GFRAA 44* 52* 58* >60    LIVER FUNCTION TESTS: Recent Labs    11/08/19 0242 11/09/19 0602 11/10/19 0409 11/11/19 0310  BILITOT 11.6* 9.7* 7.6* 7.0*  AST 42* 50* 44* 47*  ALT 7 10 9 7   ALKPHOS 147* 147* 132* 161*  PROT 4.9* 5.0* 5.1* 4.9*  ALBUMIN 1.9* 1.8* 1.8* 1.8*    Assessment and Plan:  79 y/o F with history of [place] tumor causing biliary obstruction well known to IR due to multiple attempts at biliary drainage - most recently she presented to IR for attempt at internalization of her right and left biliary drains on 11/07/19, however this was unsuccessful as the lesion could not be traversed via the left percutaneous access. She was subsequently admitted due to concern for infection post ERCP and percutaneous procedure. She was again seen in IR on 4/16 for attempt at replacement of the right sided biliary drain and a new right external biliary drain as well as a new RUQ biloma drain was placed due to peritonitis. Options moving forward were discussed with patient and her family the following day and they have decided to proceed with further attempted interventions in IR during this hospital stay.  No issues noted with current drains, output is appropriate, all 3 flush easily on my exam today. Patient denies abdominal pain although is significantly more confused on my exam today which was also noted by patient's family when they visited this morning - patient's primary team is aware of this change as well. Tmax 98, WBC 7.2, hgb 10.1, plt 350, ammonia 51, t.bili continues to trend downward (9.7 >> 7.6 >> 7.0), creatinine improved to 0.96.  Will plan to proceed with further IR intervention tomorrow (4/20) with Dr. Anselm Pancoast - patient to be NPO at midnight, AM INR. I have attempted to discuss this procedure with patient's sister in law Glenda today, spoke with [place] husband who will have her call me back.   Continue TID flushes with 5 cc NS, record  output Qshift, dressing changes QD or PRN if soiled, call IR if difficulty flushing or sudden decrease in output.   IR will continue to follow - please call with questions or concerns.   ADDENDUM 1403 - Discussed further IR interventions with patient's sister in law who is agreeable to proceed as planned. Verbal consent obtained and form is in IR control room.  Electronically Signed: Joaquim Nam, PA-C 11/11/2019, 11:32 AM   I spent a total of 25 Minutes at the the patient's bedside AND on the patient's hospital floor or  unit, greater than 50% of which was counseling/coordinating care for right/left external biliary drains and biloma drains follow up.

## 2019-11-11 NOTE — Progress Notes (Signed)
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

## 2019-11-11 NOTE — Progress Notes (Signed)
PROGRESS NOTE    Hailey [name]  [id number] DOB: 11-23-1940 DOA: 11/07/2019 PCP: Prince Solian, MD     Brief Narrative:  Hailey [name] is a 79 y.o. 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    Presents to IR today for evaluation of her external biliary drains as well as ongoing attempts of internalization and possible brush biopsies.  She underwent attempt of internal biliary drain placement 10/18/19, however ultimately left with right-sided external biliary drain placement due to inability to pass the obstruction. She returned 10/24/19 for reattempt to pass the obstruction, however this was unsuccessful and a new left-sided external biliary drain was placed. In the interim she has also undergone several attempts at ERCP to pass the stricture; s/p ERCP 08/27/19, 10/15/19, and repeat yesterday 11/06/19 by GI Dr. Clarene Essex.   Patient underwent cholangiogram through existing drain, attempts repositioning of existing left hepatic drain that required a new puncture due to lost access, placement of new left intrahepatic drain and removal of dislodged right hepatic drain.  Patient's white count elevated however she remains afebrile.  She received IV Zosyn during the procedure.  IR requested to admit the patient due to complicated procedure and elevated white count.  During encounter: Patient lying comfortably on the bed.  Reports that she is doing fine, denies any pain, fever, chills, nausea, vomiting, diarrhea, headache, blurry vision, chest pain, shortness of breath, urinary or bowel changes.  She has chronic leg swelling and she takes Lasix as needed at home.  No history of CHF, orthopnea or PND.  She lives alone.  No history of smoking, alcohol, is a drug use.  Review of Systems: As per HPI otherwise negative.     Subjective: 4/18 x3 (does not know where).  Also believes she is going to go  home to get cloths.  Easily redirectable.  This is distinct change from the last few days.   Assessment & Plan:   Principal Problem:   Biliary obstruction Active Problems:   Parkinson's disease ([place])   Hypertension   AKI (acute kidney injury) ([place])   Hyponatremia   Leukocytosis   Normocytic anemia   Bilateral leg edema  Biliary obstruction/Cholangiocarcinoma obstructive -Discussed case with Dr. Nolen Mu GI, and also read DO. Damita Dunnings note IR.  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 -In order to proceed with for definitive diagnostic/therapeutic treatment patient's drains must be first internalized. -Per Dr. Michail Sermon, Dr. Barron Schmid GI is the subject matter expert when it comes to cholangiocarcinoma and this is his patient.  He will notify Dr. Watt Climes patient is here, expect that Dr. Watt Climes and Dr. Earleen Newport will discuss options for definitive treatment on Monday 4/19. -4/16 s/p biliary drain placement see notes below -Continue IV antibiotics -Continue hydration D5-0.9% saline 61ml/hr -4/17 advance diet to full liquid diet -4/17 care of biliary drain  -3 drains to gravity  -Routine drain care for all 3, with routine flush and record output  -Do not submerge  Disoriented/Encephalopathy -4/19 IR reports patient disoriented today which is new for her -DC pain medication and all other sedating medication. -Ammonia level mildly elevated  Increase Ammonia level -Ammonia mildly elevated given patient's debilitated state may account for her mild confusion. -Lactulose 30 g BID -Trend lactulose; if patient's clears with decline in ammonia will have a reason.  AKI (baseline Cr 0.90) Recent Labs  Lab 11/07/19 0803 11/08/19 0242 11/09/19 0602 11/10/19  [id number] 11/11/19 0310  CREATININE 1.38* 1.34* 1.16* 1.06* 0.96  -Creatinine still not at baseline but improving  -Chronic bilateral lower leg swelling -Patient appears  euvolemic -Strict in and out -Hold home Lasix 20 mg daily  Hyponatremia -Resolved.  Parkinson disease -Sinemet 25-100 mg 1 tab daily  Essential HTN -Toprol XL 100 mg daily -4/19 lisinopril 2.5 mg daily  Normocytic anemia: H&H: 10.8/32.7. -4/16 anemia panel consistent with normocytic anemia -Transfuse for hemoglobin<7     DVT prophylaxis: SCD Code Status: Full Family Communication: 4/18 daughter-in-law at bedside counseled on plan of care answered all questions Disposition Plan:  1.  Where the patient is from 2.  Anticipated d/c place. 3.  Barriers to d/c does patient have cholangiocarcinoma or not?  Does patient need cancer work-up or not?   Consultants:  IR DO. Liana Crocker phone consult Dr. Nolen Mu GI   Procedures/Significant Events:  4/16 biliary drain placement x3;Exchange of left biliary drain for new 8.21F drain.  (white midline to gravity) -New right external biliary drain placement, with 51F drain.  (blue angiodynamics/exodus, to gravity) -New peritoneal drain for biloma, 51F.   (blue BS/flexima, to gravity)  -Attempt at connection of the left biliary system through the existing biliary stent with gunsight technique, without success.   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 This is causing peritonitis/TTP, and we placed a 51F biloma drain for relief.  -The right and left ducts do not communicate.     I have personally reviewed and interpreted all radiology studies and my findings are as above.  VENTILATOR SETTINGS:    Cultures   Antimicrobials: Anti-infectives (From admission, onward)   Start     Dose/Rate Stop   11/07/19 1800  piperacillin-tazobactam (ZOSYN) IVPB 3.375 g     3.375 g 12.5 mL/hr over 240 Minutes     11/07/19 0815  piperacillin-tazobactam (ZOSYN) IVPB 3.375 g     3.375 g 12.5 mL/hr over 240 Minutes 11/07/19 1617      Devices    LINES / TUBES:      Continuous Infusions: . dextrose 5 % and 0.9% NaCl 75 mL/hr  at 11/11/19 0038  . piperacillin-tazobactam (ZOSYN)  IV 3.375 g (11/11/19 0552)     Objective: Vitals:   11/10/19 1506 11/10/19 2048 11/11/19 0431 11/11/19 0438  BP: (!) 141/60 129/62 (!) 155/60   Pulse: (!) 58 (!) 57 67   Resp: 18 16 17    Temp: 97.6 F (36.4 C) 98 F (36.7 C) 98 F (36.7 C)   TempSrc: Oral Oral Oral   SpO2: 100% 98% 99%   Weight:    71.6 kg  Height:        Intake/Output Summary (Last 24 hours) at 11/11/2019 0819 Last data filed at 11/11/2019 0817 Gross per 24 hour  Intake 2100.41 ml  Output 760 ml  Net 1340.41 ml   Filed Weights   11/07/19 0802 11/10/19 0500 11/11/19 0438  Weight: 66.7 kg 68.8 kg 71.6 kg   Physical Exam:  General: A/O x3 (does not know where), other mild confusion, no acute respiratory distress, cachectic Eyes: negative scleral hemorrhage, negative anisocoria, negative icterus ENT: Negative Runny nose, negative gingival bleeding, Neck:  Negative scars, masses, torticollis, lymphadenopathy, JVD Lungs: Clear to auscultation bilaterally without wheezes or crackles Cardiovascular: Regular rate and rhythm without murmur gallop or rub normal S1 and S2 Abdomen: negative abdominal pain, nondistended, positive soft, bowel sounds, no rebound, no ascites, no appreciable mass,3 x biliary drainage present (draining serous fluid) Extremities:  No significant cyanosis, clubbing, or edema bilateral lower extremities Skin:  Positive jaundiced Psychiatric:  Negative depression, negative anxiety, negative fatigue, negative mania  Central nervous system:  Cranial nerves II through XII intact, tongue/uvula midline, all extremities muscle strength 5/5, sensation intact throughout, negative dysarthria, negative expressive aphasia, negative receptive aphasia.  .     Data Reviewed: Care during the described time interval was provided by me .  I have reviewed this patient's available data, including medical history, events of note, physical examination, and all  test results as part of my evaluation.  CBC: Recent Labs  Lab 11/06/19 0248 11/07/19 0803 11/08/19 0242 11/08/19 1453 11/11/19 0310  WBC 10.9* 19.0* 11.7* 9.2 7.2  NEUTROABS  --  17.0* 10.1* 8.1*  --   HGB 12.0 10.8* 9.6* 10.5* 10.1*  HCT 36.3 32.7* 29.5* 32.0* 31.0*  MCV 99.5 98.2 98.0 98.8 97.2  PLT 421* 330 287 324 [id number]   Basic Metabolic Panel: Recent Labs  Lab 11/07/19 0803 11/07/19 1433 11/08/19 0242 11/09/19 0602 11/10/19 0409 11/11/19 0310  NA 134*  --  136 137 138 140  K 5.1  --  4.7 4.1 4.0 3.5  CL 103  --  107 109 110 112*  CO2 19*  --  21* 20* 20* 19*  GLUCOSE 96  --  84 91 107* 113*  BUN 34*  --  32* 22 15 10   CREATININE 1.38*  --  1.34* 1.16* 1.06* 0.96  CALCIUM 8.9  --  8.5* 8.3* 8.3* 8.2*  MG  --  1.8  --  1.9 2.1 2.0  PHOS  --   --   --  2.8 2.5 2.1*   GFR: Estimated Creatinine Clearance: 47.9 mL/min (by C-G formula based on SCr of 0.96 mg/dL). Liver Function Tests: Recent Labs  Lab 11/07/19 0803 11/08/19 0242 11/09/19 0602 11/10/19 0409 11/11/19 0310  AST 44* 42* 50* 44* 47*  ALT 15 7 10 9 7   ALKPHOS 174* 147* 147* 132* 161*  BILITOT 13.7* 11.6* 9.7* 7.6* 7.0*  PROT 5.6* 4.9* 5.0* 5.1* 4.9*  ALBUMIN 2.3* 1.9* 1.8* 1.8* 1.8*   Recent Labs  Lab 11/06/19 0248  LIPASE 30   No results for input(s): AMMONIA in the last 168 hours. Coagulation Profile: Recent Labs  Lab 11/07/19 1433  INR 1.2   Cardiac Enzymes: No results for input(s): CKTOTAL, CKMB, CKMBINDEX, TROPONINI in the last 168 hours. BNP (last 3 results) No results for input(s): PROBNP in the last 8760 hours. HbA1C: No results for input(s): HGBA1C in the last 72 hours. CBG: No results for input(s): GLUCAP in the last 168 hours. Lipid Profile: No results for input(s): CHOL, HDL, LDLCALC, TRIG, CHOLHDL, LDLDIRECT in the last 72 hours. Thyroid Function Tests: No results for input(s): TSH, T4TOTAL, FREET4, T3FREE, THYROIDAB in the last 72 hours. Anemia Panel: No results for  input(s): VITAMINB12, FOLATE, FERRITIN, TIBC, IRON, RETICCTPCT in the last 72 hours. Sepsis Labs: Recent Labs  Lab 11/07/19 1433 11/08/19 1453 11/09/19 0602 11/10/19 0409  PROCALCITON  --  0.80 0.67 0.30  LATICACIDVEN 0.8  --   --   --     Recent Results (from the past 240 hour(s))  SARS CORONAVIRUS 2 (TAT 6-24 HRS) Nasopharyngeal Nasopharyngeal Swab     Status: None   Collection Time: 11/02/19  1:28 PM   Specimen: Nasopharyngeal Swab  Result Value Ref Range Status   SARS Coronavirus 2 NEGATIVE NEGATIVE Final    Comment: (NOTE) SARS-CoV-2 target nucleic acids are NOT DETECTED.  The SARS-CoV-2 RNA is generally detectable in upper and lower respiratory specimens during the acute phase of infection. Negative results do not preclude SARS-CoV-2 infection, do not rule out co-infections with other pathogens, and should not be used as the sole basis for treatment or other patient management decisions. Negative results must be combined with clinical observations, patient history, and epidemiological information. The expected result is Negative. Fact Sheet for Patients: SugarRoll.be Fact Sheet for Healthcare Providers: https://www.Garlon Tuggle-mathews.com/ This test is not yet approved or cleared by the [place] FDA and  has been authorized for detection and/or diagnosis of SARS-CoV-2 by FDA under an Emergency Use Authorization (EUA). This EUA will remain  in effect (meaning this test can be used) for the duration of the COVID-19 declaration under Section 56 4(b)(1) of the Act, 21 U.S.C. section 360bbb-3(b)(1), unless the authorization is terminated or revoked sooner. Performed at [facility], [place] [address], [place], [place] [address]   Culture, blood (routine x 2)     Status: None (Preliminary result)   Collection Time: 11/07/19  2:25 PM   Specimen: BLOOD  Result Value Ref Range Status   Specimen Description BLOOD LEFT ANTECUBITAL   Final   Special Requests   Final    BOTTLES DRAWN AEROBIC AND ANAEROBIC Blood Culture adequate volume   Culture   Final    NO GROWTH 3 DAYS Performed at [facility], [place] [address], [place], [place] [address]    Report Status PENDING  Incomplete  Culture, blood (routine x 2)     Status: None (Preliminary result)   Collection Time: 11/07/19  2:34 PM   Specimen: BLOOD  Result Value Ref Range Status   Specimen Description BLOOD LEFT ANTECUBITAL  Final   Special Requests   Final    BOTTLES DRAWN AEROBIC AND ANAEROBIC Blood Culture adequate volume   Culture   Final    NO GROWTH 3 DAYS Performed at [facility], [place] [address] [place],  [address]    Report Status PENDING  Incomplete         Radiology Studies: No results found.      Scheduled Meds: . aspirin EC  81 mg Oral Daily  . carbidopa-levodopa  1 tablet Oral TID  . metoprolol succinate  100 mg Oral Daily  . sodium chloride flush  5 mL Intracatheter Q8H   Continuous Infusions: . dextrose 5 % and 0.9% NaCl 75 mL/hr at 11/11/19 0038  . piperacillin-tazobactam (ZOSYN)  IV 3.375 g (11/11/19 0552)     LOS: 4 days    Time spent:40 min    Jhene Westmoreland, Geraldo Docker, MD [facility] Pager [phone]  If 7PM-7AM, please contact night-coverage [email or web address] Password [facility] 11/11/2019, 8:19 AM

## 2019-11-12 ENCOUNTER — Inpatient Hospital Stay (HOSPITAL_COMMUNITY): Payer: Medicare Other

## 2019-11-12 ENCOUNTER — Ambulatory Visit: Payer: Medicare Other | Admitting: Family Medicine

## 2019-11-12 HISTORY — PX: IR EXCHANGE BILIARY DRAIN: IMG6046

## 2019-11-12 LAB — COMPREHENSIVE METABOLIC PANEL
ALT: 10 U/L (ref 0–44)
AST: 50 U/L — ABNORMAL HIGH (ref 15–41)
Albumin: 1.9 g/dL — ABNORMAL LOW (ref 3.5–5.0)
Alkaline Phosphatase: 163 U/L — ABNORMAL HIGH (ref 38–126)
Anion gap: 9 (ref 5–15)
BUN: 7 mg/dL — ABNORMAL LOW (ref 8–23)
CO2: 18 mmol/L — ABNORMAL LOW (ref 22–32)
Calcium: 8.2 mg/dL — ABNORMAL LOW (ref 8.9–10.3)
Chloride: 113 mmol/L — ABNORMAL HIGH (ref 98–111)
Creatinine, Ser: 0.91 mg/dL (ref 0.44–1.00)
GFR calc Af Amer: >60 mL/min (ref >60)
GFR calc non Af Amer: >60 mL/min (ref >60)
Glucose, Bld: 100 mg/dL — ABNORMAL HIGH (ref 70–99)
Potassium: 3.6 mmol/L (ref 3.5–5.1)
Sodium: 140 mmol/L (ref 135–145)
Total Bilirubin: 6.2 mg/dL — ABNORMAL HIGH (ref 0.3–1.2)
Total Protein: 5 g/dL — ABNORMAL LOW (ref 6.5–8.1)

## 2019-11-12 LAB — CBC
HCT: 31.7 % — ABNORMAL LOW (ref 36.0–46.0)
Hemoglobin: 10.3 g/dL — ABNORMAL LOW (ref 12.0–15.0)
MCH: 31.6 pg (ref 26.0–34.0)
MCHC: 32.5 g/dL (ref 30.0–36.0)
MCV: 97.2 fL (ref 80.0–100.0)
Platelets: 397 10*3/uL (ref 150–400)
RBC: 3.26 MIL/uL — ABNORMAL LOW (ref 3.87–5.11)
RDW: 15.9 % — ABNORMAL HIGH (ref 11.5–15.5)
WBC: 8.1 10*3/uL (ref 4.0–10.5)
nRBC: 0 % (ref 0.0–0.2)

## 2019-11-12 LAB — CULTURE, BLOOD (ROUTINE X 2)
Culture: NO GROWTH
Culture: NO GROWTH
Special Requests: ADEQUATE
Special Requests: ADEQUATE

## 2019-11-12 LAB — PROTIME-INR
INR: 1.2 (ref 0.8–1.2)
Prothrombin Time: 15.2 seconds (ref 11.4–15.2)

## 2019-11-12 LAB — PHOSPHORUS: Phosphorus: 2.1 mg/dL — ABNORMAL LOW (ref 2.5–4.6)

## 2019-11-12 LAB — MAGNESIUM: Magnesium: 1.9 mg/dL (ref 1.7–2.4)

## 2019-11-12 MED ORDER — FENTANYL CITRATE (PF) 100 MCG/2ML IJ SOLN
INTRAMUSCULAR | Status: AC
  Filled 2019-11-12: qty 2

## 2019-11-12 MED ORDER — FENTANYL CITRATE (PF) 100 MCG/2ML IJ SOLN
INTRAMUSCULAR | Status: AC | PRN
  Administered 2019-11-12 (×2): 25 ug via INTRAVENOUS

## 2019-11-12 MED ORDER — MIDAZOLAM HCL 2 MG/2ML IJ SOLN
INTRAMUSCULAR | Status: AC
  Filled 2019-11-12: qty 2

## 2019-11-12 MED ORDER — MIDAZOLAM HCL 2 MG/2ML IJ SOLN
INTRAMUSCULAR | Status: AC | PRN
  Administered 2019-11-12: 1 mg via INTRAVENOUS

## 2019-11-12 MED ORDER — IOHEXOL 300 MG/ML  SOLN
50.0000 mL | Freq: Once | INTRAMUSCULAR | Status: AC | PRN
  Administered 2019-11-12: 17:00:00 5 mL

## 2019-11-12 MED ORDER — LIDOCAINE HCL 1 % IJ SOLN
INTRAMUSCULAR | Status: AC | PRN
  Administered 2019-11-12: 10 mL

## 2019-11-12 MED ORDER — IOHEXOL 300 MG/ML  SOLN
50.0000 mL | Freq: Once | INTRAMUSCULAR | Status: AC | PRN
  Administered 2019-11-12: 17:00:00 40 mL

## 2019-11-12 MED ORDER — LIDOCAINE HCL 1 % IJ SOLN
INTRAMUSCULAR | Status: AC
  Filled 2019-11-12: qty 20

## 2019-11-12 NOTE — Procedures (Signed)
  Procedure: Exchange R ext bili drain for 1F [place]eller attempts to traverse central obstruction unsuccessful EBL:   minimal Complications:  none immediate  See full dictation in [facility]. Percutaneous interventions may not suffice. Consider surgical and oncologic consultation.  Dillard Cannon MD Main # [phone] Pager  [phone]

## 2019-11-12 NOTE — Progress Notes (Signed)
PROGRESS NOTE    Hailey [name]  [id number] DOB: [birth year] DOA: 11/07/2019 PCP: Prince Solian, MD     Brief Narrative:  Hailey [name] is a 78 y.o. 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    Presents to IR today for evaluation of her external biliary drains as well as ongoing attempts of internalization and possible brush biopsies.  She underwent attempt of internal biliary drain placement 10/18/19, however ultimately left with right-sided external biliary drain placement due to inability to pass the obstruction. She returned 10/24/19 for reattempt to pass the obstruction, however this was unsuccessful and a new left-sided external biliary drain was placed. In the interim she has also undergone several attempts at ERCP to pass the stricture; s/p ERCP 08/27/19, 10/15/19, and repeat yesterday 11/06/19 by GI Dr. Clarene Essex.   Patient underwent cholangiogram through existing drain, attempts repositioning of existing left hepatic drain that required a new puncture due to lost access, placement of new left intrahepatic drain and removal of dislodged right hepatic drain.  Patient's white count elevated however she remains afebrile.  She received IV Zosyn during the procedure.  IR requested to admit the patient due to complicated procedure and elevated white count.  During encounter: Patient lying comfortably on the bed.  Reports that she is doing fine, denies any pain, fever, chills, nausea, vomiting, diarrhea, headache, blurry vision, chest pain, shortness of breath, urinary or bowel changes.  She has chronic leg swelling and she takes Lasix as needed at home.  No history of CHF, orthopnea or PND.  She lives alone.  No history of smoking, alcohol, is a drug use.  Review of Systems: As per HPI otherwise negative.     Subjective: 4/20 attempted to see patient on 2 occasions but off the floor  for surgery.    Assessment & Plan:   Principal Problem:   Biliary obstruction Active Problems:   Parkinson's disease ([place])   Hypertension   AKI (acute kidney injury) ([place])   Hyponatremia   Leukocytosis   Normocytic anemia   Bilateral leg edema   Increased ammonia level  Biliary obstruction/Cholangiocarcinoma obstructive -Discussed case with Dr. Nolen Mu GI, and also read DO. Damita Dunnings note IR.  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 -In order to proceed with for definitive diagnostic/therapeutic treatment patient's drains must be first internalized. -Per Dr. Michail Sermon, Dr. Barron Schmid GI is the subject matter expert when it comes to cholangiocarcinoma and this is his patient.  He will notify Dr. Watt Climes patient is here, expect that Dr. Watt Climes and Dr. Earleen Newport will discuss options for definitive treatment on Monday 4/19. -4/16 s/p biliary drain placement see notes below -Continue IV antibiotics -Continue hydration D5-0.9% saline 29ml/hr -4/17 advance diet to full liquid diet -4/17 care of biliary drain  -3 drains to gravity  -Routine drain care for all 3, with routine flush and record output  -Do not submerge -4/20 again IR attempted to bypass obstruction but was unsuccessful see note below. -We will need to contact Dr. Barron Schmid GI in a.m. suggested by IR that surgical and oncology consult be obtained.  Case will need to be discussed as over the weekend I suggested surgical consult and was informed this was not the preferred method.  Disoriented/Encephalopathy -4/19 IR reports patient disoriented today which is new for her -DC pain medication and all other sedating medication. -Ammonia level mildly elevated; see increased ammonia  level  Increase Ammonia level -Ammonia mildly elevated given patient's debilitated state may account for her mild confusion. -Lactulose 30 g BID -Trend lactulose; if patient's clears with  decline in ammonia will have a reason.  AKI (baseline Cr 0.90) Recent Labs  Lab 11/08/19 0242 11/09/19 0602 11/10/19 0409 11/11/19 0310 11/12/19 0329  CREATININE 1.34* 1.16* 1.06* 0.96 0.91  -Creatinine still not at baseline but improving  -Chronic bilateral lower leg swelling -Patient appears euvolemic -Strict in and out -Hold home Lasix 20 mg daily  Hyponatremia -Resolved.  Parkinson disease -Sinemet 25-100 mg 1 tab daily  Essential HTN -Toprol XL 100 mg daily -4/19 lisinopril 2.5 mg daily  Normocytic anemia: H&H: 10.8/32.7. -4/16 anemia panel consistent with normocytic anemia -Transfuse for hemoglobin<7     DVT prophylaxis: SCD Code Status: Full Family Communication: 4/18 daughter-in-law at bedside counseled on plan of care answered all questions Disposition Plan:  1.  Where the patient is from 2.  Anticipated d/c place. 3.  Barriers to d/c does patient have cholangiocarcinoma or not?  Does patient need cancer work-up or not?   Consultants:  IR DO. Liana Crocker phone consult Dr. Nolen Mu GI   Procedures/Significant Events:  4/16 biliary drain placement x3;Exchange of left biliary drain for new 8.67F drain.  (white midline to gravity) -New right external biliary drain placement, with 25F drain.  (blue angiodynamics/exodus, to gravity) -New peritoneal drain for biloma, 25F.   (blue BS/flexima, to gravity)  -Attempt at connection of the left biliary system through the existing biliary stent with gunsight technique, without success.   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 This is causing peritonitis/TTP, and we placed a 25F biloma drain for relief.  -The right and left ducts do not communicate.  4/20 IR exchange biliary drain; -obstruction of the right biliary tree at the biliary confluence could not be traversed antegrade. -12 Leonides Schanz catheter placed as right external biliary drain.   I have personally reviewed and interpreted  all radiology studies and my findings are as above.  VENTILATOR SETTINGS:    Cultures   Antimicrobials: Anti-infectives (From admission, onward)   Start     Dose/Rate Stop   11/07/19 1800  piperacillin-tazobactam (ZOSYN) IVPB 3.375 g     3.375 g 12.5 mL/hr over 240 Minutes     11/07/19 0815  piperacillin-tazobactam (ZOSYN) IVPB 3.375 g     3.375 g 12.5 mL/hr over 240 Minutes 11/07/19 1617      Devices    LINES / TUBES:      Continuous Infusions: . dextrose 5 % and 0.9% NaCl 75 mL/hr at 11/11/19 0038  . piperacillin-tazobactam (ZOSYN)  IV 3.375 g (11/12/19 [id number])     Objective: Vitals:   11/11/19 1501 11/11/19 2101 11/12/19 0544 11/12/19 1323  BP: (!) 154/76 (!) 164/66 (!) 142/74 124/60  Pulse: 70 72 64 62  Resp: 19 18 20 17   Temp: (!) 97.5 F (36.4 C) 98.1 F (36.7 C) 98.2 F (36.8 C) 98.6 F (37 C)  TempSrc: Oral Oral Oral Axillary  SpO2: 98% 98% 99% 98%  Weight:      Height:        Intake/Output Summary (Last 24 hours) at 11/12/2019 1412 Last data filed at 11/12/2019 1100 Gross per 24 hour  Intake 1382.57 ml  Output 660 ml  Net 722.57 ml   Filed Weights   11/07/19 0802 11/10/19 0500 11/11/19 0438  Weight: 66.7 kg 68.8 kg 71.6 kg   Physical Exam:  Attempted to  see patient on 2 occasions today however patient was in the IR suite. .     Data Reviewed: Care during the described time interval was provided by me .  I have reviewed this patient's available data, including medical history, events of note, physical examination, and all test results as part of my evaluation.  CBC: Recent Labs  Lab 11/07/19 0803 11/08/19 0242 11/08/19 1453 11/11/19 0310 11/12/19 0329  WBC 19.0* 11.7* 9.2 7.2 8.1  NEUTROABS 17.0* 10.1* 8.1*  --   --   HGB 10.8* 9.6* 10.5* 10.1* 10.3*  HCT 32.7* 29.5* 32.0* 31.0* 31.7*  MCV 98.2 98.0 98.8 97.2 97.2  PLT 330 287 324 350 [id number]   Basic Metabolic Panel: Recent Labs  Lab 11/07/19 0803 11/07/19 1433 11/08/19 0242  11/09/19 0602 11/10/19 0409 11/11/19 0310 11/12/19 0329  NA   < >  --  136 137 138 140 140  K   < >  --  4.7 4.1 4.0 3.5 3.6  CL   < >  --  107 109 110 112* 113*  CO2   < >  --  21* 20* 20* 19* 18*  GLUCOSE   < >  --  84 91 107* 113* 100*  BUN   < >  --  32* 22 15 10  7*  CREATININE   < >  --  1.34* 1.16* 1.06* 0.96 0.91  CALCIUM   < >  --  8.5* 8.3* 8.3* 8.2* 8.2*  MG  --  1.8  --  1.9 2.1 2.0 1.9  PHOS  --   --   --  2.8 2.5 2.1* 2.1*   < > = values in this interval not displayed.   GFR: Estimated Creatinine Clearance: 50.5 mL/min (by C-G formula based on SCr of 0.91 mg/dL). Liver Function Tests: Recent Labs  Lab 11/08/19 0242 11/09/19 0602 11/10/19 0409 11/11/19 0310 11/12/19 0329  AST 42* 50* 44* 47* 50*  ALT 7 10 9 7 10   ALKPHOS 147* 147* 132* 161* 163*  BILITOT 11.6* 9.7* 7.6* 7.0* 6.2*  PROT 4.9* 5.0* 5.1* 4.9* 5.0*  ALBUMIN 1.9* 1.8* 1.8* 1.8* 1.9*   Recent Labs  Lab 11/06/19 0248  LIPASE 30   Recent Labs  Lab 11/11/19 1243  AMMONIA 51*   Coagulation Profile: Recent Labs  Lab 11/07/19 1433 11/12/19 0329  INR 1.2 1.2   Cardiac Enzymes: No results for input(s): CKTOTAL, CKMB, CKMBINDEX, TROPONINI in the last 168 hours. BNP (last 3 results) No results for input(s): PROBNP in the last 8760 hours. HbA1C: No results for input(s): HGBA1C in the last 72 hours. CBG: No results for input(s): GLUCAP in the last 168 hours. Lipid Profile: No results for input(s): CHOL, HDL, LDLCALC, TRIG, CHOLHDL, LDLDIRECT in the last 72 hours. Thyroid Function Tests: No results for input(s): TSH, T4TOTAL, FREET4, T3FREE, THYROIDAB in the last 72 hours. Anemia Panel: No results for input(s): VITAMINB12, FOLATE, FERRITIN, TIBC, IRON, RETICCTPCT in the last 72 hours. Sepsis Labs: Recent Labs  Lab 11/07/19 1433 11/08/19 1453 11/09/19 0602 11/10/19 0409  PROCALCITON  --  0.80 0.67 0.30  LATICACIDVEN 0.8  --   --   --     Recent Results (from the past 240 hour(s))    Culture, blood (routine x 2)     Status: None   Collection Time: 11/07/19  2:25 PM   Specimen: BLOOD  Result Value Ref Range Status   Specimen Description BLOOD LEFT ANTECUBITAL  Final   Special Requests  Final    BOTTLES DRAWN AEROBIC AND ANAEROBIC Blood Culture adequate volume   Culture   Final    NO GROWTH 5 DAYS Performed at [facility], [place] [address] [place], [place] [address]    Report Status 11/12/2019 FINAL  Final  Culture, blood (routine x 2)     Status: None   Collection Time: 11/07/19  2:34 PM   Specimen: BLOOD  Result Value Ref Range Status   Specimen Description BLOOD LEFT ANTECUBITAL  Final   Special Requests   Final    BOTTLES DRAWN AEROBIC AND ANAEROBIC Blood Culture adequate volume   Culture   Final    NO GROWTH 5 DAYS Performed at [facility], [place] [address], [place], [place] [address]    Report Status 11/12/2019 FINAL  Final         Radiology Studies: CT ABDOMEN WO CONTRAST  Result Date: 11/11/2019 CLINICAL DATA:  79 year old with biliary obstruction and status post percutaneous drains. In addition, the patient has a perihepatic drainage catheter. EXAM: CT ABDOMEN WITHOUT CONTRAST TECHNIQUE: Multidetector CT imaging of the abdomen was performed following the standard protocol without IV contrast. COMPARISON:  CT 11/06/2019 FINDINGS: Lower chest: Patient has developed a small right pleural effusion. Small amount of compressive atelectasis at the right lung base. Hepatobiliary: Currently, there is a left external biliary drain and right external biliary drain. There is a metallic stent in the region of the common hepatic duct that extends into the left biliary system. There is pneumobilia associated with this metallic biliary stent and the associated left biliary duct. Lack of IV contrast limits evaluation of the biliary system but there continues to be evidence for intrahepatic biliary dilatation. Narrowing of the metallic biliary stent near the  central aspect of the liver compatible with an underlying liver or biliary lesion. There is a percutaneous drain adjacent to the lateral aspect of the right hepatic lobe. Small residual fluid collection along the inferior aspect of the drain and inferior aspect of the right hepatic lobe. Craniocaudal dimension of the collection is not completely visualized but the collection measures greater than 4.8 cm in the craniocaudal dimension. Collection measures 3.9 x 2.2 cm on the axial images, sequence 3, image 43. Pancreas: Stable appearance of the pancreas without significant duct dilatation. Again noted is a round low-density structure just anterior to the pancreatic body and neck region on sequence 3, image 24. This is similar to previous examinations and measures 2.3 x 3.2 cm. Spleen: Normal in size without focal abnormality. Adrenals/Urinary Tract: Normal adrenal glands. Normal appearance of both kidneys without hydronephrosis. Stomach/Bowel: Normal appearance of stomach. Visualized small and large bowel are unremarkable. No evidence for bowel obstruction. Vascular/Lymphatic: Atherosclerotic calcifications in the abdominal aorta without aneurysm. Other: Small pocket of intraperitoneal air anterior to the left hepatic lobe on sequence 3, image 21. This area is near the left hepatic drain and likely associated with the drain. Musculoskeletal: Facet arthropathy in the lower lumbar spine. IMPRESSION: 1. Bilateral biliary drains are in place. These are external biliary drains that do not extend into the extrahepatic biliary system or duodenum. Evidence for residual intrahepatic biliary dilatation. 2. Metallic biliary stent is located in the common hepatic duct and extends into lateral left hepatic lobe. There is pneumobilia associated with this biliary stent. Marked narrowing in the midportion of stent compatible with underlying biliary or liver lesion. 3. Residual fluid collection in the right inferior perihepatic space.  This fluid collection is just caudal  to the percutaneous drain and may not be communicating with the drain. 4. Stable rounded lesion anterior to the pancreas. This has been described on previous examinations and may represent a pseudo cyst but indeterminate. 5. New small right pleural effusion. Electronically Signed   By: Markus Daft M.D.   On: 11/11/2019 08:22        Scheduled Meds: . aspirin EC  81 mg Oral Daily  . carbidopa-levodopa  1 tablet Oral TID  . lactulose  30 g Oral BID  . metoprolol succinate  100 mg Oral Daily  . sodium chloride flush  5 mL Intracatheter Q8H   Continuous Infusions: . dextrose 5 % and 0.9% NaCl 75 mL/hr at 11/11/19 0038  . piperacillin-tazobactam (ZOSYN)  IV 3.375 g (11/12/19 0608)     LOS: 5 days    Time spent:40 min    Loida Calamia, Geraldo Docker, MD [facility] Pager [phone]  If 7PM-7AM, please contact night-coverage [email or web address] Password [email or web address] 11/12/2019, 2:12 PM

## 2019-11-13 ENCOUNTER — Encounter (HOSPITAL_COMMUNITY): Payer: Self-pay

## 2019-11-13 DIAGNOSIS — K831 Obstruction of bile duct: Secondary | ICD-10-CM | POA: Diagnosis not present

## 2019-11-13 DIAGNOSIS — I10 Essential (primary) hypertension: Secondary | ICD-10-CM | POA: Diagnosis not present

## 2019-11-13 DIAGNOSIS — D72829 Elevated white blood cell count, unspecified: Secondary | ICD-10-CM

## 2019-11-13 DIAGNOSIS — K869 Disease of pancreas, unspecified: Secondary | ICD-10-CM

## 2019-11-13 DIAGNOSIS — K668 Other specified disorders of peritoneum: Secondary | ICD-10-CM | POA: Diagnosis not present

## 2019-11-13 DIAGNOSIS — N179 Acute kidney failure, unspecified: Secondary | ICD-10-CM | POA: Diagnosis not present

## 2019-11-13 DIAGNOSIS — R7989 Other specified abnormal findings of blood chemistry: Secondary | ICD-10-CM

## 2019-11-13 LAB — CBC
HCT: 31.4 % — ABNORMAL LOW (ref 36.0–46.0)
Hemoglobin: 10 g/dL — ABNORMAL LOW (ref 12.0–15.0)
MCH: 31.6 pg (ref 26.0–34.0)
MCHC: 31.8 g/dL (ref 30.0–36.0)
MCV: 99.4 fL (ref 80.0–100.0)
Platelets: 297 10*3/uL (ref 150–400)
RBC: 3.16 MIL/uL — ABNORMAL LOW (ref 3.87–5.11)
RDW: 16.1 % — ABNORMAL HIGH (ref 11.5–15.5)
WBC: 6.2 10*3/uL (ref 4.0–10.5)
nRBC: 0 % (ref 0.0–0.2)

## 2019-11-13 LAB — COMPREHENSIVE METABOLIC PANEL
ALT: 16 U/L (ref 0–44)
AST: 53 U/L — ABNORMAL HIGH (ref 15–41)
Albumin: 1.8 g/dL — ABNORMAL LOW (ref 3.5–5.0)
Alkaline Phosphatase: 165 U/L — ABNORMAL HIGH (ref 38–126)
Anion gap: 5 (ref 5–15)
BUN: 8 mg/dL (ref 8–23)
CO2: 22 mmol/L (ref 22–32)
Calcium: 8.2 mg/dL — ABNORMAL LOW (ref 8.9–10.3)
Chloride: 116 mmol/L — ABNORMAL HIGH (ref 98–111)
Creatinine, Ser: 0.9 mg/dL (ref 0.44–1.00)
GFR calc Af Amer: >60 mL/min (ref >60)
GFR calc non Af Amer: >60 mL/min (ref >60)
Glucose, Bld: 86 mg/dL (ref 70–99)
Potassium: 3.8 mmol/L (ref 3.5–5.1)
Sodium: 143 mmol/L (ref 135–145)
Total Bilirubin: 5.1 mg/dL — ABNORMAL HIGH (ref 0.3–1.2)
Total Protein: 4.6 g/dL — ABNORMAL LOW (ref 6.5–8.1)

## 2019-11-13 LAB — MAGNESIUM: Magnesium: 1.9 mg/dL (ref 1.7–2.4)

## 2019-11-13 LAB — PHOSPHORUS: Phosphorus: 2.6 mg/dL (ref 2.5–4.6)

## 2019-11-13 LAB — AMMONIA: Ammonia: 51 umol/L — ABNORMAL HIGH (ref 9–35)

## 2019-11-13 MED ORDER — HYDROCODONE-ACETAMINOPHEN 5-325 MG PO TABS
1.0000 | ORAL_TABLET | ORAL | Status: DC | PRN
  Administered 2019-11-14: 1 via ORAL
  Filled 2019-11-13: qty 1

## 2019-11-13 MED ORDER — SODIUM CHLORIDE 0.9% FLUSH
5.0000 mL | Freq: Three times a day (TID) | INTRAVENOUS | Status: DC
  Administered 2019-11-13 – 2019-11-18 (×15): 5 mL

## 2019-11-13 NOTE — Progress Notes (Signed)
PROGRESS NOTE    Hailey [name]  [id number] DOB: 1941/06/26 DOA: 11/07/2019 PCP: Prince Solian, MD     Brief Narrative:  Hailey [name] is a 79 y.o. 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    Presents to IR today for evaluation of her external biliary drains as well as ongoing attempts of internalization and possible brush biopsies.  She underwent attempt of internal biliary drain placement 10/18/19, however ultimately left with right-sided external biliary drain placement due to inability to pass the obstruction. She returned 10/24/19 for reattempt to pass the obstruction, however this was unsuccessful and a new left-sided external biliary drain was placed. In the interim she has also undergone several attempts at ERCP to pass the stricture; s/p ERCP 08/27/19, 10/15/19, and repeat yesterday 11/06/19 by GI Dr. Clarene Essex.   Patient underwent cholangiogram through existing drain, attempts repositioning of existing left hepatic drain that required a new puncture due to lost access, placement of new left intrahepatic drain and removal of dislodged right hepatic drain.  Patient's white count elevated however she remains afebrile.  She received IV Zosyn during the procedure.  IR requested to admit the patient due to complicated procedure and elevated white count.  During encounter: Patient lying comfortably on the bed.  Reports that she is doing fine, denies any pain, fever, chills, nausea, vomiting, diarrhea, headache, blurry vision, chest pain, shortness of breath, urinary or bowel changes.  She has chronic leg swelling and she takes Lasix as needed at home.  No history of CHF, orthopnea or PND.  She lives alone.  No history of smoking, alcohol, is a drug use.  Review of Systems: As per HPI otherwise negative.    Subjective: Afebrile, no chest pain, no nausea, no vomiting.  Still mild  intermittent discomfort in her right upper quadrant.  Icterus appreciated on exam.    Assessment & Plan:   Principal Problem:   Biliary obstruction Active Problems:   Parkinson's disease ([place])   Hypertension   AKI (acute kidney injury) ([place])   Hyponatremia   Leukocytosis   Normocytic anemia   Bilateral leg edema   Increased ammonia level  Biliary obstruction/Cholangiocarcinoma obstructive -Discussed case with Dr. Nolen Mu GI, and also read DO. Damita Dunnings note IR.  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 -In order to proceed with for definitive diagnostic/therapeutic treatment patient's drains must be first internalized. -Per Dr. Michail Sermon, Dr. Barron Schmid GI is the subject matter expert when it comes to cholangiocarcinoma and this is his patient.  He will notify Dr. Watt Climes patient is here, expect that Dr. Watt Climes and Dr. Earleen Newport will discuss options for definitive treatment on Monday 4/19. -4/16 s/p biliary drain placement see notes below -Continue IV antibiotics -Continue hydration D5-0.9% saline 74ml/hr -4/17 advance diet to full liquid diet -4/17 care of biliary drain  -3 drains to gravity  -Routine drain care for all 3, with routine flush and record output  -Do not submerge -4/20 again IR attempted to bypass obstruction but was unsuccessful see note below. -Per discussion with Dr. Vernice Jefferson patient will require referral to tertiary hospital after discharge to light and attempt bypass.  At this moment oncology will not be able to assess with work-up given lack of tissue sampling.  Patient has expressed a gastroenterologist that she is doing well preserved surgical intervention and would prefer to has bypass instead.  Disoriented/Encephalopathy -on 4/19 IR reports patient  disoriented today which was new for her -After discontinuing pain medication and all other sedating medication; plus initiation of lactulose -Continue  constant reorientation.  Increased Ammonia level -Ammonia mildly elevated  -Continue to follow ammonia level intermittently -Currently demonstrating to be oriented x3 and no having any foggy thoughts. -Continue lactulose 30 g twice daily  AKI (baseline Cr 0.90) Recent Labs  Lab 11/09/19 0602 11/10/19 0409 11/11/19 0310 11/12/19 0329 11/13/19 0305  CREATININE 1.16* 1.06* 0.96 0.91 0.90  -Creatinine is now back to baseline after fluid resuscitation and holding nephrotoxic agents. -Maintain adequate hydration and is slowly resume prior to admission medication. -Continue to follow renal function trend intermittently.  -Chronic bilateral lower leg swelling -Patient appears euvolemic -Continue to monitor strict intake and output. -Continue holding Lasix for now.    Hyponatremia -Resolved after fluid resuscitation..  Parkinson disease -Sinemet 25-100 mg 1 tab daily  Essential HTN -Stable and well-controlled -Continue Toprol and lisinopril.  Normocytic anemia:  H&H: 10.8/32.7. -4/16 anemia panel consistent with normocytic anemia -Transfuse for hemoglobin<7 -No signs of overt bleeding.     DVT prophylaxis: SCD Code Status: Full Family Communication: 4/18 daughter-in-law at bedside counseled on plan of care answered all questions Disposition Plan:  1.  Where the patient is from: Home 2.  Anticipated d/c place : Home 3.  Barriers to d/c will attempt advancing diet and if able to tolerate it will be able to go home with oral antibiotics on 11/14/2019 and follow-up at a tertiary hospital for further evaluation and management.  (Case discussed with Dr. Watt Climes over the phone on 11/13/2019).   Consultants:  IR Dr. Liana Crocker phone consult Dr. Nolen Mu GI   Procedures/Significant Events:  4/16 biliary drain placement x3;Exchange of left biliary drain for new 8.37F drain.  (white midline to gravity) -New right external biliary drain placement, with 21F drain.   (blue angiodynamics/exodus, to gravity) -New peritoneal drain for biloma, 21F.   (blue BS/flexima, to gravity)  -Attempt at connection of the left biliary system through the existing biliary stent with gunsight technique, without success.   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 This is causing peritonitis/TTP, and we placed a 21F biloma drain for relief.  -The right and left ducts do not communicate.  4/20 IR exchange biliary drain; -obstruction of the right biliary tree at the biliary confluence could not be traversed antegrade. -12 Leonides Schanz catheter placed as right external biliary drain.   I have personally reviewed and interpreted all radiology studies and my findings are as above.  VENTILATOR SETTINGS:    Cultures   Antimicrobials: Anti-infectives (From admission, onward)   Start     Dose/Rate Stop   11/07/19 1800  piperacillin-tazobactam (ZOSYN) IVPB 3.375 g     3.375 g 12.5 mL/hr over 240 Minutes     11/07/19 0815  piperacillin-tazobactam (ZOSYN) IVPB 3.375 g     3.375 g 12.5 mL/hr over 240 Minutes 11/07/19 1617      Devices    LINES / TUBES:   Continuous Infusions: . dextrose 5 % and 0.9% NaCl 75 mL/hr at 11/13/19 0942  . piperacillin-tazobactam (ZOSYN)  IV 3.375 g (11/13/19 1401)     Objective: Vitals:   11/12/19 1713 11/12/19 2005 11/13/19 0422 11/13/19 1513  BP: (!) 141/60 138/65 (!) 153/63 (!) 157/62  Pulse: 64 (!) 55 (!) 52 (!) 52  Resp: 19 18 18 17   Temp: 97.6 F (36.4 C) 97.9 F (36.6 C) 97.7 F (36.5 C) 98 F (36.7  C)  TempSrc: Axillary Oral Oral Oral  SpO2: 99% 99% 99% 98%  Weight:      Height:        Intake/Output Summary (Last 24 hours) at 11/13/2019 1713 Last data filed at 11/13/2019 1145 Gross per 24 hour  Intake 0 ml  Output 350 ml  Net -350 ml   Filed Weights   11/07/19 0802 11/10/19 0500 11/11/19 0438  Weight: 66.7 kg 68.8 kg 71.6 kg   Physical Exam: General exam: Alert, awake, oriented x 3, positive icterus  appreciated on exam; mild jaundice.  No fever, no nausea, no vomiting.  Reports mild right upper quadrant discomfort. Respiratory system: Clear to auscultation. Respiratory effort normal. Cardiovascular system: RRR. No rubs or gallops; no JVD. Gastrointestinal system: Abdomen is nondistended, soft and mildly tender to palpation in the right upper quadrant; external percutaneous biliary drain in place. Normal bowel sounds heard. Central nervous system: Alert and oriented. No focal neurological deficits. Extremities: No cyanosis or clubbing. Skin: No rashes, no petechiae. Psychiatry: Judgement and insight appear normal. Mood & affect appropriate.   Data Reviewed: Care during the described time interval was provided by me .  I have reviewed this patient's available data, including medical history, events of note, physical examination, and all test results as part of my evaluation.  CBC: Recent Labs  Lab 11/07/19 0803 11/07/19 0803 11/08/19 0242 11/08/19 1453 11/11/19 0310 11/12/19 0329 11/13/19 0305  WBC 19.0*   < > 11.7* 9.2 7.2 8.1 6.2  NEUTROABS 17.0*  --  10.1* 8.1*  --   --   --   HGB 10.8*   < > 9.6* 10.5* 10.1* 10.3* 10.0*  HCT 32.7*   < > 29.5* 32.0* 31.0* 31.7* 31.4*  MCV 98.2   < > 98.0 98.8 97.2 97.2 99.4  PLT 330   < > 287 324 350 397 297   < > = values in this interval not displayed.   Basic Metabolic Panel: Recent Labs  Lab 11/09/19 0602 11/10/19 0409 11/11/19 0310 11/12/19 0329 11/13/19 0305  NA 137 138 140 140 143  K 4.1 4.0 3.5 3.6 3.8  CL 109 110 112* 113* 116*  CO2 20* 20* 19* 18* 22  GLUCOSE 91 107* 113* 100* 86  BUN 22 15 10  7* 8  CREATININE 1.16* 1.06* 0.96 0.91 0.90  CALCIUM 8.3* 8.3* 8.2* 8.2* 8.2*  MG 1.9 2.1 2.0 1.9 1.9  PHOS 2.8 2.5 2.1* 2.1* 2.6   GFR: Estimated Creatinine Clearance: 51.1 mL/min (by C-G formula based on SCr of 0.9 mg/dL).   Liver Function Tests: Recent Labs  Lab 11/09/19 0602 11/10/19 0409 11/11/19 0310 11/12/19 0329  11/13/19 0305  AST 50* 44* 47* 50* 53*  ALT 10 9 7 10 16   ALKPHOS 147* 132* 161* 163* 165*  BILITOT 9.7* 7.6* 7.0* 6.2* 5.1*  PROT 5.0* 5.1* 4.9* 5.0* 4.6*  ALBUMIN 1.8* 1.8* 1.8* 1.9* 1.8*    Recent Labs  Lab 11/11/19 1243 11/13/19 0305  AMMONIA 51* 51*   Coagulation Profile: Recent Labs  Lab 11/07/19 1433 11/12/19 0329  INR 1.2 1.2    Sepsis Labs: Recent Labs  Lab 11/07/19 1433 11/08/19 1453 11/09/19 0602 11/10/19 0409  PROCALCITON  --  0.80 0.67 0.30  LATICACIDVEN 0.8  --   --   --     Recent Results (from the past 240 hour(s))  Culture, blood (routine x 2)     Status: None   Collection Time: 11/07/19  2:25 PM  Specimen: BLOOD  Result Value Ref Range Status   Specimen Description BLOOD LEFT ANTECUBITAL  Final   Special Requests   Final    BOTTLES DRAWN AEROBIC AND ANAEROBIC Blood Culture adequate volume   Culture   Final    NO GROWTH 5 DAYS Performed at [facility], [address], [place], [place] [address]    Report Status 11/12/2019 FINAL  Final  Culture, blood (routine x 2)     Status: None   Collection Time: 11/07/19  2:34 PM   Specimen: BLOOD  Result Value Ref Range Status   Specimen Description BLOOD LEFT ANTECUBITAL  Final   Special Requests   Final    BOTTLES DRAWN AEROBIC AND ANAEROBIC Blood Culture adequate volume   Culture   Final    NO GROWTH 5 DAYS Performed at [facility], [place] [address] [place], [place] [address]    Report Status 11/12/2019 FINAL  Final     Radiology Studies: IR EXCHANGE BILIARY DRAIN  Result Date: 11/12/2019 INDICATION: 79 year old female with a history of presumed [place] tumor with prior right-sided and left-sided biliary drainage catheters. 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 New right-sided external biliary drain catheter placed 11/08/2019. Biloma drain catheter placed lateral to the liver. EXAM: EXCHANGE OF  EXTERNAL BILIARY DRAIN CATHETER UNDER FLUOROSCOPY MEDICATIONS: Patient was receiving adequate antibiotic coverage as an inpatient ANESTHESIA/SEDATION: Intravenous Fentanyl 77mcg and Versed 1mg  were administered as conscious sedation during continuous monitoring of the patient's level of consciousness and physiological / cardiorespiratory status by the radiology RN, with a total moderate sedation time of 46 minutes. PROCEDURE: Informed written consent was obtained from the patient and family after a thorough discussion of the procedural risks, benefits and alternatives. All questions were addressed. Maximal Sterile Barrier Technique was utilized including caps, mask, sterile gowns, sterile gloves, sterile drape, hand hygiene and skin antiseptic. A timeout was performed prior to the initiation of the procedure. The skin entry site of the right external biliary drain was infiltrated with 1% lidocaine. Retention suture was cut. Cholangiogram showed good position in the right biliary system, which was decompressed. No extravasation. The catheter was cut and exchanged over a short Amplatz wire for a 9 French short vascular sheath, through which a 5 [place] Kumpe catheter was advanced to the biliary confluence. Multiple attempts were made to traverse the obstruction using the cholangiogram and the endoscopic metallic stent as guidance in multiple projections with a variety of catheter guidewire techniques. Several false passages were created, but access to the CBD could not ultimately be achieved. Ultimately, the Kumpe catheter and sheath were exchanged back for a 12 French Dawson Mueller catheter, formed centrally within the right biliary tree access external biliary drain. The patient tolerated the procedure well. FLUOROSCOPY TIME:  21.4 minutes; [id number] [id number] DAP COMPLICATIONS: None immediate. IMPRESSION: 1. The obstruction of the right biliary tree at the biliary confluence could not be traversed antegrade. 2. 12 Leonides Schanz catheter placed as right external biliary drain. Electronically Signed   By: Lucrezia Europe M.D.   On: 11/12/2019 17:39    Scheduled Meds: . aspirin EC  81 mg Oral Daily  . carbidopa-levodopa  1 tablet Oral TID  . lactulose  30 g Oral BID  . metoprolol succinate  100 mg Oral Daily  . sodium chloride flush  5 mL Intracatheter Q8H  . sodium chloride flush  5 mL Intracatheter Q8H  Continuous Infusions: . dextrose 5 % and 0.9% NaCl 75 mL/hr at 11/13/19 0942  . piperacillin-tazobactam (ZOSYN)  IV 3.375 g (11/13/19 1401)     LOS: 6 days    Time spent: 56 min    Barton Dubois, MD [facility] Pager [phone]  11/13/2019, 5:13 PM

## 2019-11-13 NOTE — Progress Notes (Signed)
Pharmacy Antibiotic Note  Hailey [name] is a 79 y.o. female admitted on 11/07/2019 with complex intra-abdominal infection.  Pharmacy has been consulted for piperacillin/tazobactam dosing.  Patient with biliary obstruction s/p 4/15 ERCP  Ongoing attempts of internalization of biliary drains On 4/20 IR was unsuccessful to bypass obstruction Considering surgical and oncologic consultation  Today, patient is afebrile, WBC WNL, creatinine stable.  Plan: Continue zosyn 3.375g IV every 8 hr Monitor cultures, clinical status, renal function Narrow abx if able and f/u duration   Height: 5\' 5"  (165.1 cm) Weight: 71.6 kg (157 lb 13.6 oz) IBW/kg (Calculated) : 57  Temp (24hrs), Avg:97.7 F (36.5 C), Min:97.6 F (36.4 C), Max:97.9 F (36.6 C)  Recent Labs  Lab 11/07/19 0803 11/07/19 1433 11/08/19 0242 11/08/19 0242 11/08/19 1453 11/09/19 0602 11/10/19 0409 11/11/19 0310 11/12/19 0329 11/13/19 0305  WBC   < >  --  11.7*  --  9.2  --   --  7.2 8.1 6.2  CREATININE   < >  --  1.34*   < >  --  1.16* 1.06* 0.96 0.91 0.90  LATICACIDVEN  --  0.8  --   --   --   --   --   --   --   --    < > = values in this interval not displayed.    Estimated Creatinine Clearance: 51.1 mL/min (by C-G formula based on SCr of 0.9 mg/dL).     Antimicrobials this admission: Pip/tazo 4/16 >>  Cipro 4/14   Microbiology results: 4/15 BCx: ngtd  Thank you for the interesting consult and for involving pharmacy in this patient's care.  Shela Commons, PharmD, BCPS Please check [email or web address] for unit-specific pharmacist phone numbers

## 2019-11-13 NOTE — Progress Notes (Signed)
Referring Physician(s): Clarene Essex  Supervising Physician: Arne Cleveland  Patient Status:  [facility] - In-pt  Chief Complaint: None  Subjective:  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 Vernard Gambles; s/p left external biliary drain placement and right external biliary drain exchange in IR 10/24/2019 by Dr. Annamaria Boots; right external biliary drain was inadvertently removed s/p right external biliary drain placement, left external biliary drain exchange, and right lateral abdomen biloma drain placement in IR 11/09/2019 by Dr. Earleen Newport; s/p attempted/unsuccessful transverse of central obstruction in IR 11/12/2019 by Dr. Vernard Gambles. Patient awake and alert laying in bed. Accompanied by brother and sister-in-law at bedside. Still with scleral icterus but appears less jaundiced that last visit. Left biliary drain, right biliary drain, and right lateral biloma drain sites c/d/i.   Allergies: Merthiolate [thimerosal] and Forteo [parathyroid hormone (recomb)]  Medications: Prior to Admission medications   Medication Sig Start Date End Date Taking? Authorizing Provider  amoxicillin (AMOXIL) 500 MG capsule Take 2,000 mg by mouth See admin instructions. Take 4 capsules (2000 mg) by mouth one hour prior to dental appointment 07/31/15  Yes [provider]  aspirin EC 81 MG tablet Take 81 mg by mouth daily.   Yes [provider]  Calcium Carbonate-Vitamin D (CALTRATE 600+D) 600-400 MG-UNIT per tablet Take 1 tablet by mouth daily.   Yes [provider]  carbidopa-levodopa (SINEMET IR) 25-100 MG tablet TAKE 1 TABLET BY MOUTH THREE TIMES DAILY, TAKE 4 HOURS APART Patient taking differently: Take 1 tablet by mouth 3 (three) times daily. 0800, 1200, 1600 05/14/19  Yes Lomax, Amy, NP  furosemide (LASIX) 20 MG tablet Take 20 mg by mouth daily.    Yes [provider]  guaiFENesin (MUCINEX) 600 MG 12 hr tablet Take  600 mg by mouth 2 (two) times daily as needed (congestion).   Yes [provider]  metoprolol succinate (TOPROL-XL) 100 MG 24 hr tablet Take 100 mg by mouth daily. Take with or immediately following a meal.   Yes [provider]  oxycodone (OXY-IR) 5 MG capsule Take 1 capsule (5 mg total) by mouth every 8 (eight) hours as needed. Patient taking differently: Take 5 mg by mouth every 8 (eight) hours as needed for pain.  11/06/19  Yes Virgel Manifold, MD  Polyethyl Glycol-Propyl Glycol (SYSTANE OP) Place 1 drop into both eyes daily as needed (dry eyes).   Yes [provider]  vitamin C (ASCORBIC ACID) 250 MG tablet Take 500 mg by mouth daily.   Yes [provider]  Vitamin D, Ergocalciferol, (DRISDOL) 50000 units CAPS capsule Take 50,000 Units by mouth 2 (two) times a week. Tuesdays and Fridays   Yes [provider]     Vital Signs: BP (!) 157/62 (BP Location: Left Arm)   Pulse (!) 52   Temp 98 F (36.7 C) (Oral)   Resp 17   Ht 5\' 5"  (1.651 m)   Wt 157 lb 13.6 oz (71.6 kg)   SpO2 98%   BMI 26.27 kg/m   Physical Exam Vitals and nursing note reviewed.  Constitutional:      General: She is not in acute distress. Eyes:     General: Scleral icterus present.  Pulmonary:     Effort: Pulmonary effort is normal. No respiratory distress.  Abdominal:     Comments: Left/right biliary drains and right biloma drain sites without tenderness, erythema, drainage, or active bleeding; Left external biliary drain with minimal  output of dark bile in gravity bag- this was flushed with return of fluid into gravity bag; Right external biliary drain with approximately 200 cc dark bile in gravity bag; Right lateral abdomen biloma drain with approximately 50 cc dark bile in gravity bag.    Skin:    General: Skin is warm and dry.     Coloration: Skin is jaundiced.  Neurological:     Mental Status: She is alert.     Imaging: CT ABDOMEN WO CONTRAST  Result Date:  11/11/2019 CLINICAL DATA:  79 year old with biliary obstruction and status post percutaneous drains. In addition, the patient has a perihepatic drainage catheter. EXAM: CT ABDOMEN WITHOUT CONTRAST TECHNIQUE: Multidetector CT imaging of the abdomen was performed following the standard protocol without IV contrast. COMPARISON:  CT 11/06/2019 FINDINGS: Lower chest: Patient has developed a small right pleural effusion. Small amount of compressive atelectasis at the right lung base. Hepatobiliary: Currently, there is a left external biliary drain and right external biliary drain. There is a metallic stent in the region of the common hepatic duct that extends into the left biliary system. There is pneumobilia associated with this metallic biliary stent and the associated left biliary duct. Lack of IV contrast limits evaluation of the biliary system but there continues to be evidence for intrahepatic biliary dilatation. Narrowing of the metallic biliary stent near the central aspect of the liver compatible with an underlying liver or biliary lesion. There is a percutaneous drain adjacent to the lateral aspect of the right hepatic lobe. Small residual fluid collection along the inferior aspect of the drain and inferior aspect of the right hepatic lobe. Craniocaudal dimension of the collection is not completely visualized but the collection measures greater than 4.8 cm in the craniocaudal dimension. Collection measures 3.9 x 2.2 cm on the axial images, sequence 3, image 43. Pancreas: Stable appearance of the pancreas without significant duct dilatation. Again noted is a round low-density structure just anterior to the pancreatic body and neck region on sequence 3, image 24. This is similar to previous examinations and measures 2.3 x 3.2 cm. Spleen: Normal in size without focal abnormality. Adrenals/Urinary Tract: Normal adrenal glands. Normal appearance of both kidneys without hydronephrosis. Stomach/Bowel: Normal appearance  of stomach. Visualized small and large bowel are unremarkable. No evidence for bowel obstruction. Vascular/Lymphatic: Atherosclerotic calcifications in the abdominal aorta without aneurysm. Other: Small pocket of intraperitoneal air anterior to the left hepatic lobe on sequence 3, image 21. This area is near the left hepatic drain and likely associated with the drain. Musculoskeletal: Facet arthropathy in the lower lumbar spine. IMPRESSION: 1. Bilateral biliary drains are in place. These are external biliary drains that do not extend into the extrahepatic biliary system or duodenum. Evidence for residual intrahepatic biliary dilatation. 2. Metallic biliary stent is located in the common hepatic duct and extends into lateral left hepatic lobe. There is pneumobilia associated with this biliary stent. Marked narrowing in the midportion of stent compatible with underlying biliary or liver lesion. 3. Residual fluid collection in the right inferior perihepatic space. This fluid collection is just caudal to the percutaneous drain and may not be communicating with the drain. 4. Stable rounded lesion anterior to the pancreas. This has been described on previous examinations and may represent a pseudo cyst but indeterminate. 5. New small right pleural effusion. Electronically Signed   By: Markus Daft M.D.   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 New right-sided external biliary drain catheter placed 11/08/2019. Biloma drain catheter placed lateral to the liver. EXAM: EXCHANGE OF EXTERNAL BILIARY DRAIN CATHETER UNDER FLUOROSCOPY MEDICATIONS: Patient was receiving adequate antibiotic coverage as an inpatient ANESTHESIA/SEDATION:  Intravenous Fentanyl 57mcg and Versed 1mg  were administered as conscious sedation during continuous monitoring of the patient's level of consciousness and physiological / cardiorespiratory status by the radiology RN, with a total moderate sedation time of 46 minutes. PROCEDURE: Informed written consent was obtained from the patient and family after a thorough discussion of the procedural risks, benefits and alternatives. All questions were addressed. Maximal Sterile Barrier Technique was utilized including caps, mask, sterile gowns, sterile gloves, sterile drape, hand hygiene and skin antiseptic. A timeout was performed prior to the initiation of the procedure. The skin entry site of the right external biliary drain was infiltrated with 1% lidocaine. Retention suture was cut. Cholangiogram showed good position in the right biliary system, which was decompressed. No extravasation. The catheter was cut and exchanged over a short Amplatz wire for a 9 French short vascular sheath, through which a 5 [place] Kumpe catheter was advanced to the biliary confluence. Multiple attempts were made to traverse the obstruction using the cholangiogram and the endoscopic metallic stent as guidance in multiple projections with a variety of catheter guidewire techniques. Several false passages were created, but access to the CBD could not ultimately be achieved. Ultimately, the Kumpe catheter and sheath were exchanged back for a 12 French Dawson Mueller catheter, formed centrally within the right biliary tree access external biliary drain. The patient tolerated the procedure well. FLUOROSCOPY TIME:  21.4 minutes; [id number] uGym2 DAP COMPLICATIONS: None immediate. IMPRESSION: 1. The obstruction of the right biliary tree at the biliary confluence could not be traversed antegrade. 2. 12 Leonides Schanz catheter placed as right external biliary drain. Electronically Signed   By: Lucrezia Europe M.D.   On: 11/12/2019 17:39     Labs:  CBC: Recent Labs    11/08/19 1453 11/11/19 0310 11/12/19 0329 11/13/19 0305  WBC 9.2 7.2 8.1 6.2  HGB 10.5* 10.1* 10.3* 10.0*  HCT 32.0* 31.0* 31.7* 31.4*  PLT 324 350 397 297    COAGS: Recent Labs    10/18/19 1020 10/24/19 0833 11/07/19 1433 11/12/19 0329  INR 1.0 1.0 1.2 1.2  APTT  --   --  38*  --     BMP: Recent Labs    11/10/19 0409 11/11/19 0310 11/12/19 0329 11/13/19 0305  NA 138 140 140 143  K 4.0 3.5 3.6 3.8  CL 110 112* 113* 116*  CO2 20* 19* 18* 22  GLUCOSE 107* 113* 100* 86  BUN 15 10 7* 8  CALCIUM 8.3* 8.2* 8.2* 8.2*  CREATININE 1.06* 0.96 0.91 0.90  GFRNONAA 50* 57* >60 >60  GFRAA 58* >60 >60 >60    LIVER FUNCTION TESTS: Recent Labs    11/10/19 0409 11/11/19 0310 11/12/19 0329 11/13/19 0305  BILITOT 7.6* 7.0* 6.2* 5.1*  AST 44* 47* 50* 53*  ALT 9 7 10 16   ALKPHOS 132* 161* 163* 165*  PROT 5.1* 4.9* 5.0* 4.6*  ALBUMIN 1.8* 1.8* 1.9* 1.8*    Assessment and Plan:  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 Vernard Gambles; s/p left external biliary drain  placement and right external biliary drain exchange in IR 10/24/2019 by Dr. Annamaria Boots; right external biliary drain was inadvertently removed s/p right external biliary drain placement, left external biliary drain exchange, and right lateral abdomen biloma drain placement in IR 11/09/2019 by Dr. Earleen Newport; s/p attempted/unsuccessful transverse of central obstruction in IR 11/12/2019 by Dr. Vernard Gambles. Left external biliary drain stable with minimal output of dark bile in gravity bag. Right external biliary drain stable with approximately 200 cc dark bile in gravity bag. Right lateral abdomen biloma drain stable with approximately 50 cc dark bile in gravity bag. Tbili down to 5.1 (from 6.2 yesterday). Continue current drain management- continue with Qshift flushes/monitor of output. Further plans per TRH/GI/oncology-  appreciate and agree with management. IR to follow.   Electronically Signed: Earley Abide, PA-C 11/13/2019, 3:47 PM   I spent a total of 25 Minutes at the the patient's bedside AND on the patient's hospital floor or unit, greater than 50% of which was counseling/coordinating care for biliary obstruction s/p biliary drain placements.

## 2019-11-14 DIAGNOSIS — N179 Acute kidney failure, unspecified: Secondary | ICD-10-CM | POA: Diagnosis not present

## 2019-11-14 DIAGNOSIS — I10 Essential (primary) hypertension: Secondary | ICD-10-CM | POA: Diagnosis not present

## 2019-11-14 DIAGNOSIS — K831 Obstruction of bile duct: Secondary | ICD-10-CM | POA: Diagnosis not present

## 2019-11-14 DIAGNOSIS — R600 Localized edema: Secondary | ICD-10-CM | POA: Diagnosis not present

## 2019-11-14 LAB — COMPREHENSIVE METABOLIC PANEL WITH GFR
ALT: 9 U/L (ref 0–44)
AST: 43 U/L — ABNORMAL HIGH (ref 15–41)
Albumin: 1.7 g/dL — ABNORMAL LOW (ref 3.5–5.0)
Alkaline Phosphatase: 149 U/L — ABNORMAL HIGH (ref 38–126)
Anion gap: 6 (ref 5–15)
BUN: 8 mg/dL (ref 8–23)
CO2: 21 mmol/L — ABNORMAL LOW (ref 22–32)
Calcium: 8.2 mg/dL — ABNORMAL LOW (ref 8.9–10.3)
Chloride: 114 mmol/L — ABNORMAL HIGH (ref 98–111)
Creatinine, Ser: 0.84 mg/dL (ref 0.44–1.00)
GFR calc Af Amer: >60 mL/min
GFR calc non Af Amer: >60 mL/min
Glucose, Bld: 100 mg/dL — ABNORMAL HIGH (ref 70–99)
Potassium: 3.7 mmol/L (ref 3.5–5.1)
Sodium: 141 mmol/L (ref 135–145)
Total Bilirubin: 4.3 mg/dL — ABNORMAL HIGH (ref 0.3–1.2)
Total Protein: 4.5 g/dL — ABNORMAL LOW (ref 6.5–8.1)

## 2019-11-14 LAB — CBC
HCT: 31.8 % — ABNORMAL LOW (ref 36.0–46.0)
Hemoglobin: 10 g/dL — ABNORMAL LOW (ref 12.0–15.0)
MCH: 31.3 pg (ref 26.0–34.0)
MCHC: 31.4 g/dL (ref 30.0–36.0)
MCV: 99.4 fL (ref 80.0–100.0)
Platelets: 319 K/uL (ref 150–400)
RBC: 3.2 MIL/uL — ABNORMAL LOW (ref 3.87–5.11)
RDW: 15.9 % — ABNORMAL HIGH (ref 11.5–15.5)
WBC: 6 K/uL (ref 4.0–10.5)
nRBC: 0.3 % — ABNORMAL HIGH (ref 0.0–0.2)

## 2019-11-14 LAB — PHOSPHORUS: Phosphorus: 2.4 mg/dL — ABNORMAL LOW (ref 2.5–4.6)

## 2019-11-14 LAB — MAGNESIUM: Magnesium: 1.8 mg/dL (ref 1.7–2.4)

## 2019-11-14 LAB — AMMONIA: Ammonia: 42 umol/L — ABNORMAL HIGH (ref 9–35)

## 2019-11-14 MED ORDER — MAGNESIUM OXIDE 400 (241.3 MG) MG PO TABS
400.0000 mg | ORAL_TABLET | Freq: Three times a day (TID) | ORAL | Status: DC
  Administered 2019-11-14 – 2019-11-18 (×13): 400 mg via ORAL
  Filled 2019-11-14 (×13): qty 1

## 2019-11-14 NOTE — Evaluation (Signed)
Physical Therapy Evaluation Patient Details Name: Hailey [name] MRN: [id number] DOB: Oct 13, 1940 Today's Date: 11/14/2019   History of Present Illness  Pt is a 79 y.o. F with significant PMH of HTN, Parkinson disease, arthritis who presents with biliary obstruction. Pt with history of Klatskin tumor causing biliary obstruction, s/p right external biliary drain placement in IR 10/18/2019 by Dr. Vernard Gambles; s/p left external biliary drain placement and right external biliary drain exchange in IR 10/24/2019 by Dr. Annamaria Boots; right external biliary drain was inadvertently removed s/p right external biliary drain placement, left external biliary drain exchang, and right lateral abdomen biloma drain placement in IR 11/09/2019 by Dr. Earleen Newport; s/p attempted/unsuccessful transverse of central obstruction in IR 11/12/2019 by Dr. Vernard Gambles.   Clinical Impression  Prior to admission, pt had been living with her family for ~1 month and requiring increased assist for transfers/ambulation and ADL's. She typically lives alone and uses a Radiation protection practitioner. On PT evaluation, pt presents with decreased functional mobility secondary to weakness, decreased functional use of LUE, poor balance, posterior lean, and decreased cognition. Requiring two person moderate assist to stand; ambulating 5 feet with a walker and close chair follow. Will need post acute rehab upon discharge.     Follow Up Recommendations SNF;Supervision/Assistance - 24 hour    Equipment Recommendations  Other (comment)(defer)    Recommendations for Other Services       Precautions / Restrictions Precautions Precautions: Fall;Other (comment) Precaution Comments: x3 biliary drains Restrictions Weight Bearing Restrictions: No      Mobility  Bed Mobility Overal bed mobility: Needs Assistance Bed Mobility: Supine to Sit     Supine to sit: Mod assist;+2 for physical assistance     General bed mobility comments: ModA + 2 to help BLE's off edge of bed and pull  trunk to upright position  Transfers Overall transfer level: Needs assistance Equipment used: Rolling walker (2 wheeled) Transfers: Sit to/from Stand Sit to Stand: Mod assist;+2 physical assistance         General transfer comment: ModA + 2 to rise from elevated surface. Decreased eccentric control  Ambulation/Gait Ambulation/Gait assistance: Min assist;+2 physical assistance Gait Distance (Feet): 5 Feet Assistive device: Rolling walker (2 wheeled) Gait Pattern/deviations: Step-to pattern;Decreased stride length;Shuffle;Trunk flexed;Narrow base of support Gait velocity: decreased Gait velocity interpretation: <1.31 ft/sec, indicative of household ambulator General Gait Details: Max cues for sequencing, stepping initiation, manual assist provided for negotiation of walker. Close chair follow utilized. Slow pace  Stairs            Wheelchair Mobility    Modified Rankin (Stroke Patients Only)       Balance Overall balance assessment: Needs assistance Sitting-balance support: Feet supported Sitting balance-Leahy Scale: Fair     Standing balance support: Bilateral upper extremity supported Standing balance-Leahy Scale: Poor Standing balance comment: reliant on external support                             Pertinent Vitals/Pain Pain Assessment: Faces Faces Pain Scale: Hurts little more Pain Location: L shoulder Pain Descriptors / Indicators: Grimacing;Guarding Pain Intervention(s): Monitored during session;Repositioned    Home Living Family/patient expects to be discharged to:: Private residence Living Arrangements: Alone Available Help at Discharge: Family;Available PRN/intermittently Type of Home: House Home Access: Stairs to enter   Entrance Stairs-Number of Steps: 1 Home Layout: One level Home Equipment: Walker - 4 wheels;Shower seat      Prior Function Level of Independence: Needs assistance  Gait / Transfers Assistance Needed: Pt has  been requiring increased level of assist past 3 weeks - at times needs assist to stand, significant amount of time required to walk  ADL's / Homemaking Assistance Needed: Has been needing increased level of assist the past 3 weeks - pt sister reports she takes "45 minutes to dress."        Hand Dominance        Extremity/Trunk Assessment   Upper Extremity Assessment Upper Extremity Assessment: RUE deficits/detail;LUE deficits/detail RUE Deficits / Details: Shoulder flexion AROM WFL LUE Deficits / Details: Shoulder flexion to ~10 degrees (limited by pain), elbow/wrist flexion [facility]    Lower Extremity Assessment Lower Extremity Assessment: Generalized weakness    Cervical / Trunk Assessment Cervical / Trunk Assessment: Kyphotic  Communication   Communication: No difficulties  Cognition Arousal/Alertness: Awake/alert Behavior During Therapy: WFL for tasks assessed/performed Overall Cognitive Status: Impaired/Different from baseline Area of Impairment: Orientation;Problem solving;Safety/judgement;Awareness                 Orientation Level: Disoriented to;Time       Safety/Judgement: Decreased awareness of safety;Decreased awareness of deficits Awareness: Intellectual Problem Solving: Slow processing;Difficulty sequencing;Requires verbal cues;Requires tactile cues;Decreased initiation General Comments: Pt not oriented to year, stating it was 2011 then 2001. Requires cues for problem solving, initiation.       General Comments      Exercises     Assessment/Plan    PT Assessment Patient needs continued PT services  PT Problem List Decreased strength;Decreased activity tolerance;Decreased balance;Decreased mobility;Decreased cognition;Decreased safety awareness;Pain       PT Treatment Interventions DME instruction;Gait training;Functional mobility training;Therapeutic activities;Therapeutic exercise;Balance training;Patient/family education    PT Goals (Current  goals can be found in the Care Plan section)  Acute Rehab PT Goals Patient Stated Goal: not fall PT Goal Formulation: With patient Time For Goal Achievement: 11/28/19 Potential to Achieve Goals: Fair    Frequency Min 2X/week   Barriers to discharge        Co-evaluation               AM-PAC PT "6 Clicks" Mobility  Outcome Measure Help needed turning from your back to your side while in a flat bed without using bedrails?: A Little Help needed moving from lying on your back to sitting on the side of a flat bed without using bedrails?: A Lot Help needed moving to and from a bed to a chair (including a wheelchair)?: A Lot Help needed standing up from a chair using your arms (e.g., wheelchair or bedside chair)?: A Lot Help needed to walk in hospital room?: A Lot Help needed climbing 3-5 steps with a railing? : Total 6 Click Score: 12    End of Session Equipment Utilized During Treatment: Gait belt Activity Tolerance: Patient tolerated treatment well Patient left: in chair;with call bell/phone within reach;with chair alarm set Nurse Communication: Mobility status PT Visit Diagnosis: Unsteadiness on feet (R26.81);Muscle weakness (generalized) (M62.81);Difficulty in walking, not elsewhere classified (R26.2)    Time: [id number] PT Time Calculation (min) (ACUTE ONLY): 30 min   Charges:   PT Evaluation $PT Eval Moderate Complexity: 1 Mod PT Treatments $Gait Training: 8-22 mins          Wyona Almas, PT, DPT Acute Rehabilitation Services Pager [phone] Office [phone]   Deno Etienne 11/14/2019, 5:43 PM

## 2019-11-14 NOTE — Progress Notes (Signed)
PROGRESS NOTE    Hailey [name]  [id number] DOB: 1941-07-02 DOA: 11/07/2019 PCP: Prince Solian, MD    Brief Narrative:  Patient was admitted to the hospital with a working diagnosis of biliary obstruction.  79 year old female who has past medical history for hypertension, arthritis, Parkinson's disease, and chronic lower extremity edema.  Patient is known to have a biliary obstruction, which has been worked up as an outpatient, she had multiple attempts to relieve obstruction, per IR and ERCP.  She had a left sided external biliary drain placed, and was used to perform cholangiogram. Patient underwent a new left intrahepatic drain placement.  Postprocedure she was admitted to the hospital due to risk of infection.   Patient was placed on broad-spectrum antibiotic therapy and intravenous fluids. On April 20 she had an unsuccessful attempt to bypass the biliary obstruction.   Patient will be referred for tertiary care for further interventions.  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  Assessment & Plan:   Principal Problem:   Biliary obstruction Active Problems:   Parkinson's disease ([place])   Hypertension   AKI (acute kidney injury) ([place])   Hyponatremia   Leukocytosis   Normocytic anemia   Bilateral leg edema   Increased ammonia level  1. 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 Right lateral abdomen biloma drain. Sp unsuccessful transverse obstruction intervention. Wbc today is 6,0, cultures with no growth. Patient has been afebrile and tolerating clears. AST 43, ALT 9, T bil 4,3  Will continue antibiotic therapy with IV Zosyn and will advance diet. In preparation for discharge will order physical and occupational therapy. Plan to discharge on oral antibiotic therapy and follow as outpatient. Patient with no tissue diagnosis, but very high pretest probability for  cholangiocarcinoma.   2. AKI, hyponatremia.  Stable renal function with serum cr at 0,84, K at 3,7 and Na at 141. Mg at 1,8. Will add oral mag oxide and will follow up renal function in 48 H. Will advance diet to soft.   3. Metabolic encephalopathy. Patient awake and alert, no confusion or agitation. Will continue neuro checks per unit protocol. Add multivitamins and thiamine. Consult nutrition.   4. Parkinson's disease. Follow with physical therapy, patient using a walker before admission. Continue with Sinemet.   5. HTN. Continue blood pressure control with metoprolol.   6. Multifactorial anemia. Hgb and Hct stable at 10.0 and hct 31,8, no indication for PRBC transfusion.   DVT prophylaxis: Enoxaparin  Code Status:   full  Family Communication:   no family at the beside Disposition Plan:   Patient is from:   home   Anticipated DC to:  Home   Anticipated DC date:  04.23   Anticipated DC barriers: Continue need of antibiotic therapy and need to monitor response.      Consultants:   GI   IR   Procedures:   Biliary drain replaced  Antimicrobials:   Zosyn     Subjective: Patient continue to be on clear liquid diet and has not worked with physical therapy, no nausea or vomiting, no chest pain or dyspnea.   Objective: Vitals:   11/13/19 0422 11/13/19 1513 11/13/19 2147 11/14/19 0614  BP: (!) 153/63 (!) 157/62 130/62 (!) 145/70  Pulse: (!) 52 (!) 52 (!) 54 (!) 53  Resp: 18 17 14 17   Temp: 97.7 F (36.5 C) 98 F (36.7 C) (!) 97.4 F (36.3 C) 98.3  F (36.8 C)  TempSrc: Oral Oral Oral Oral  SpO2: 99% 98% 98% 100%  Weight:      Height:        Intake/Output Summary (Last 24 hours) at 11/14/2019 1347 Last data filed at 11/14/2019 0900 Gross per 24 hour  Intake 5254.75 ml  Output 1490 ml  Net 3764.75 ml   Filed Weights   11/07/19 0802 11/10/19 0500 11/11/19 0438  Weight: 66.7 kg 68.8 kg 71.6 kg    Examination:   General: Not in pain or dyspnea, deconditioned  and ill looking appearing Neurology: Awake and alert, non focal  E ENT: positive pallor and icterus, oral mucosa moist Cardiovascular: No JVD. S1-S2 present, rhythmic, no gallops, rubs, or murmurs. No lower extremity edema. Pulmonary: positive breath sounds bilaterally, adequate air movement, no wheezing, rhonchi or rales. Gastrointestinal. Abdomen with, no organomegaly, non tender, no rebound or guarding Skin. No rashes Musculoskeletal: no joint deformities     Data Reviewed: I have personally reviewed following labs and imaging studies  CBC: Recent Labs  Lab 11/08/19 0242 11/08/19 0242 11/08/19 1453 11/11/19 0310 11/12/19 0329 11/13/19 0305 11/14/19 0143  WBC 11.7*   < > 9.2 7.2 8.1 6.2 6.0  NEUTROABS 10.1*  --  8.1*  --   --   --   --   HGB 9.6*   < > 10.5* 10.1* 10.3* 10.0* 10.0*  HCT 29.5*   < > 32.0* 31.0* 31.7* 31.4* 31.8*  MCV 98.0   < > 98.8 97.2 97.2 99.4 99.4  PLT 287   < > 324 350 397 297 319   < > = values in this interval not displayed.   Basic Metabolic Panel: Recent Labs  Lab 11/10/19 0409 11/11/19 0310 11/12/19 0329 11/13/19 0305 11/14/19 0143  NA 138 140 140 143 141  K 4.0 3.5 3.6 3.8 3.7  CL 110 112* 113* 116* 114*  CO2 20* 19* 18* 22 21*  GLUCOSE 107* 113* 100* 86 100*  BUN 15 10 7* 8 8  CREATININE 1.06* 0.96 0.91 0.90 0.84  CALCIUM 8.3* 8.2* 8.2* 8.2* 8.2*  MG 2.1 2.0 1.9 1.9 1.8  PHOS 2.5 2.1* 2.1* 2.6 2.4*   GFR: Estimated Creatinine Clearance: 54.7 mL/min (by C-G formula based on SCr of 0.84 mg/dL). Liver Function Tests: Recent Labs  Lab 11/10/19 0409 11/11/19 0310 11/12/19 0329 11/13/19 0305 11/14/19 0143  AST 44* 47* 50* 53* 43*  ALT 9 7 10 16 9   ALKPHOS 132* 161* 163* 165* 149*  BILITOT 7.6* 7.0* 6.2* 5.1* 4.3*  PROT 5.1* 4.9* 5.0* 4.6* 4.5*  ALBUMIN 1.8* 1.8* 1.9* 1.8* 1.7*   No results for input(s): LIPASE, AMYLASE in the last 168 hours. Recent Labs  Lab 11/11/19 1243 11/13/19 0305 11/14/19 0143  AMMONIA 51* 51*  42*   Coagulation Profile: Recent Labs  Lab 11/07/19 1433 11/12/19 0329  INR 1.2 1.2   Cardiac Enzymes: No results for input(s): CKTOTAL, CKMB, CKMBINDEX, TROPONINI in the last 168 hours. BNP (last 3 results) No results for input(s): PROBNP in the last 8760 hours. HbA1C: No results for input(s): HGBA1C in the last 72 hours. CBG: No results for input(s): GLUCAP in the last 168 hours. Lipid Profile: No results for input(s): CHOL, HDL, LDLCALC, TRIG, CHOLHDL, LDLDIRECT in the last 72 hours. Thyroid Function Tests: No results for input(s): TSH, T4TOTAL, FREET4, T3FREE, THYROIDAB in the last 72 hours. Anemia Panel: No results for input(s): VITAMINB12, FOLATE, FERRITIN, TIBC, IRON, RETICCTPCT in the last 72 hours.  Radiology Studies: I have reviewed all of the imaging during this hospital visit personally     Scheduled Meds: . aspirin EC  81 mg Oral Daily  . carbidopa-levodopa  1 tablet Oral TID  . lactulose  30 g Oral BID  . metoprolol succinate  100 mg Oral Daily  . sodium chloride flush  5 mL Intracatheter Q8H  . sodium chloride flush  5 mL Intracatheter Q8H   Continuous Infusions: . dextrose 5 % and 0.9% NaCl 75 mL/hr at 11/14/19 0655  . piperacillin-tazobactam (ZOSYN)  IV 3.375 g (11/14/19 0620)     LOS: 7 days        Daray Polgar Gerome Apley, MD

## 2019-11-14 NOTE — Progress Notes (Signed)
Referring Physician(s): Magod,M  Supervising Physician: Corrie Mckusick  Patient Status:  [name] - In-pt  Chief Complaint:  Jaundice, biliary obstruction/Klatskin tumor  Subjective: Pt sitting up in chair; denies any sig abd pain,N/V; tol diet ok; family in room   Allergies: Merthiolate [thimerosal] and Forteo [parathyroid hormone (recomb)]  Medications: Prior to Admission medications   Medication Sig Start Date End Date Taking? Authorizing Provider  amoxicillin (AMOXIL) 500 MG capsule Take 2,000 mg by mouth See admin instructions. Take 4 capsules (2000 mg) by mouth one hour prior to dental appointment 07/31/15  Yes [provider]  aspirin EC 81 MG tablet Take 81 mg by mouth daily.   Yes [provider]  Calcium Carbonate-Vitamin D (CALTRATE 600+D) 600-400 MG-UNIT per tablet Take 1 tablet by mouth daily.   Yes [provider]  carbidopa-levodopa (SINEMET IR) 25-100 MG tablet TAKE 1 TABLET BY MOUTH THREE TIMES DAILY, TAKE 4 HOURS APART Patient taking differently: Take 1 tablet by mouth 3 (three) times daily. 0800, 1200, 1600 05/14/19  Yes Lomax, Amy, NP  furosemide (LASIX) 20 MG tablet Take 20 mg by mouth daily.    Yes [provider]  guaiFENesin (MUCINEX) 600 MG 12 hr tablet Take 600 mg by mouth 2 (two) times daily as needed (congestion).   Yes [provider]  metoprolol succinate (TOPROL-XL) 100 MG 24 hr tablet Take 100 mg by mouth daily. Take with or immediately following a meal.   Yes [provider]  oxycodone (OXY-IR) 5 MG capsule Take 1 capsule (5 mg total) by mouth every 8 (eight) hours as needed. Patient taking differently: Take 5 mg by mouth every 8 (eight) hours as needed for pain.  11/06/19  Yes Virgel Manifold, MD  Polyethyl Glycol-Propyl Glycol (SYSTANE OP) Place 1 drop into both eyes daily as needed (dry eyes).   Yes [provider]  vitamin C (ASCORBIC ACID) 250 MG tablet Take 500 mg by mouth daily.   Yes  [provider]  Vitamin D, Ergocalciferol, (DRISDOL) 50000 units CAPS capsule Take 50,000 Units by mouth 2 (two) times a week. Tuesdays and Fridays   Yes [provider]     Vital Signs: BP (!) 146/65 (BP Location: Right Arm)   Pulse (!) 55   Temp 97.7 F (36.5 C) (Oral)   Resp 18   Ht 5\' 5"  (1.651 m)   Wt 157 lb 13.6 oz (71.6 kg)   SpO2 100%   BMI 26.27 kg/m   Physical Exam awake/conversant; remains jaundiced but less than on previous days;  Left/right biliary drains and right biloma drain sites without tenderness, erythema, drainage, or active bleeding; Left external biliary drain with 30 cc output of dark bile in gravity bag; right ext drain with 530 cc OP, right biloma drain with 20 cc  Imaging: CT ABDOMEN WO CONTRAST  Result Date: 11/11/2019 CLINICAL DATA:  79 year old with biliary obstruction and status post percutaneous drains. In addition, the patient has a perihepatic drainage catheter. EXAM: CT ABDOMEN WITHOUT CONTRAST TECHNIQUE: Multidetector CT imaging of the abdomen was performed following the standard protocol without IV contrast. COMPARISON:  CT 11/06/2019 FINDINGS: Lower chest: Patient has developed a small right pleural effusion. Small amount of compressive atelectasis at the right lung base. Hepatobiliary: Currently, there is a left external biliary drain and right external biliary drain. There is a metallic stent in the region of the common hepatic duct that extends into the left biliary system. There is pneumobilia associated with this metallic  biliary stent and the associated left biliary duct. Lack of IV contrast limits evaluation of the biliary system but there continues to be evidence for intrahepatic biliary dilatation. Narrowing of the metallic biliary stent near the central aspect of the liver compatible with an underlying liver or biliary lesion. There is a percutaneous drain adjacent to the lateral aspect of the right hepatic lobe. Small residual  fluid collection along the inferior aspect of the drain and inferior aspect of the right hepatic lobe. Craniocaudal dimension of the collection is not completely visualized but the collection measures greater than 4.8 cm in the craniocaudal dimension. Collection measures 3.9 x 2.2 cm on the axial images, sequence 3, image 43. Pancreas: Stable appearance of the pancreas without significant duct dilatation. Again noted is a round low-density structure just anterior to the pancreatic body and neck region on sequence 3, image 24. This is similar to previous examinations and measures 2.3 x 3.2 cm. Spleen: Normal in size without focal abnormality. Adrenals/Urinary Tract: Normal adrenal glands. Normal appearance of both kidneys without hydronephrosis. Stomach/Bowel: Normal appearance of stomach. Visualized small and large bowel are unremarkable. No evidence for bowel obstruction. Vascular/Lymphatic: Atherosclerotic calcifications in the abdominal aorta without aneurysm. Other: Small pocket of intraperitoneal air anterior to the left hepatic lobe on sequence 3, image 21. This area is near the left hepatic drain and likely associated with the drain. Musculoskeletal: Facet arthropathy in the lower lumbar spine. IMPRESSION: 1. Bilateral biliary drains are in place. These are external biliary drains that do not extend into the extrahepatic biliary system or duodenum. Evidence for residual intrahepatic biliary dilatation. 2. Metallic biliary stent is located in the common hepatic duct and extends into lateral left hepatic lobe. There is pneumobilia associated with this biliary stent. Marked narrowing in the midportion of stent compatible with underlying biliary or liver lesion. 3. Residual fluid collection in the right inferior perihepatic space. This fluid collection is just caudal to the percutaneous drain and may not be communicating with the drain. 4. Stable rounded lesion anterior to the pancreas. This has been described on  previous examinations and may represent a pseudo cyst but indeterminate. 5. New small right pleural effusion. Electronically Signed   By: Markus Daft M.D.   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 New right-sided external biliary drain catheter placed 11/08/2019. Biloma drain catheter placed lateral to the liver. EXAM: EXCHANGE OF EXTERNAL BILIARY DRAIN CATHETER UNDER FLUOROSCOPY MEDICATIONS: Patient was receiving adequate antibiotic coverage as an inpatient ANESTHESIA/SEDATION: Intravenous Fentanyl 43mcg and Versed 1mg  were administered as conscious sedation during continuous monitoring of the patient's level of consciousness and physiological / cardiorespiratory status by the radiology RN, with a total moderate sedation time of 46 minutes. PROCEDURE: Informed written consent was obtained from the patient and family after a thorough discussion of the procedural risks, benefits and alternatives. All questions were addressed. Maximal Sterile Barrier Technique was utilized including caps, mask, sterile gowns, sterile gloves, sterile drape, hand hygiene and skin antiseptic. A timeout was performed prior to the initiation of the procedure. The skin entry site of the right external biliary drain was infiltrated with 1% lidocaine. Retention suture was cut. Cholangiogram showed good position in the right biliary system, which was decompressed. No extravasation. The catheter  was cut and exchanged over a short Amplatz wire for a 9 French short vascular sheath, through which a 5 [place] Kumpe catheter was advanced to the biliary confluence. Multiple attempts were made to traverse the obstruction using the cholangiogram and the  endoscopic metallic stent as guidance in multiple projections with a variety of catheter guidewire techniques. Several false passages were created, but access to the CBD could not ultimately be achieved. Ultimately, the Kumpe catheter and sheath were exchanged back for a 12 French Dawson Mueller catheter, formed centrally within the right biliary tree access external biliary drain. The patient tolerated the procedure well. FLUOROSCOPY TIME:  21.4 minutes; [id number] uGym2 DAP COMPLICATIONS: None immediate. IMPRESSION: 1. The obstruction of the right biliary tree at the biliary confluence could not be traversed antegrade. 2. 12 Leonides Schanz catheter placed as right external biliary drain. Electronically Signed   By: Lucrezia Europe M.D.   On: 11/12/2019 17:39    Labs:  CBC: Recent Labs    11/11/19 0310 11/12/19 0329 11/13/19 0305 11/14/19 0143  WBC 7.2 8.1 6.2 6.0  HGB 10.1* 10.3* 10.0* 10.0*  HCT 31.0* 31.7* 31.4* 31.8*  PLT 350 397 297 319    COAGS: Recent Labs    10/18/19 1020 10/24/19 0833 11/07/19 1433 11/12/19 0329  INR 1.0 1.0 1.2 1.2  APTT  --   --  38*  --     BMP: Recent Labs    11/11/19 0310 11/12/19 0329 11/13/19 0305 11/14/19 0143  NA 140 140 143 141  K 3.5 3.6 3.8 3.7  CL 112* 113* 116* 114*  CO2 19* 18* 22 21*  GLUCOSE 113* 100* 86 100*  BUN 10 7* 8 8  CALCIUM 8.2* 8.2* 8.2* 8.2*  CREATININE 0.96 0.91 0.90 0.84  GFRNONAA 57* >60 >60 >60  GFRAA >60 >60 >60 >60    LIVER FUNCTION TESTS: Recent Labs    11/11/19 0310 11/12/19 0329 11/13/19 0305 11/14/19 0143  BILITOT 7.0* 6.2* 5.1* 4.3*  AST 47* 50* 53* 43*  ALT 7 10 16 9   ALKPHOS 161* 163* 165* 149*  PROT 4.9* 5.0* 4.6* 4.5*  ALBUMIN 1.8* 1.9* 1.8* 1.7*    Assessment and Plan: Pt with history of Klatskin tumor causing biliary obstruction, s/p right external biliary drain placement in IR 10/18/2019 by Dr. Vernard Gambles; s/p left external biliary drain placement and right external biliary drain exchange  in IR 10/24/2019 by Dr. Annamaria Boots; right external biliary drain was inadvertently removed s/p right external biliary drain placement, left external biliary drain exchange, and right lateral abdomen biloma drain placement in IR 11/09/2019 by Dr. Earleen Newport; s/p attempted/unsuccessful transverse of central obstruction in IR 11/12/2019 by Dr. Vernard Gambles. Afebrile, WBC nl; hgb stable; creat nl; t bili 4.3(5.1); send bile for cytology; cont drain irrigation; rec surgical/oncology consults? tertiary center; OP IR f/u order placed   Electronically Signed: D. Rowe Robert, PA-C 11/14/2019, 5:04 PM   I spent a total of 15 minutes at the the patient's bedside AND on the patient's hospital floor or unit, greater than 50% of which was counseling/coordinating care for biliary /biloma drains    Patient ID: Hailey [name], female   DOB: 1941/05/22, 79 y.o.   MRN: [id number]

## 2019-11-15 DIAGNOSIS — R600 Localized edema: Secondary | ICD-10-CM | POA: Diagnosis not present

## 2019-11-15 DIAGNOSIS — I10 Essential (primary) hypertension: Secondary | ICD-10-CM | POA: Diagnosis not present

## 2019-11-15 DIAGNOSIS — K831 Obstruction of bile duct: Secondary | ICD-10-CM | POA: Diagnosis not present

## 2019-11-15 DIAGNOSIS — N179 Acute kidney failure, unspecified: Secondary | ICD-10-CM | POA: Diagnosis not present

## 2019-11-15 MED ORDER — HYDROCODONE-ACETAMINOPHEN 5-325 MG PO TABS: 1.0000 | ORAL_TABLET | ORAL | Status: DC | PRN

## 2019-11-15 MED ORDER — ENSURE ENLIVE PO LIQD
237.0000 mL | Freq: Two times a day (BID) | ORAL | Status: DC
  Administered 2019-11-15 – 2019-11-18 (×5): 237 mL via ORAL

## 2019-11-15 MED ORDER — ADULT MULTIVITAMIN W/MINERALS CH
1.0000 | ORAL_TABLET | Freq: Every day | ORAL | Status: DC
  Administered 2019-11-15 – 2019-11-18 (×4): 1 via ORAL
  Filled 2019-11-15 (×4): qty 1

## 2019-11-15 NOTE — Progress Notes (Signed)
Contacted IR MD oncall regarding need for 2 additonal orders for biliary drain specimens; Per McColllough, MD test one specimen. Cytology made aware.

## 2019-11-15 NOTE — Progress Notes (Signed)
Initial Nutrition Assessment  DOCUMENTATION CODES:   Not applicable  INTERVENTION:   -Ensure Enlive po BID, each supplement provides 350 kcal and 20 grams of protein -MVI with minerals daily -Magic cup TID with meals, each supplement provides 290 kcal and 9 grams of protein  NUTRITION DIAGNOSIS:   Inadequate oral intake related to altered GI function, decreased appetite as evidenced by meal completion < 50%.  GOAL:   Patient will meet greater than or equal to 90% of their needs  MONITOR:   PO intake, Supplement acceptance, Labs, Weight trends, Skin, I & O's  REASON FOR ASSESSMENT:   Consult Assessment of nutrition requirement/status  ASSESSMENT:   Hailey [name] is a 79 y.o. 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  Presents to IR today for evaluation of her external biliary drains as well as ongoing attempts of internalization and possible brush biopsies.  Pt admitted with biliary obstruction.   4/16- s/p lt biliary drain exchange, rt biliary drain placed; large biloma adjacent to liver- drain placed for relief 4/20- s/p rt biliary tube exchange  Reviewed I/O's: +1.6 L x 24 hours and +5.6 L since admission  UOP: 350 ml x 24 hours   Drain output: 595 ml x 24 hours  Pt in with MAD at time of visit.  Meal completion has been variable since admission; noted documented oral intake 25-75%.   Wt has been stable over the past year.   Albumin has a half-life of 21 days and is strongly affected by stress response and inflammatory process, therefore, do not expect to see an improvement in this lab value during acute hospitalization. Albumin has a half-life of 21 days and is strongly affected by stress response and inflammatory process, therefore, do not expect to see an improvement in this lab value during acute hospitalization.  Per RNCM  notes, plan to d/c to SNF once medically stable.   Labs reviewed.   Diet Order:   Diet Order            DIET SOFT Room service appropriate? Yes; Fluid consistency: Thin  Diet effective now              EDUCATION NEEDS:   No education needs have been identified at this time  Skin:  Skin Assessment: Skin Integrity Issues: Skin Integrity Issues:: Incisions Incisions: rt knee  Last BM:  11/15/19  Height:   Ht Readings from Last 1 Encounters:  11/07/19 5\' 5"  (1.651 m)    Weight:   Wt Readings from Last 1 Encounters:  11/11/19 71.6 kg    Ideal Body Weight:  56.8 kg  BMI:  Body mass index is 26.27 kg/m.  Estimated Nutritional Needs:   Kcal:  1950-2150  Protein:  95-110 grams  Fluid:  >1.9 L    Loistine Chance, RD, LDN, [place] Registered Dietitian II Certified Diabetes Care and Education Specialist Please refer to [facility] for RD and/or RD on-call/weekend/after hours pager

## 2019-11-15 NOTE — Progress Notes (Addendum)
Order for fluid to be collected for cytoscopy can not be performed as the order was written as care instruction. Will need requisition to perform this task; MCCullough, MD made aware; Awaiting further instruction

## 2019-11-15 NOTE — [place] FL2 (Signed)
[place] MEDICAID FL2 LEVEL OF CARE SCREENING TOOL     IDENTIFICATION  Patient Name: Hailey [name] Birthdate: 1941-04-14 Sex: female Admission Date (Current Location): 11/07/2019  [facility] and [place] Number:  Herbalist and Address:  [facility], [place] [address], [place], [place] [address]      Provider Number: [id number]  Attending Physician Name and Address:  Tawni Millers,*  Relative Name and Phone Number:  Suzan Paulick [phone]    Current Level of Care: Hospital Recommended Level of Care: [place] Prior Approval Number:    Date Approved/Denied:   PASRR Number: [id number]  Discharge Plan: SNF    Current Diagnoses: Patient Active Problem List   Diagnosis Date Noted  . Increased ammonia level 11/11/2019  . Hypertension   . Biliary obstruction   . AKI (acute kidney injury) ([place])   . Hyponatremia   . Leukocytosis   . Normocytic anemia   . Bilateral leg edema   . OA (osteoarthritis) of knee 04/09/2018  . Parkinson's disease ([place]) 05/09/2017  . Parkinsonism ([place]) 02/14/2017  . BPPV (benign paroxysmal positional vertigo) 11/10/2014  . 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    Orientation RESPIRATION BLADDER Height & Weight     Self, Time, Situation, Place  Normal Continent Weight: 71.6 kg Height:  5\' 5"  (165.1 cm)  BEHAVIORAL SYMPTOMS/MOOD NEUROLOGICAL BOWEL NUTRITION STATUS      Continent Diet  AMBULATORY STATUS COMMUNICATION OF NEEDS Skin   Extensive Assist Verbally Other (Comment)(PAtient has three biliary drains, flushing each one with 5 mL TID dressing change daily and recording drainage)                       Personal Care Assistance Level of Assistance  Bathing, Dressing, Feeding Bathing Assistance: Maximum assistance Feeding assistance: Limited assistance Dressing Assistance: Maximum assistance     Functional Limitations Info             SPECIAL CARE FACTORS FREQUENCY  PT  (By licensed PT), OT (By licensed OT)     PT Frequency: five times a week OT Frequency: five times a week            Contractures Contractures Info: Not present    Additional Factors Info  Code Status, Allergies Code Status Info: Full Allergies Info: Merthiolate, Forteo           Current Medications (11/15/2019):  This is the current hospital active medication list Current Facility-Administered Medications  Medication Dose Route Frequency Provider Last Rate Last Admin  . acetaminophen (TYLENOL) tablet 650 mg  650 mg Oral Q6H PRN Pahwani, Rinka R, MD   650 mg at 11/10/19 1928   Or  . acetaminophen (TYLENOL) suppository 650 mg  650 mg Rectal Q6H PRN Pahwani, Rinka R, MD      . aspirin EC tablet 81 mg  81 mg Oral Daily Pahwani, Rinka R, MD   81 mg at 11/15/19 0932  . carbidopa-levodopa (SINEMET IR) 25-100 MG per tablet immediate release 1 tablet  1 tablet Oral TID Mckinley Jewel, MD   1 tablet at 11/15/19 0932  . HYDROcodone-acetaminophen (NORCO/VICODIN) 5-325 MG per tablet 1-2 tablet  1-2 tablet Oral Q4H PRN Arne Cleveland, MD   1 tablet at 11/14/19 1449  . lactulose (CHRONULAC) 10 GM/15ML solution 30 g  30 g Oral BID Allie Bossier, MD   30 g at 11/15/19 0930  . magnesium oxide (MAG-OX) tablet 400 mg  400 mg Oral TID Tawni Millers, MD   400 mg at 11/15/19 0932  . metoprolol succinate (TOPROL-XL) 24 hr tablet 100 mg  100 mg Oral Daily Pahwani, Rinka R, MD   100 mg at 11/15/19 0931  . ondansetron (ZOFRAN) tablet 4 mg  4 mg Oral Q6H PRN Pahwani, Rinka R, MD       Or  . ondansetron (ZOFRAN) injection 4 mg  4 mg Intravenous Q6H PRN Pahwani, Rinka R, MD      . piperacillin-tazobactam (ZOSYN) IVPB 3.375 g  3.375 g Intravenous Q8H Benetta Spar D, RPH 12.5 mL/hr at 11/15/19 0554 3.375 g at 11/15/19 0554  . sodium chloride flush (NS) 0.9 % injection 5 mL  5 mL Intracatheter Q8H Corrie Mckusick, DO   5 mL at 11/15/19 0538  . sodium chloride flush (NS) 0.9 % injection 5 mL  5 mL  Intracatheter Q8H Arne Cleveland, MD   5 mL at 11/15/19 [id number]     Discharge Medications: Please see discharge summary for a list of discharge medications.  Relevant Imaging Results:  Relevant Lab Results:   Additional Information SSI [phone], No tissue diagnosis but very high pretest probability for cholangiocarcinoma, follow up at tertiary care for further diagnositics  Roisin Mones, Edson Snowball, RN

## 2019-11-15 NOTE — TOC Initial Note (Addendum)
Transition of Care [facility]) - Initial/Assessment Note    Patient Details  Name: Hailey [name] MRN: [id number] Date of Birth: [birth year]  Transition of [facility]) CM/SW Contact:    Marilu Favre, RN Phone Number: 11/15/2019, 2:09 PM  Clinical Narrative:                  Spoke to patient this morning with sister in law Glenda at bedside. Patient from home with walker. Discussed PT recommendation for SNF. Patient and family agreeable. Patient has not been to a SNF before, her preference is [facility], but agreeable for NCM to fax to other SNF's. Patient and family aware NCM will start insurance authorization and will provide bed offers once received.  Angel at [facility] [facility] aware of patient's preference and will review referral.    [id number] Provided patient with bed offers with medicare.gov list. Patient has accepted bed offer at [facility].   Insurance authorization has been started ref [id number] , clinicals were faxed in to [phone]  Expected Discharge Plan: [place] Barriers to Discharge: Continued Medical Work up   Patient Goals and CMS Choice Patient states their goals for this hospitalization and ongoing recovery are:: rehab CMS Medicare.gov Compare Post Acute Care list provided to:: Patient Choice offered to / list presented to : Patient, Sibling  Expected Discharge Plan and Services Expected Discharge Plan: [place]   Discharge Planning Services: CM Consult Post Acute Care Choice: [place] Living arrangements for the past 2 months: Single Family Home                 DME Arranged: N/A         HH Arranged: NA          Prior Living Arrangements/Services Living arrangements for the past 2 months: Single Family Home Lives with:: Self Patient language and need for interpreter reviewed:: Yes Do you feel safe going back to the place where you live?: No      Need for Family Participation in Patient Care: Yes  (Comment) Care giver support system in place?: Yes (comment) Current home services: DME Criminal Activity/Legal Involvement Pertinent to Current Situation/Hospitalization: No - Comment as needed  Activities of Daily Living      Permission Sought/Granted   Permission granted to share information with : Yes, Verbal Permission Granted  Share Information with NAME: sister in law Bluford Main           Emotional Assessment Appearance:: Appears stated age Attitude/Demeanor/Rapport: Engaged Affect (typically observed): Accepting Orientation: : Oriented to Place, Oriented to  Time, Oriented to Situation, Oriented to Self Alcohol / Substance Use: Not Applicable Psych Involvement: No (comment)  Admission diagnosis:  Liver mass [R16.0] Jaundice [R17] 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 Increased ammonia level 11/11/2019  . Hypertension   . Biliary obstruction   . AKI (acute kidney injury) ([place])   . Hyponatremia   . Leukocytosis   . Normocytic anemia   . Bilateral leg edema   . OA (osteoarthritis) of knee 04/09/2018  . Parkinson's disease ([place]) 05/09/2017  . Parkinsonism ([place]) 02/14/2017  . BPPV (benign paroxysmal positional vertigo) 11/10/2014  . 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 [place] [address] Phone: [phone] Fax: [phone]  Social Determinants of Health (SDOH) Interventions    Readmission Risk Interventions No flowsheet data found.

## 2019-11-15 NOTE — Progress Notes (Signed)
Referring Physician(s): Magod,M  Supervising Physician: Jacqulynn Cadet  Patient Status:  [facility] - In-pt  Chief Complaint:  Jaundice, biliary obstruction/Klatskin tumor  Subjective: Sitting in chair, taking afternoon medications.  Sample sent to lab per RN.    Allergies: Merthiolate [thimerosal] and Forteo [parathyroid hormone (recomb)]  Medications: Prior to Admission medications   Medication Sig Start Date End Date Taking? Authorizing Provider  amoxicillin (AMOXIL) 500 MG capsule Take 2,000 mg by mouth See admin instructions. Take 4 capsules (2000 mg) by mouth one hour prior to dental appointment 07/31/15  Yes [provider]  aspirin EC 81 MG tablet Take 81 mg by mouth daily.   Yes [provider]  Calcium Carbonate-Vitamin D (CALTRATE 600+D) 600-400 MG-UNIT per tablet Take 1 tablet by mouth daily.   Yes [provider]  carbidopa-levodopa (SINEMET IR) 25-100 MG tablet TAKE 1 TABLET BY MOUTH THREE TIMES DAILY, TAKE 4 HOURS APART Patient taking differently: Take 1 tablet by mouth 3 (three) times daily. 0800, 1200, 1600 05/14/19  Yes Lomax, Amy, NP  furosemide (LASIX) 20 MG tablet Take 20 mg by mouth daily.    Yes [provider]  guaiFENesin (MUCINEX) 600 MG 12 hr tablet Take 600 mg by mouth 2 (two) times daily as needed (congestion).   Yes [provider]  metoprolol succinate (TOPROL-XL) 100 MG 24 hr tablet Take 100 mg by mouth daily. Take with or immediately following a meal.   Yes [provider]  oxycodone (OXY-IR) 5 MG capsule Take 1 capsule (5 mg total) by mouth every 8 (eight) hours as needed. Patient taking differently: Take 5 mg by mouth every 8 (eight) hours as needed for pain.  11/06/19  Yes Virgel Manifold, MD  Polyethyl Glycol-Propyl Glycol (SYSTANE OP) Place 1 drop into both eyes daily as needed (dry eyes).   Yes [provider]  vitamin C (ASCORBIC ACID) 250 MG tablet Take 500 mg by mouth daily.   Yes  [provider]  Vitamin D, Ergocalciferol, (DRISDOL) 50000 units CAPS capsule Take 50,000 Units by mouth 2 (two) times a week. Tuesdays and Fridays   Yes [provider]     Vital Signs: BP (!) 143/67 (BP Location: Right Arm)   Pulse (!) 57   Temp 97.7 F (36.5 C) (Oral)   Resp 18   Ht 5\' 5"  (1.651 m)   Wt 157 lb 13.6 oz (71.6 kg)   SpO2 98%   BMI 26.27 kg/m   Physical Exam  NAD, alert Skin: jaundice continues to improve Abdomen: Right and left biliary drains in place without issue.  Insertion sites intact. No tenderness. Biloma drain intact.  Insertion site c/d/i. Similar appearing output in each drain- dark, bilious.    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 New right-sided external biliary drain catheter placed 11/08/2019. Biloma drain catheter placed lateral to the liver. EXAM: EXCHANGE OF EXTERNAL BILIARY DRAIN CATHETER UNDER FLUOROSCOPY MEDICATIONS: Patient was receiving adequate antibiotic coverage  as an inpatient ANESTHESIA/SEDATION: Intravenous Fentanyl 13mcg and Versed 1mg  were administered as conscious sedation during continuous monitoring of the patient's level of consciousness and physiological / cardiorespiratory status by the radiology RN, with a total moderate sedation time of 46 minutes. PROCEDURE: Informed written consent was obtained from the patient and  family after a thorough discussion of the procedural risks, benefits and alternatives. All questions were addressed. Maximal Sterile Barrier Technique was utilized including caps, mask, sterile gowns, sterile gloves, sterile drape, hand hygiene and skin antiseptic. A timeout was performed prior to the initiation of the procedure. The skin entry site of the right external biliary drain was infiltrated with 1% lidocaine. Retention suture was cut. Cholangiogram showed good position in the right biliary system, which was decompressed. No extravasation. The catheter was cut and exchanged over a short Amplatz wire for a 9 French short vascular sheath, through which a 5 [place] Kumpe catheter was advanced to the biliary confluence. Multiple attempts were made to traverse the obstruction using the cholangiogram and the endoscopic metallic stent as guidance in multiple projections with a variety of catheter guidewire techniques. Several false passages were created, but access to the CBD could not ultimately be achieved. Ultimately, the Kumpe catheter and sheath were exchanged back for a 12 French Dawson Mueller catheter, formed centrally within the right biliary tree access external biliary drain. The patient tolerated the procedure well. FLUOROSCOPY TIME:  21.4 minutes; [id number] uGym2 DAP COMPLICATIONS: None immediate. IMPRESSION: 1. The obstruction of the right biliary tree at the biliary confluence could not be traversed antegrade. 2. 12 Leonides Schanz catheter placed as right external biliary drain. Electronically Signed   By: Lucrezia Europe M.D.   On: 11/12/2019 17:39    Labs:  CBC: Recent Labs    11/11/19 0310 11/12/19 0329 11/13/19 0305 11/14/19 0143  WBC 7.2 8.1 6.2 6.0  HGB 10.1* 10.3* 10.0* 10.0*  HCT 31.0* 31.7* 31.4* 31.8*  PLT 350 397 297 319    COAGS: Recent Labs    10/18/19 1020 10/24/19 0833 11/07/19 1433 11/12/19 0329  INR 1.0 1.0 1.2 1.2  APTT  --   --  38*  --     BMP: Recent  Labs    11/11/19 0310 11/12/19 0329 11/13/19 0305 11/14/19 0143  NA 140 140 143 141  K 3.5 3.6 3.8 3.7  CL 112* 113* 116* 114*  CO2 19* 18* 22 21*  GLUCOSE 113* 100* 86 100*  BUN 10 7* 8 8  CALCIUM 8.2* 8.2* 8.2* 8.2*  CREATININE 0.96 0.91 0.90 0.84  GFRNONAA 57* >60 >60 >60  GFRAA >60 >60 >60 >60    LIVER FUNCTION TESTS: Recent Labs    11/11/19 0310 11/12/19 0329 11/13/19 0305 11/14/19 0143  BILITOT 7.0* 6.2* 5.1* 4.3*  AST 47* 50* 53* 43*  ALT 7 10 16 9   ALKPHOS 161* 163* 165* 149*  PROT 4.9* 5.0* 4.6* 4.5*  ALBUMIN 1.8* 1.9* 1.8* 1.7*    Assessment and Plan: Biliary obstruction, s/p right external biliary drain placement in IR 10/18/2019 by Dr. Vernard Gambles; s/p left external biliary drain placement and right external biliary drain exchange in IR 10/24/2019 by Dr. Annamaria Boots; right external biliary drain was inadvertently removed s/p right external biliary drain placement, left external biliary drain exchange, and right lateral abdomen biloma drain placement in IR 11/09/2019 by Dr. Earleen Newport; s/p attempted/unsuccessful transverse of central obstruction in IR 11/12/2019 by Dr. Vernard Gambles.  Patient stable with 3 drains in place currently- right and left biliary  drains, 1 biloma drain.  All with significant output: -R biliary drain 350 mL overnight -L biliary drain 120 mL overnight -RUQ biloma drain 110 mL Plan is for d/c to SNF soon.  Outpatient follow-up in place for drain evaluation and exchange in 8 weeks.  Continue to flush drains daily and record output.    Electronically Signed: Docia Barrier, PA 11/15/2019, 4:06 PM   I spent a total of 15 minutes at the the patient's bedside AND on the patient's hospital floor or unit, greater than 50% of which was counseling/coordinating care for biliary obstruction.

## 2019-11-15 NOTE — Evaluation (Signed)
Occupational Therapy Evaluation Patient Details Name: Hailey [name] MRN: [id number] DOB: 1941-01-03 Today's Date: 11/15/2019    History of Present Illness Pt is a 79 y.o. F with significant PMH of HTN, Parkinson disease, arthritis who presents with biliary obstruction. Pt with history of Klatskin tumor causing biliary obstruction, s/p right external biliary drain placement in IR 10/18/2019 by Dr. Vernard Gambles; s/p left external biliary drain placement and right external biliary drain exchange in IR 10/24/2019 by Dr. Annamaria Boots; right external biliary drain was inadvertently removed s/p right external biliary drain placement, left external biliary drain exchang, and right lateral abdomen biloma drain placement in IR 11/09/2019 by Dr. Earleen Newport; s/p attempted/unsuccessful transverse of central obstruction in IR 11/12/2019 by Dr. Vernard Gambles.    Clinical Impression   Pt admitted with the above diagnoses and presents with below problem list. Pt will benefit from continued acute OT to address the below listed deficits and maximize independence with basic ADLs prior to d/c to venue below. At baseline, pt is mod I with ADLs but over the past month has experienced a decline in health and increase in assist level with ADLs. Pt currently mod-max +2 assist with LB ADLs, functional transfers, and short distance ambulation. Pt mod A with UB ADLs. Able to feed self with setup and extra time.     Follow Up Recommendations  SNF    Equipment Recommendations  Other (comment)(defer to next venue)    Recommendations for Other Services       Precautions / Restrictions Precautions Precautions: Fall;Other (comment) Precaution Comments: x3 biliary drains Restrictions Weight Bearing Restrictions: No      Mobility Bed Mobility Overal bed mobility: Needs Assistance Bed Mobility: Supine to Sit     Supine to sit: Mod assist;+2 for physical assistance     General bed mobility comments: Pt able to progress BLE's off edge of bed  with increased time/effort. ModA + 2 to boost trunk up to sitting position and use bed pad to scoot hips forward to edge of bed   Transfers Overall transfer level: Needs assistance Equipment used: Rolling walker (2 wheeled) Transfers: Sit to/from Stand Sit to Stand: Mod assist;+2 physical assistance         General transfer comment: ModA + 2 to rise from elevated surface. Decreased eccentric control    Balance Overall balance assessment: Needs assistance Sitting-balance support: Feet supported Sitting balance-Leahy Scale: Fair     Standing balance support: Bilateral upper extremity supported Standing balance-Leahy Scale: Poor Standing balance comment: rw and steadying assist in static standing                           ADL either performed or assessed with clinical judgement   ADL Overall ADL's : Needs assistance/impaired Eating/Feeding: Set up;Sitting;Modified independent Eating/Feeding Details (indicate cue type and reason): extra time and effort but no physical assist required Grooming: Set up;Minimal assistance;Sitting   Upper Body Bathing: Sitting;Moderate assistance   Lower Body Bathing: Maximal assistance;+2 for physical assistance;Sit to/from stand   Upper Body Dressing : Moderate assistance;Sitting   Lower Body Dressing: Maximal assistance;+2 for physical assistance;Sit to/from stand   Toilet Transfer: Moderate assistance;+2 for physical assistance;Stand-pivot;RW;BSC   Toileting- Clothing Manipulation and Hygiene: Maximal assistance;+2 for physical assistance;Sit to/from stand       Functional mobility during ADLs: Moderate assistance;+2 for physical assistance;Rolling walker;Cueing for sequencing General ADL Comments: Extra time and effort. verbal cueing to faciliate sequencing of movements.      Vision  Baseline Vision/History: Wears glasses Wears Glasses: At all times       Perception     Praxis      Pertinent Vitals/Pain Pain  Assessment: No/denies pain     Hand Dominance Right   Extremity/Trunk Assessment Upper Extremity Assessment Upper Extremity Assessment: RUE deficits/detail;LUE deficits/detail RUE Deficits / Details: Shoulder flexion AROM WFL LUE Deficits / Details: Shoulder flexion to ~10 degrees (limited by pain), elbow/wrist flexion WFL. "I was due to have surgery on that shoulder but then all this happened."   Lower Extremity Assessment Lower Extremity Assessment: Defer to PT evaluation;Generalized weakness   Cervical / Trunk Assessment Cervical / Trunk Assessment: Kyphotic   Communication Communication Communication: No difficulties   Cognition Arousal/Alertness: Awake/alert Behavior During Therapy: WFL for tasks assessed/performed Overall Cognitive Status: Impaired/Different from baseline Area of Impairment: Problem solving;Safety/judgement;Awareness;Memory                     Memory: Decreased short-term memory   Safety/Judgement: Decreased awareness of deficits Awareness: Emergent Problem Solving: Slow processing;Difficulty sequencing;Requires verbal cues;Requires tactile cues;Decreased initiation General Comments: Pt A&Ox4 today. Requires cues for problem solving, initiation, sequencing. STM deficits noted   General Comments       Exercises     Shoulder Instructions      Home Living Family/patient expects to be discharged to:: Private residence Living Arrangements: Alone Available Help at Discharge: Family;Available PRN/intermittently Type of Home: House Home Access: Stairs to enter [employer] of Steps: 1 Entrance Stairs-Rails: None Home Layout: One level     Bathroom Shower/Tub: Teacher, early years/pre: Standard     Home Equipment: Environmental consultant - 4 wheels;Shower seat          Prior Functioning/Environment Level of Independence: Needs assistance  Gait / Transfers Assistance Needed: Pt has been requiring increased level of assist past 3  weeks - at times needs assist to stand, significant amount of time required to walk ADL's / Homemaking Assistance Needed: Has been needing increased level of assist the past 3 weeks - pt sister reports she takes "45 minutes to dress."            OT Problem List: Decreased strength;Decreased activity tolerance;Impaired balance (sitting and/or standing);Decreased cognition;Decreased knowledge of use of DME or AE;Decreased knowledge of precautions;Impaired UE functional use;Decreased range of motion;Decreased coordination      OT Treatment/Interventions: Self-care/ADL training;Therapeutic exercise;DME and/or AE instruction;Neuromuscular education;Therapeutic activities;Cognitive remediation/compensation;Patient/family education;Balance training    OT Goals(Current goals can be found in the care plan section) Acute Rehab OT Goals Patient Stated Goal: not fall OT Goal Formulation: With patient Time For Goal Achievement: 11/29/19 Potential to Achieve Goals: Good ADL Goals Pt Will Perform Grooming: with min guard assist;sitting Pt Will Perform Upper Body Bathing: sitting;with min assist Pt Will Perform Lower Body Bathing: with mod assist;sit to/from stand Pt Will Transfer to Toilet: with mod assist;ambulating Pt Will Perform Toileting - Clothing Manipulation and hygiene: with mod assist;sit to/from stand;sitting/lateral leans Additional ADL Goal #1: Pt will complete bed mobility at min A level to prepare for OOB/EOB ADLs.  OT Frequency: Min 2X/week   Barriers to D/C:            Co-evaluation PT/OT/SLP Co-Evaluation/Treatment: Yes Reason for Co-Treatment: Complexity of the patient's impairments (multi-system involvement);For patient/therapist safety;To address functional/ADL transfers   OT goals addressed during session: ADL's and self-care      AM-PAC OT "6 Clicks" Daily Activity     Outcome Measure Help from another person  eating meals?: A Little Help from another person taking  care of personal grooming?: A Little Help from another person toileting, which includes using toliet, bedpan, or urinal?: Total Help from another person bathing (including washing, rinsing, drying)?: Total Help from another person to put on and taking off regular upper body clothing?: A Lot Help from another person to put on and taking off regular lower body clothing?: Total 6 Click Score: 11   End of Session Equipment Utilized During Treatment: Gait belt;Rolling walker  Activity Tolerance: Patient tolerated treatment well Patient left: in chair;with call bell/phone within reach;with chair alarm set  OT Visit Diagnosis: Unsteadiness on feet (R26.81);Muscle weakness (generalized) (M62.81);Other symptoms and signs involving the nervous system (R29.898);Other symptoms and signs involving cognitive function                Time: [id number] OT Time Calculation (min): 24 min Charges:  OT General Charges $OT Visit: 1 Visit OT Evaluation $OT Eval Moderate Complexity: [place], OT Acute Rehabilitation Services Pager: [phone] Office: [phone]   Hortencia Pilar 11/15/2019, 10:34 AM

## 2019-11-15 NOTE — Progress Notes (Signed)
Physical Therapy Treatment Patient Details Name: Hailey [name] MRN: [id number] DOB: Jan 28, 1941 Today's Date: 11/15/2019    History of Present Illness Pt is a 79 y.o. F with significant PMH of HTN, Parkinson disease, arthritis who presents with biliary obstruction. Pt with history of Klatskin tumor causing biliary obstruction, s/p right external biliary drain placement in IR 10/18/2019 by Dr. Vernard Gambles; s/p left external biliary drain placement and right external biliary drain exchange in IR 10/24/2019 by Dr. Annamaria Boots; right external biliary drain was inadvertently removed s/p right external biliary drain placement, left external biliary drain exchang, and right lateral abdomen biloma drain placement in IR 11/09/2019 by Dr. Earleen Newport; s/p attempted/unsuccessful transverse of central obstruction in IR 11/12/2019 by Dr. Vernard Gambles.     PT Comments    Pt maintaining level of mobility. Requiring two person moderate assist for functional mobility. Ambulating 5 feet with a walker and close chair follow. HR 62-72 bpm. Continues with generalized weakness, decreased endurance, slow processing/initiation, and poor balance with retropulsion. sneezing    Follow Up Recommendations  SNF;Supervision/Assistance - 24 hour     Equipment Recommendations  Other (comment)(defer)    Recommendations for Other Services       Precautions / Restrictions Precautions Precautions: Fall;Other (comment) Precaution Comments: x3 biliary drains Restrictions Weight Bearing Restrictions: No    Mobility  Bed Mobility Overal bed mobility: Needs Assistance Bed Mobility: Supine to Sit     Supine to sit: Mod assist;+2 for physical assistance     General bed mobility comments: Pt able to progress BLE's off edge of bed with increased time/effort. ModA + 2 to boost trunk up to sitting position and use bed pad to scoot hips forward to edge of bed   Transfers Overall transfer level: Needs assistance Equipment used: Rolling walker (2  wheeled) Transfers: Sit to/from Stand Sit to Stand: Mod assist;+2 physical assistance         General transfer comment: ModA + 2 to rise from elevated surface. Decreased eccentric control  Ambulation/Gait Ambulation/Gait assistance: +2 physical assistance;Mod assist Gait Distance (Feet): 5 Feet Assistive device: Rolling walker (2 wheeled) Gait Pattern/deviations: Step-to pattern;Decreased stride length;Shuffle;Trunk flexed;Narrow base of support Gait velocity: decreased Gait velocity interpretation: <1.31 ft/sec, indicative of household ambulator General Gait Details: Max cues for sequencing, stepping initiation, manual assist provided for negotiation of walker. Close chair follow utilized. Slow pace   Stairs             Wheelchair Mobility    Modified Rankin (Stroke Patients Only)       Balance Overall balance assessment: Needs assistance Sitting-balance support: Feet supported Sitting balance-Leahy Scale: Fair     Standing balance support: Bilateral upper extremity supported Standing balance-Leahy Scale: Poor Standing balance comment: reliant on external support                            Cognition Arousal/Alertness: Awake/alert Behavior During Therapy: WFL for tasks assessed/performed Overall Cognitive Status: Impaired/Different from baseline Area of Impairment: Problem solving;Safety/judgement;Awareness;Memory                     Memory: Decreased short-term memory   Safety/Judgement: Decreased awareness of deficits Awareness: Emergent Problem Solving: Slow processing;Difficulty sequencing;Requires verbal cues;Requires tactile cues;Decreased initiation General Comments: Pt A&Ox4 today. Requires cues for problem solving, initiation, sequencing. STM deficits noted      Exercises      General Comments        Pertinent Vitals/Pain Pain  Assessment: No/denies pain    Home Living Family/patient expects to be discharged to::  Private residence Living Arrangements: Alone Available Help at Discharge: Family;Available PRN/intermittently Type of Home: House Home Access: Stairs to enter            Prior Function            PT Goals (current goals can now be found in the care plan section) Acute Rehab PT Goals Patient Stated Goal: not fall PT Goal Formulation: With patient Time For Goal Achievement: 11/28/19 Potential to Achieve Goals: Fair    Frequency    Min 2X/week      PT Plan Current plan remains appropriate    Co-evaluation              AM-PAC PT "6 Clicks" Mobility   Outcome Measure  Help needed turning from your back to your side while in a flat bed without using bedrails?: A Little Help needed moving from lying on your back to sitting on the side of a flat bed without using bedrails?: A Lot Help needed moving to and from a bed to a chair (including a wheelchair)?: A Lot Help needed standing up from a chair using your arms (e.g., wheelchair or bedside chair)?: A Lot Help needed to walk in hospital room?: A Lot Help needed climbing 3-5 steps with a railing? : Total 6 Click Score: 12    End of Session Equipment Utilized During Treatment: Gait belt Activity Tolerance: Patient tolerated treatment well Patient left: in chair;with call bell/phone within reach;with chair alarm set Nurse Communication: Mobility status PT Visit Diagnosis: Unsteadiness on feet (R26.81);Muscle weakness (generalized) (M62.81);Difficulty in walking, not elsewhere classified (R26.2)     Time: [id number] PT Time Calculation (min) (ACUTE ONLY): 22 min  Charges:  $Gait Training: 8-22 mins                       Wyona Almas, PT, DPT Acute Rehabilitation Services Pager [phone] Office [phone]    Deno Etienne 11/15/2019, 10:07 AM

## 2019-11-15 NOTE — Progress Notes (Signed)
PROGRESS NOTE    Hailey [name]  [id number] DOB: 07/16/1941 DOA: 11/07/2019 PCP: Prince Solian, MD    Brief Narrative:  Patient was admitted to the hospital with a working diagnosis of biliary obstruction.  79 year old female who has past medical history for hypertension, arthritis, Parkinson's disease, and chronic lower extremity edema.  Patient is known to have a biliary obstruction, which has been worked up as an outpatient, she had multiple attempts to relieve obstruction, per IR and ERCP.  She had a left sided external biliary drain placed, and was used to perform cholangiogram. Patient underwent a new left intrahepatic drain placement.  Postprocedure she was admitted to the hospital due to risk of infection.   Patient was placed on broad-spectrum antibiotic therapy and intravenous fluids. On April 20 she had an unsuccessful attempt to bypass the biliary obstruction.   Patient will be referred for tertiary care for further interventions.  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   Assessment & Plan:   Principal Problem:   Biliary obstruction Active Problems:   Parkinson's disease ([place])   Hypertension   AKI (acute kidney injury) ([place])   Hyponatremia   Leukocytosis   Normocytic anemia   Bilateral leg edema   Increased ammonia level    1. 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 Right lateral abdomen biloma drain. Sp unsuccessful transverse obstruction intervention. Patient with no tissue diagnosis, but very high pretest probability for  cholangiocarcinoma.   Antibiotic therapy with IV Zosyn and tolerating well soft diet. Patient will need SNF post discharge.  Will need outpatient follow up for biliary obstruction with tertiary care. Continue drain care for now. Waiting for tissue diagnosis, before consulting oncology. Check liver panel in am. Continue pain control  with as needed hydrocodone.   2. AKI, hyponatremia.  Will follow up electrolytes in am, patient is tolerating well soft diet. Avoid hypotension and nephrotoxic medications.   3. Metabolic encephalopathy. Patient is very weak and deconditioned, will need SNF at discharge. Able to follow commands and answer simple questions. Follow with nutrition recommendations.  4. Parkinson's disease. On Sinemet. Limited mobility.   5. HTN. On metoprolol for blood pressure control.   6. Multifactorial anemia. Will follow cell count in am. .   DVT prophylaxis:      Enoxaparin  Code Status:              full  Family Communication:        I spoke over the phone with the patient's family about patient's  condition, plan of care, prognosis and all questions were addressed. Disposition Plan:              Patient is from:                         home              Anticipated DC to:                   Home              Anticipated DC date:               04.23              Anticipated DC barriers:         Continue need of antibiotic therapy and need to monitor  response.       Consultants:   GI   IR   Procedures:   Biliary drain replaced  Antimicrobials:   Zosyn     Subjective: Patient is out of bed with improvement in po intake, no nausea or vomiting, continue to be very weak and deconditioned,   Objective: Vitals:   11/14/19 1425 11/14/19 2125 11/15/19 0541 11/15/19 0930  BP: (!) 146/65 139/63 (!) 151/63 (!) 160/70  Pulse: (!) 55 (!) 53 (!) 53 64  Resp: 18 15 14    Temp: 97.7 F (36.5 C) (!) 97.3 F (36.3 C) 98 F (36.7 C)   TempSrc: Oral Oral Oral   SpO2: 100% 100% 100%   Weight:      Height:        Intake/Output Summary (Last 24 hours) at 11/15/2019 1315 Last data filed at 11/15/2019 0916 Gross per 24 hour  Intake 1321.88 ml  Output 845 ml  Net 476.88 ml   Filed Weights   11/07/19 0802 11/10/19 0500 11/11/19 0438  Weight: 66.7 kg 68.8 kg 71.6 kg    Examination:     General: Not in pain or dyspnea, deconditioned  Neurology: Awake and alert, non focal  E ENT: positive pallor, positive icterus, oral mucosa moist Cardiovascular: No JVD. S1-S2 present, rhythmic, no gallops, rubs, or murmurs. No lower extremity edema. Pulmonary: positive breath sounds bilaterally, adequate air movement, no wheezing, rhonchi or rales. Gastrointestinal. Abdomen with no organomegaly, non tender, no rebound or guarding Skin. No rashes Musculoskeletal: no joint deformities     Data Reviewed: I have personally reviewed following labs and imaging studies  CBC: Recent Labs  Lab 11/08/19 1453 11/11/19 0310 11/12/19 0329 11/13/19 0305 11/14/19 0143  WBC 9.2 7.2 8.1 6.2 6.0  NEUTROABS 8.1*  --   --   --   --   HGB 10.5* 10.1* 10.3* 10.0* 10.0*  HCT 32.0* 31.0* 31.7* 31.4* 31.8*  MCV 98.8 97.2 97.2 99.4 99.4  PLT 324 350 397 297 [id number]   Basic Metabolic Panel: Recent Labs  Lab 11/10/19 0409 11/11/19 0310 11/12/19 0329 11/13/19 0305 11/14/19 0143  NA 138 140 140 143 141  K 4.0 3.5 3.6 3.8 3.7  CL 110 112* 113* 116* 114*  CO2 20* 19* 18* 22 21*  GLUCOSE 107* 113* 100* 86 100*  BUN 15 10 7* 8 8  CREATININE 1.06* 0.96 0.91 0.90 0.84  CALCIUM 8.3* 8.2* 8.2* 8.2* 8.2*  MG 2.1 2.0 1.9 1.9 1.8  PHOS 2.5 2.1* 2.1* 2.6 2.4*   GFR: Estimated Creatinine Clearance: 54.7 mL/min (by C-G formula based on SCr of 0.84 mg/dL). Liver Function Tests: Recent Labs  Lab 11/10/19 0409 11/11/19 0310 11/12/19 0329 11/13/19 0305 11/14/19 0143  AST 44* 47* 50* 53* 43*  ALT 9 7 10 16 9   ALKPHOS 132* 161* 163* 165* 149*  BILITOT 7.6* 7.0* 6.2* 5.1* 4.3*  PROT 5.1* 4.9* 5.0* 4.6* 4.5*  ALBUMIN 1.8* 1.8* 1.9* 1.8* 1.7*   No results for input(s): LIPASE, AMYLASE in the last 168 hours. Recent Labs  Lab 11/11/19 1243 11/13/19 0305 11/14/19 0143  AMMONIA 51* 51* 42*   Coagulation Profile: Recent Labs  Lab 11/12/19 0329  INR 1.2   Cardiac Enzymes: No results for  input(s): CKTOTAL, CKMB, CKMBINDEX, TROPONINI in the last 168 hours. BNP (last 3 results) No results for input(s): PROBNP in the last 8760 hours. HbA1C: No results for input(s): HGBA1C in the last 72 hours. CBG: No results for input(s): GLUCAP in the  last 168 hours. Lipid Profile: No results for input(s): CHOL, HDL, LDLCALC, TRIG, CHOLHDL, LDLDIRECT in the last 72 hours. Thyroid Function Tests: No results for input(s): TSH, T4TOTAL, FREET4, T3FREE, THYROIDAB in the last 72 hours. Anemia Panel: No results for input(s): VITAMINB12, FOLATE, FERRITIN, TIBC, IRON, RETICCTPCT in the last 72 hours.    Radiology Studies: I have reviewed all of the imaging during this hospital visit personally     Scheduled Meds: . aspirin EC  81 mg Oral Daily  . carbidopa-levodopa  1 tablet Oral TID  . lactulose  30 g Oral BID  . magnesium oxide  400 mg Oral TID  . metoprolol succinate  100 mg Oral Daily  . sodium chloride flush  5 mL Intracatheter Q8H  . sodium chloride flush  5 mL Intracatheter Q8H   Continuous Infusions: . piperacillin-tazobactam (ZOSYN)  IV 3.375 g (11/15/19 0554)     LOS: 8 days        Taffy Delconte Gerome Apley, MD

## 2019-11-16 DIAGNOSIS — N179 Acute kidney failure, unspecified: Secondary | ICD-10-CM | POA: Diagnosis not present

## 2019-11-16 DIAGNOSIS — I10 Essential (primary) hypertension: Secondary | ICD-10-CM | POA: Diagnosis not present

## 2019-11-16 DIAGNOSIS — R600 Localized edema: Secondary | ICD-10-CM | POA: Diagnosis not present

## 2019-11-16 DIAGNOSIS — K831 Obstruction of bile duct: Secondary | ICD-10-CM | POA: Diagnosis not present

## 2019-11-16 LAB — COMPREHENSIVE METABOLIC PANEL
ALT: 29 U/L (ref 0–44)
AST: 37 U/L (ref 15–41)
Albumin: 1.8 g/dL — ABNORMAL LOW (ref 3.5–5.0)
Alkaline Phosphatase: 143 U/L — ABNORMAL HIGH (ref 38–126)
Anion gap: 10 (ref 5–15)
BUN: 7 mg/dL — ABNORMAL LOW (ref 8–23)
CO2: 21 mmol/L — ABNORMAL LOW (ref 22–32)
Calcium: 8.2 mg/dL — ABNORMAL LOW (ref 8.9–10.3)
Chloride: 109 mmol/L (ref 98–111)
Creatinine, Ser: 0.85 mg/dL (ref 0.44–1.00)
GFR calc Af Amer: >60 mL/min (ref >60)
GFR calc non Af Amer: >60 mL/min (ref >60)
Glucose, Bld: 103 mg/dL — ABNORMAL HIGH (ref 70–99)
Potassium: 3 mmol/L — ABNORMAL LOW (ref 3.5–5.1)
Sodium: 140 mmol/L (ref 135–145)
Total Bilirubin: 3.9 mg/dL — ABNORMAL HIGH (ref 0.3–1.2)
Total Protein: 4.6 g/dL — ABNORMAL LOW (ref 6.5–8.1)

## 2019-11-16 LAB — CBC WITH DIFFERENTIAL/PLATELET
Abs Immature Granulocytes: 0.06 10*3/uL (ref 0.00–0.07)
Basophils Absolute: 0.1 10*3/uL (ref 0.0–0.1)
Basophils Relative: 1 %
Eosinophils Absolute: 0.2 10*3/uL (ref 0.0–0.5)
Eosinophils Relative: 3 %
HCT: 33.9 % — ABNORMAL LOW (ref 36.0–46.0)
Hemoglobin: 10.8 g/dL — ABNORMAL LOW (ref 12.0–15.0)
Immature Granulocytes: 1 %
Lymphocytes Relative: 11 %
Lymphs Abs: 0.6 10*3/uL — ABNORMAL LOW (ref 0.7–4.0)
MCH: 31.8 pg (ref 26.0–34.0)
MCHC: 31.9 g/dL (ref 30.0–36.0)
MCV: 99.7 fL (ref 80.0–100.0)
Monocytes Absolute: 0.5 10*3/uL (ref 0.1–1.0)
Monocytes Relative: 8 %
Neutro Abs: 4.4 10*3/uL (ref 1.7–7.7)
Neutrophils Relative %: 76 %
Platelets: 394 10*3/uL (ref 150–400)
RBC: 3.4 MIL/uL — ABNORMAL LOW (ref 3.87–5.11)
RDW: 15.8 % — ABNORMAL HIGH (ref 11.5–15.5)
WBC: 5.7 10*3/uL (ref 4.0–10.5)
nRBC: 0 % (ref 0.0–0.2)

## 2019-11-16 LAB — MAGNESIUM: Magnesium: 1.9 mg/dL (ref 1.7–2.4)

## 2019-11-16 MED ORDER — AMOXICILLIN-POT CLAVULANATE 875-125 MG PO TABS
1.0000 | ORAL_TABLET | Freq: Two times a day (BID) | ORAL | Status: AC
  Administered 2019-11-16 – 2019-11-17 (×4): 1 via ORAL
  Filled 2019-11-16 (×4): qty 1

## 2019-11-16 MED ORDER — POTASSIUM CHLORIDE CRYS ER 20 MEQ PO TBCR
40.0000 meq | EXTENDED_RELEASE_TABLET | ORAL | Status: AC
  Administered 2019-11-16 (×2): 40 meq via ORAL
  Filled 2019-11-16 (×2): qty 2

## 2019-11-16 MED ORDER — AMOXICILLIN-POT CLAVULANATE 875-125 MG PO TABS: 1.0000 | ORAL_TABLET | Freq: Two times a day (BID) | ORAL | Status: DC

## 2019-11-16 NOTE — Progress Notes (Signed)
PROGRESS NOTE    Hailey [name]  [id number] DOB: [birth year] DOA: 11/07/2019 PCP: Prince Solian, MD    Brief Narrative:  Patient was admitted to the hospital with a working diagnosis of biliary obstruction.  79 year old female who has past medical history for hypertension, arthritis, Parkinson's disease, and chronic lower extremity edema. Patient is knownto have a biliary obstruction,which has been worked up as an outpatient,she hadmultiple attempts to relieve obstruction, perIR and ERCP. She had a left sided external biliary drain placed,and was used to perform cholangiogram. Patient underwent a new left intrahepatic drain placement.Postprocedure she was admitted to the hospital due to risk of infection.  Patient was placed on broad-spectrum antibiotic therapy and intravenous fluids. OnApril 20 she had anunsuccessfulattempt tobypassthe biliaryobstruction.  Patient will be referred for tertiary care for further interventions. 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  Patient very weak and deconditioned, plan to transfer to SNF, with drains in place and follow with tertiary care for further invasive and diagnostic intervention.   Will need outpatient follow up for biliary obstruction with tertiary care. Continue drain care for now. Waiting for tissue diagnosis, before consulting oncology  Assessment & Plan:   Principal Problem:   Biliary obstruction Active Problems:   Parkinson's disease ([place])   Hypertension   AKI (acute kidney injury) ([place])   Hyponatremia   Leukocytosis   Normocytic anemia   Bilateral leg edema   Increased ammonia level    1. 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 Right lateral abdomen biloma drain. Sp unsuccessful transverse obstruction intervention. Patient with no tissue diagnosis, but very high pretest probability for   cholangiocarcinoma. AST 37, ALT 29, T bil down to 3,9 from 4.3.   Will continue antibiotic therapy with Augmentin for 2 more days. She continue to be very weak and deconditioned will need SNF post discharge.   2. AKI, hyponatremia. hypokalemia. Stable renal function with serum cr at 0,85 with K at 3,0, Mg 1.9 and serum bicarbonate at 21. Will correct K with Kcl 80 meq in 2 dived doses. Patient tolerating po soft diet. Continue po mag oxide.   3. Metabolic encephalopathy. Patient continue to be very weak and deconditioned. Continue nutritional support. Will dc lactulose for now.   4. Parkinson's disease. continue with Sinemet. Limited mobility.   5. HTN. continue with metoprolol for blood pressure control.   6. Multifactorial anemia. Hgb 10.8 with Hct at 33,9.   DVT prophylaxis:Enoxaparin Code Status:full Family Communication:I spoke over the phone with the patient's family about patient's  condition, plan of care, prognosis and all questions were addressed. Disposition Plan: Patient is from:home Anticipated DC [id number] Anticipated DC date:04.23 Anticipated DC barriers:Continue need of antibiotic therapy and need to monitor response.    Consultants:  GI   IR  Procedures:  Biliary drain replaced  Antimicrobials:  Zosyn    Subjective: Patient is tolerating po well, only mild nausea but no vomiting, no chest pain or dyspnea.   Objective: Vitals:   11/15/19 0930 11/15/19 1340 11/15/19 2048 11/16/19 0501  BP: (!) 160/70 (!) 143/67 (!) 138/54 (!) 150/61  Pulse: 64 (!) 57 (!) 57 (!) 58  Resp:  18 16 18   Temp:  97.7 F (36.5 C) 98.1 F (36.7 C) (!) 97.5 F (36.4 C)  TempSrc:  Oral Oral Oral  SpO2:  98% 100% 99%  Weight:      Height:  Intake/Output Summary (Last 24 hours) at 11/16/2019  1147 Last data filed at 11/16/2019 0900 Gross per 24 hour  Intake 475 ml  Output 429 ml  Net 46 ml   Filed Weights   11/07/19 0802 11/10/19 0500 11/11/19 0438  Weight: 66.7 kg 68.8 kg 71.6 kg    Examination:   General: Not in pain or dyspnea, deconditioned  Neurology: Awake and alert, non focal  E ENT: mild pallor, and icterus, oral mucosa moist Cardiovascular: No JVD. S1-S2 present, rhythmic, no gallops, rubs, or murmurs. No lower extremity edema. Pulmonary: vesicular breath sounds bilaterally, adequate air movement, no wheezing, rhonchi or rales. Gastrointestinal. Abdomen with, no organomegaly, non tender, no rebound or guarding. Drains in place Skin. No rashes Musculoskeletal: no joint deformities     Data Reviewed: I have personally reviewed following labs and imaging studies  CBC: Recent Labs  Lab 11/11/19 0310 11/12/19 0329 11/13/19 0305 11/14/19 0143 11/16/19 0820  WBC 7.2 8.1 6.2 6.0 5.7  NEUTROABS  --   --   --   --  4.4  HGB 10.1* 10.3* 10.0* 10.0* 10.8*  HCT 31.0* 31.7* 31.4* 31.8* 33.9*  MCV 97.2 97.2 99.4 99.4 99.7  PLT 350 397 297 319 [id number]   Basic Metabolic Panel: Recent Labs  Lab 11/10/19 0409 11/10/19 0409 11/11/19 0310 11/12/19 0329 11/13/19 0305 11/14/19 0143 11/16/19 0820  NA 138   < > 140 140 143 141 140  K 4.0   < > 3.5 3.6 3.8 3.7 3.0*  CL 110   < > 112* 113* 116* 114* 109  CO2 20*   < > 19* 18* 22 21* 21*  GLUCOSE 107*   < > 113* 100* 86 100* 103*  BUN 15   < > 10 7* 8 8 7*  CREATININE 1.06*   < > 0.96 0.91 0.90 0.84 0.85  CALCIUM 8.3*   < > 8.2* 8.2* 8.2* 8.2* 8.2*  MG 2.1   < > 2.0 1.9 1.9 1.8 1.9  PHOS 2.5  --  2.1* 2.1* 2.6 2.4*  --    < > = values in this interval not displayed.   GFR: Estimated Creatinine Clearance: 54.1 mL/min (by C-G formula based on SCr of 0.85 mg/dL). Liver Function Tests: Recent Labs  Lab 11/11/19 0310 11/12/19 0329 11/13/19 0305 11/14/19 0143 11/16/19 0820  AST 47* 50* 53* 43* 37  ALT 7 10 16  9 29   ALKPHOS 161* 163* 165* 149* 143*  BILITOT 7.0* 6.2* 5.1* 4.3* 3.9*  PROT 4.9* 5.0* 4.6* 4.5* 4.6*  ALBUMIN 1.8* 1.9* 1.8* 1.7* 1.8*   No results for input(s): LIPASE, AMYLASE in the last 168 hours. Recent Labs  Lab 11/11/19 1243 11/13/19 0305 11/14/19 0143  AMMONIA 51* 51* 42*   Coagulation Profile: Recent Labs  Lab 11/12/19 0329  INR 1.2   Cardiac Enzymes: No results for input(s): CKTOTAL, CKMB, CKMBINDEX, TROPONINI in the last 168 hours. BNP (last 3 results) No results for input(s): PROBNP in the last 8760 hours. HbA1C: No results for input(s): HGBA1C in the last 72 hours. CBG: No results for input(s): GLUCAP in the last 168 hours. Lipid Profile: No results for input(s): CHOL, HDL, LDLCALC, TRIG, CHOLHDL, LDLDIRECT in the last 72 hours. Thyroid Function Tests: No results for input(s): TSH, T4TOTAL, FREET4, T3FREE, THYROIDAB in the last 72 hours. Anemia Panel: No results for input(s): VITAMINB12, FOLATE, FERRITIN, TIBC, IRON, RETICCTPCT in the last 72 hours.    Radiology Studies: I have reviewed all of the imaging  during this hospital visit personally     Scheduled Meds: . aspirin EC  81 mg Oral Daily  . carbidopa-levodopa  1 tablet Oral TID  . feeding supplement (ENSURE ENLIVE)  237 mL Oral BID BM  . lactulose  30 g Oral BID  . magnesium oxide  400 mg Oral TID  . metoprolol succinate  100 mg Oral Daily  . multivitamin with minerals  1 tablet Oral Daily  . sodium chloride flush  5 mL Intracatheter Q8H  . sodium chloride flush  5 mL Intracatheter Q8H   Continuous Infusions: . piperacillin-tazobactam (ZOSYN)  IV 3.375 g (11/16/19 0612)     LOS: 9 days        Damarrion Mimbs Gerome Apley, MD

## 2019-11-16 NOTE — Progress Notes (Signed)
Pharmacy Antibiotic Note  Hailey [name] is a 79 y.o. female admitted on 11/07/2019 with complex intra-abdominal infection.  Pharmacy has been consulted for piperacillin/tazobactam dosing. Patient's renal function holding stable, afebrile, WBC WNL.  Patient with biliary obstruction s/p 4/15 ERCP, Ongoing attempts of internalization of biliary drains, On 4/20 IR was unsuccessful to bypass obstruction  Plan: Continue zosyn 3.375g IV every 8 hr Monitor cultures, clinical status, renal function Narrow abx if able and f/u duration   Height: 5\' 5"  (165.1 cm) Weight: 71.6 kg (157 lb 13.6 oz) IBW/kg (Calculated) : 57  Temp (24hrs), Avg:97.8 F (36.6 C), Min:97.5 F (36.4 C), Max:98.1 F (36.7 C)  Recent Labs  Lab 11/10/19 0409 11/11/19 0310 11/12/19 0329 11/13/19 0305 11/14/19 0143  WBC  --  7.2 8.1 6.2 6.0  CREATININE 1.06* 0.96 0.91 0.90 0.84    Estimated Creatinine Clearance: 54.7 mL/min (by C-G formula based on SCr of 0.84 mg/dL).     Antimicrobials this admission: Pip/tazo 4/16 >>  Cipro 4/14   Microbiology results: 4/15 BCx: ngtd  Thank you for the interesting consult and for involving pharmacy in this patient's care.  Tamela Gammon, PharmD, BCPS 11/16/2019 7:31 AM PGY-2 Pharmacy Administration Resident Please check [email or web address] for unit-specific pharmacist phone numbers

## 2019-11-17 DIAGNOSIS — R600 Localized edema: Secondary | ICD-10-CM | POA: Diagnosis not present

## 2019-11-17 DIAGNOSIS — I10 Essential (primary) hypertension: Secondary | ICD-10-CM | POA: Diagnosis not present

## 2019-11-17 DIAGNOSIS — K831 Obstruction of bile duct: Secondary | ICD-10-CM | POA: Diagnosis not present

## 2019-11-17 DIAGNOSIS — N179 Acute kidney failure, unspecified: Secondary | ICD-10-CM | POA: Diagnosis not present

## 2019-11-17 LAB — BASIC METABOLIC PANEL
Anion gap: 6 (ref 5–15)
BUN: 7 mg/dL — ABNORMAL LOW (ref 8–23)
CO2: 24 mmol/L (ref 22–32)
Calcium: 8.1 mg/dL — ABNORMAL LOW (ref 8.9–10.3)
Chloride: 110 mmol/L (ref 98–111)
Creatinine, Ser: 0.7 mg/dL (ref 0.44–1.00)
GFR calc Af Amer: >60 mL/min (ref >60)
GFR calc non Af Amer: >60 mL/min (ref >60)
Glucose, Bld: 91 mg/dL (ref 70–99)
Potassium: 3.8 mmol/L (ref 3.5–5.1)
Sodium: 140 mmol/L (ref 135–145)

## 2019-11-17 NOTE — Progress Notes (Signed)
PROGRESS NOTE    Sofya Littrel  [id number] DOB: [birth year] DOA: 11/07/2019 PCP: Prince Solian, MD    Brief Narrative:  Patient was admitted to the hospital with a working diagnosis of biliary obstruction.  79 year old female who has past medical history for hypertension, arthritis, Parkinson's disease, and chronic lower extremity edema. Patient is knownto have a biliary obstruction,which has been worked up as an outpatient,she hadmultiple attempts to relieve obstruction, perIR and ERCP. She had a left sided external biliary drain placed,and was used to perform cholangiogram. Patient underwent a new left intrahepatic drain placement.Postprocedure she was admitted to the hospital due to risk of infection.  Patient was placed on broad-spectrum antibiotic therapy and intravenous fluids. OnApril 20 she had anunsuccessfulattempt tobypassthe biliaryobstruction.   Patient will be referred for tertiary care for further interventions as outpatient. 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 Patient has been stable with current biliary drains, but very weak and deconditioned, plan to transfer to SNF for physical therapy.   Assessment & Plan:   Principal Problem:   Biliary obstruction Active Problems:   Parkinson's disease ([place])   Hypertension   AKI (acute kidney injury) ([place])   Hyponatremia   Leukocytosis   Normocytic anemia   Bilateral leg edema   Increased ammonia level   1. 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 Right lateral abdomen biloma drain. Sp unsuccessful transverse obstruction intervention. Patient with no tissue diagnosis, but very high pretest probability for  cholangiocarcinoma.   Change antibiotic therapy to Augmentin with good toleration, to complete therapy today. Continue to tolerate po well with no nausea or vomiting.   Plan for outpatient  follow up for biliary obstruction with tertiary care.   2. AKI, hyponatremia. Improved K at 3,8 with preserved renal function, patient is tolerating po well. Na at 140   3. Metabolic encephalopathy. Continue to be very weak and deconditioned, continue with nutritional supplements. Neuro checks per unit protocol.   4. Parkinson's disease. Continue wit Sinemet. She has very limited mobility.   5. HTN. Continue with metoprolol for blood pressure control.   6. Multifactorial anemia. cell count has remain stable with Hgb at 10,8 and Hct at 33.9  DVT prophylaxis:Enoxaparin Code Status:full Family Communication:I spoke over the phone with the patient's family about patient's  condition, plan of care, prognosis and all questions were addressed. Disposition Plan: Patient is from:home Anticipated DC [id number] Anticipated DC date:04.23 Anticipated DC barriers:Continue need of antibiotic therapy and need to monitor response.    Consultants:  GI   IR  Procedures:  Biliary drain replaced  Antimicrobials:  Zosyn 8 days  Augmentin 2 days     Subjective: Patient out of bed to chair, tolerating po well, with no nausea or vomiting, no chest pain or dyspnea, continue to be very weak and deconditioned,   Objective: Vitals:   11/16/19 1503 11/16/19 2100 11/17/19 0324 11/17/19 0827  BP: (!) 149/67 (!) 142/56 138/61 (!) 116/54  Pulse: (!) 54 60 (!) 59 68  Resp: 18 15 17    Temp: 97.6 F (36.4 C) 97.6 F (36.4 C) 97.8 F (36.6 C)   TempSrc:  Oral Oral   SpO2: 100% 98% 100%   Weight:      Height:        Intake/Output Summary (Last 24 hours) at 11/17/2019 1358 Last data filed at 11/17/2019 0900 Gross per 24 hour  Intake 245 ml  Output 945  ml  Net -700 ml   Filed Weights   11/07/19 0802 11/10/19 0500 11/11/19 0438    Weight: 66.7 kg 68.8 kg 71.6 kg    Examination:   General: Not in pain or dyspnea, deconditioned  Neurology: Awake and alert, non focal  E ENT: mild pallor, mild icterus, oral mucosa moist Cardiovascular: No JVD. S1-S2 present, rhythmic, no gallops, rubs, or murmurs. No lower extremity edema. Pulmonary: positive breath sounds bilaterally, adequate air movement, no wheezing, rhonchi or rales. Gastrointestinal. Abdomen with, no organomegaly, non tender, no rebound or guarding/ drains are in place.  Skin. No rashes Musculoskeletal: no joint deformities     Data Reviewed: I have personally reviewed following labs and imaging studies  CBC: Recent Labs  Lab 11/11/19 0310 11/12/19 0329 11/13/19 0305 11/14/19 0143 11/16/19 0820  WBC 7.2 8.1 6.2 6.0 5.7  NEUTROABS  --   --   --   --  4.4  HGB 10.1* 10.3* 10.0* 10.0* 10.8*  HCT 31.0* 31.7* 31.4* 31.8* 33.9*  MCV 97.2 97.2 99.4 99.4 99.7  PLT 350 397 297 319 [id number]   Basic Metabolic Panel: Recent Labs  Lab 11/11/19 0310 11/11/19 0310 11/12/19 0329 11/13/19 0305 11/14/19 0143 11/16/19 0820 11/17/19 0314  NA 140   < > 140 143 141 140 140  K 3.5   < > 3.6 3.8 3.7 3.0* 3.8  CL 112*   < > 113* 116* 114* 109 110  CO2 19*   < > 18* 22 21* 21* 24  GLUCOSE 113*   < > 100* 86 100* 103* 91  BUN 10   < > 7* 8 8 7* 7*  CREATININE 0.96   < > 0.91 0.90 0.84 0.85 0.70  CALCIUM 8.2*   < > 8.2* 8.2* 8.2* 8.2* 8.1*  MG 2.0  --  1.9 1.9 1.8 1.9  --   PHOS 2.1*  --  2.1* 2.6 2.4*  --   --    < > = values in this interval not displayed.   GFR: Estimated Creatinine Clearance: 57.5 mL/min (by C-G formula based on SCr of 0.7 mg/dL). Liver Function Tests: Recent Labs  Lab 11/11/19 0310 11/12/19 0329 11/13/19 0305 11/14/19 0143 11/16/19 0820  AST 47* 50* 53* 43* 37  ALT 7 10 16 9 29   ALKPHOS 161* 163* 165* 149* 143*  BILITOT 7.0* 6.2* 5.1* 4.3* 3.9*  PROT 4.9* 5.0* 4.6* 4.5* 4.6*  ALBUMIN 1.8* 1.9* 1.8* 1.7* 1.8*   No results for  input(s): LIPASE, AMYLASE in the last 168 hours. Recent Labs  Lab 11/11/19 1243 11/13/19 0305 11/14/19 0143  AMMONIA 51* 51* 42*   Coagulation Profile: Recent Labs  Lab 11/12/19 0329  INR 1.2   Cardiac Enzymes: No results for input(s): CKTOTAL, CKMB, CKMBINDEX, TROPONINI in the last 168 hours. BNP (last 3 results) No results for input(s): PROBNP in the last 8760 hours. HbA1C: No results for input(s): HGBA1C in the last 72 hours. CBG: No results for input(s): GLUCAP in the last 168 hours. Lipid Profile: No results for input(s): CHOL, HDL, LDLCALC, TRIG, CHOLHDL, LDLDIRECT in the last 72 hours. Thyroid Function Tests: No results for input(s): TSH, T4TOTAL, FREET4, T3FREE, THYROIDAB in the last 72 hours. Anemia Panel: No results for input(s): VITAMINB12, FOLATE, FERRITIN, TIBC, IRON, RETICCTPCT in the last 72 hours.    Radiology Studies: I have reviewed all of the imaging during this hospital visit personally     Scheduled Meds: . amoxicillin-clavulanate  1 tablet Oral Q12H  .  aspirin EC  81 mg Oral Daily  . carbidopa-levodopa  1 tablet Oral TID  . feeding supplement (ENSURE ENLIVE)  237 mL Oral BID BM  . magnesium oxide  400 mg Oral TID  . metoprolol succinate  100 mg Oral Daily  . multivitamin with minerals  1 tablet Oral Daily  . sodium chloride flush  5 mL Intracatheter Q8H  . sodium chloride flush  5 mL Intracatheter Q8H   Continuous Infusions:   LOS: 10 days        Merdith Adan Gerome Apley, MD

## 2019-11-18 DIAGNOSIS — I10 Essential (primary) hypertension: Secondary | ICD-10-CM | POA: Diagnosis not present

## 2019-11-18 DIAGNOSIS — N179 Acute kidney failure, unspecified: Secondary | ICD-10-CM | POA: Diagnosis not present

## 2019-11-18 DIAGNOSIS — R600 Localized edema: Secondary | ICD-10-CM | POA: Diagnosis not present

## 2019-11-18 DIAGNOSIS — K831 Obstruction of bile duct: Secondary | ICD-10-CM | POA: Diagnosis not present

## 2019-11-18 LAB — RESPIRATORY PANEL BY RT PCR (FLU A&B, COVID)
Influenza A by PCR: NEGATIVE
Influenza B by PCR: NEGATIVE
SARS Coronavirus 2 by RT PCR: NEGATIVE

## 2019-11-18 MED ORDER — ADULT MULTIVITAMIN W/MINERALS CH: 1.0000 | ORAL_TABLET | Freq: Every day | ORAL | 0 refills | Status: AC

## 2019-11-18 MED ORDER — ENSURE ENLIVE PO LIQD: 237.0000 mL | Freq: Two times a day (BID) | ORAL | 0 refills | Status: AC

## 2019-11-18 NOTE — Progress Notes (Signed)
Discussed case with primary team as well as her sister-in-law main caregiver and after our discussion she is in favor of referring Ms. Davee to Dr. Gayleen Orem at [facility] for a GI attempt at internalizing drains and tissue diagnosis and this will be set up as an outpatient hopefully this week or next and if unsuccessful will allow him to refer to their interventional team to see if they will have any better luck and please call me sooner as needed

## 2019-11-18 NOTE — Care Management (Signed)
Received insurance authorization for [facility] for 4 days. However, need new covid swab. Patient and sister in law aware. Bedside nurse aware. MD ordered swab.   Levada Dy at [facility] aware and has Tree surgeon.    Ref number  [id number] approved SNF level of care 4/25 next review date 4/28 . If patient not admitted to [facility] by 4/28 she will need a new authorization.   Magdalen Spatz RN

## 2019-11-18 NOTE — Plan of Care (Signed)

## 2019-11-18 NOTE — TOC Transition Note (Signed)
Transition of Care [facility]) - CM/SW Discharge Note   Patient Details  Name: Jessiana Eichorn MRN: [id number] Date of Birth: [birth year]  Transition of Care [facility]) CM/SW Contact:  Alexander Mt, LCSW Phone Number: 11/18/2019, 5:02 PM   Clinical Narrative:    Pt auth received, d/c per arrangements by [facility].   Final next level of care: Skilled Nursing Facility Barriers to Discharge: Barriers Resolved   Patient Goals and CMS Choice Patient states their goals for this hospitalization and ongoing recovery are:: rehab CMS Medicare.gov Compare Post Acute Care list provided to:: Patient Choice offered to / list presented to : Patient  Discharge Placement PASRR number recieved: 11/15/19            Patient chooses bed at: [facility] Patient to be transferred to facility by: [facility] Name of family member notified: pt updated her family Patient and family notified of of transfer: 11/18/19  Discharge Plan and Services   Discharge Planning Services: CM Consult Post Acute Care Choice: [facility]          DME Arranged: N/A         HH Arranged: NA          Social Determinants of Health (SDOH) Interventions     Readmission Risk Interventions No flowsheet data found.

## 2019-11-18 NOTE — Progress Notes (Signed)
AVS, printed prescription, and social worker's paperwork placed in discharge packet. Report called and given to Cristie Hem, LPN at [facility]. All questions answered to satisfaction. [facility] to transport pt with all belongings.

## 2019-11-18 NOTE — Care Management (Signed)
Patient called her brother and sister in law , regarding discharging to [facility] today. Patient stated they are going to bring her clothes here to hospital and help her get dressed prior to her leaving. Explained [facility] has been called and they can drop her belongings off at [facility]. Patient aware.  Magdalen Spatz RN

## 2019-11-18 NOTE — Social Work (Signed)
Clinical Social Worker facilitated patient discharge including contacting patient family and facility to confirm patient discharge plans.  Clinical information faxed to facility and family agreeable with plan.  CSW arranged ambulance transport via [facility] to [facility].  RN to call [phone]  with report prior to discharge.  Clinical Social Worker will sign off for now as social work intervention is no longer needed. Please consult us again if new need arises.  Lanis Storlie, MSW, LCSW Clinical Social Worker   

## 2019-11-18 NOTE — Discharge Summary (Signed)
Physician Discharge Summary  Loria Arehart [id number] DOB: 1940-09-22 DOA: 11/07/2019  PCP: Prince Solian, MD  Admit date: 11/07/2019 Discharge date: 11/18/2019  Admitted From: home  Disposition:  snf  Recommendations for Outpatient Follow-up and new medication changes:  1. Follow up with Dr. Dagmar Hait in 7 days.  2. Patient will be referred to tertiary care for further advance endoscopic procedure, coordinate follow up.  3. Continue to flush drains daily and record output. Do not submerge.  4. Outpatient IR follow up for drain evaluation and exchange in 8 weeks.   Home Health: na  Equipment/Devices: na    Discharge Condition: stable CODE STATUS: full Diet recommendation: soft diet advance as tolerated.   Brief/Interim Summary: Patient was admitted to the hospital with a working diagnosis of biliary obstruction.  79 year old female who has past medical history for hypertension, arthritis, Parkinson's disease, and chronic lower extremity edema. Patient is known to have a biliary obstruction, which has been worked up as an outpatient, she had multiple attempts to relieve obstruction, per IR and ERCP.    03/26.  Right external biliary drain placement 04/01.  Left external biliary drain placement and right extremity drain exchange. Right extremity drain was inadvertently removed 04/17.  Right external biliary drain was replaced, left external biliary drain exchange and right lateral abdomen biloma drain placement.  Postprocedure she was admitted to the hospital due to risk of infection.   Her sodium was 134, potassium 5.1, chloride 103, bicarb 19, glucose 96, BUN 34, creatinine 1.38 AST 44, ALT 15, alkaline phosphatase 174, total bilirubin is 13.7, white count 19.0, hemoglobin 10.8, hematocrit 32.7, platelets 330.  SARS COVID-19 was negative.  Urinalysis negative for infection. 04/14.CT scan with hepatic hilar mass with biliary obstruction and percutaneous drainage catheters.   Right-sided drain was retracted into the abdominal wall, left-sided drain was in place.   Patient was placed on broad-spectrum antibiotic therapy and intravenous fluids. On April 20 she had an unsuccessful attempt to bypass the biliary obstruction by IR.  Her diet was advanced with good toleration, she completed her antibiotic therapy, no significant abdominal pain.    Patient will be referred for tertiary care for further interventions.  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    1. 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 Sp unsuccessful transverse obstruction intervention. Patient with no tissue diagnosis, but very high pretest probability for cholangiocarcinoma.   Patient completed antibiotic therapy with Zosyn in the last 2 days with Augmentin, no significant leukocytosis and her cultures resulted in no growth.  Diet was advanced from clears to soft good toleration.  Patient will be referred to tertiary care for further intervention.   Continue drain care per IR instructions.   2. AKI, hyponatremia.Patient received intravenous fluids, her kidney function stabilized along with her electrolytes.  3. Metabolic encephalopathy. Likely multifactorial, no confusion or agitation, patient will continue physical therapy at a skilled nursing facility.  She is very weak and deconditioned.  4. Parkinson's disease. Patient will continue with Sinemet.    5. HTN.  Continue metoprolol for blood pressure control  6. Multifactorial anemia. Her cell count has remained stable  I spoke with patient's sister in law at the bedside, we talked in detail about patient's condition, plan of care and prognosis and all questions were addressed.   Consultants:  GI   IR  Procedures:  Exchange of left biliary drain for new  8.83F drain.  (white midline to gravity) New right  external biliary drain placement, with 25F drain.  (blue angiodynamics/exodus, to gravity) New peritoneal drain for biloma, 25F.   (blue BS/flexima, to gravity)   Attempt at connection of the left biliary system through the existing biliary stent with gunsight technique, without success.    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 This is causing peritonitis/TTP, and we placed a 25F biloma drain for relief.  - The right and left ducts do not communicate  Antimicrobials:  Zosyn 8 days  Augmentin 2 days     Discharge Diagnoses:  Principal Problem:   Biliary obstruction Active Problems:   Parkinson's disease ([place])   Hypertension   AKI (acute kidney injury) ([place])   Hyponatremia   Leukocytosis   Normocytic anemia   Bilateral leg edema   Increased ammonia level    Discharge Instructions  Discharge Instructions    Discharge instructions   Complete by: As directed    Biliary drains to remain in place.  Flush drains with 5-10 mL sterile saline daily.  Replace dressings as needed to keep drain insertion sites clean and dry.  Plan for follow-up with Interventional Radiology for drain exchange in approximately 8 weeks.  Schedulers will call to arrange.     Allergies as of 11/18/2019      Reactions   Merthiolate [thimerosal] Other (See Comments)   Tincture Merthiolate-skin blistering   Forteo [parathyroid Hormone (recomb)] Other (See Comments)   " strange feeling and I cannot describe how I felt but the Dr took me off of it"      Medication List    STOP taking these medications   amoxicillin 500 MG capsule Commonly known as: AMOXIL   furosemide 20 MG tablet Commonly known as: LASIX   oxycodone 5 MG capsule Commonly known as: OXY-IR     TAKE these medications   aspirin EC 81 MG tablet Take 81 mg by mouth daily.   Caltrate 600+D 600-400 MG-UNIT tablet Generic drug: Calcium Carbonate-Vitamin D Take 1 tablet by mouth daily.   carbidopa-levodopa  25-100 MG tablet Commonly known as: SINEMET IR TAKE 1 TABLET BY MOUTH THREE TIMES DAILY, TAKE 4 HOURS APART What changed:   how much to take  how to take this  when to take this  additional instructions   feeding supplement (ENSURE ENLIVE) Liqd Take 237 mLs by mouth 2 (two) times daily between meals.   guaiFENesin 600 MG 12 hr tablet Commonly known as: MUCINEX Take 600 mg by mouth 2 (two) times daily as needed (congestion).   metoprolol succinate 100 MG 24 hr tablet Commonly known as: TOPROL-XL Take 100 mg by mouth daily. Take with or immediately following a meal.   multivitamin with minerals Tabs tablet Take 1 tablet by mouth daily.   SYSTANE OP Place 1 drop into both eyes daily as needed (dry eyes).   vitamin C 250 MG tablet Commonly known as: ASCORBIC ACID Take 500 mg by mouth daily.   Vitamin D (Ergocalciferol) 1.25 MG (50000 UNIT) Caps capsule Commonly known as: DRISDOL Take 50,000 Units by mouth 2 (two) times a week. Tuesdays and Fridays       Contact information for follow-up providers    Arne Cleveland, MD Follow up.   Specialties: Interventional Radiology, Radiology Why: Schedulers will call to arrange follow-up appointment.  Contact information: [place] [address] [phone]            Contact  information for after-discharge care    Destination    [facility] .   Service: Skilled Nursing Contact information: [place] [phone]                 Allergies  Allergen Reactions  . Merthiolate [Thimerosal] Other (See Comments)    Tincture Merthiolate-skin blistering  . Forteo [Parathyroid Hormone (Recomb)] Other (See Comments)    " strange feeling and I cannot describe how I felt but the Dr took me off of it"    Consultations:  IR  GI    Procedures/Studies: CT ABDOMEN WO CONTRAST  Result Date: 11/11/2019 CLINICAL  DATA:  79 year old with biliary obstruction and status post percutaneous drains. In addition, the patient has a perihepatic drainage catheter. EXAM: CT ABDOMEN WITHOUT CONTRAST TECHNIQUE: Multidetector CT imaging of the abdomen was performed following the standard protocol without IV contrast. COMPARISON:  CT 11/06/2019 FINDINGS: Lower chest: Patient has developed a small right pleural effusion. Small amount of compressive atelectasis at the right lung base. Hepatobiliary: Currently, there is a left external biliary drain and right external biliary drain. There is a metallic stent in the region of the common hepatic duct that extends into the left biliary system. There is pneumobilia associated with this metallic biliary stent and the associated left biliary duct. Lack of IV contrast limits evaluation of the biliary system but there continues to be evidence for intrahepatic biliary dilatation. Narrowing of the metallic biliary stent near the central aspect of the liver compatible with an underlying liver or biliary lesion. There is a percutaneous drain adjacent to the lateral aspect of the right hepatic lobe. Small residual fluid collection along the inferior aspect of the drain and inferior aspect of the right hepatic lobe. Craniocaudal dimension of the collection is not completely visualized but the collection measures greater than 4.8 cm in the craniocaudal dimension. Collection measures 3.9 x 2.2 cm on the axial images, sequence 3, image 43. Pancreas: Stable appearance of the pancreas without significant duct dilatation. Again noted is a round low-density structure just anterior to the pancreatic body and neck region on sequence 3, image 24. This is similar to previous examinations and measures 2.3 x 3.2 cm. Spleen: Normal in size without focal abnormality. Adrenals/Urinary Tract: Normal adrenal glands. Normal appearance of both kidneys without hydronephrosis. Stomach/Bowel: Normal appearance of stomach.  Visualized small and large bowel are unremarkable. No evidence for bowel obstruction. Vascular/Lymphatic: Atherosclerotic calcifications in the abdominal aorta without aneurysm. Other: Small pocket of intraperitoneal air anterior to the left hepatic lobe on sequence 3, image 21. This area is near the left hepatic drain and likely associated with the drain. Musculoskeletal: Facet arthropathy in the lower lumbar spine. IMPRESSION: 1. Bilateral biliary drains are in place. These are external biliary drains that do not extend into the extrahepatic biliary system or duodenum. Evidence for residual intrahepatic biliary dilatation. 2. Metallic biliary stent is located in the common hepatic duct and extends into lateral left hepatic lobe. There is pneumobilia associated with this biliary stent. Marked narrowing in the midportion of stent compatible with underlying biliary or liver lesion. 3. Residual fluid collection in the right inferior perihepatic space. This fluid collection is just caudal to the percutaneous drain and may not be communicating with the drain. 4. Stable rounded lesion anterior to the pancreas. This has been described on previous examinations and may represent a pseudo cyst but indeterminate. 5. New small right pleural effusion. Electronically Signed  By: Markus Daft M.D.   On: 11/11/2019 08:22   CT ABDOMEN PELVIS W CONTRAST  Result Date: 11/06/2019 CLINICAL DATA:  Right lower quadrant pain and jaundice. EXAM: CT ABDOMEN AND PELVIS WITH CONTRAST TECHNIQUE: Multidetector CT imaging of the abdomen and pelvis was performed using the standard protocol following bolus administration of intravenous contrast. CONTRAST:  81mL OMNIPAQUE IOHEXOL 300 MG/ML  SOLN COMPARISON:  10/09/2019 liver MRI FINDINGS: Lower chest:  Mild atelectasis or scarring in the lower lungs. Hepatobiliary: Amorphouscentral hepatic mass poorly defined on this study, roughly 2.9 cm on coronal reformats. There is intrahepatic bile duct  obstruction with left and right biliary drains. The right-sided drain has been retracted into the abdominal wall. The left-sided drain is in position over the liver, although there is still prominent left-sided ductal dilatation at the level of segment to. 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 Spleen: Unremarkable. Adrenals/Urinary Tract: Negative adrenals. No hydronephrosis or stone. Unremarkable bladder. Stomach/Bowel: No obstruction. No appendicitis. Numerous left colonic diverticula. Vascular/Lymphatic: No acute vascular abnormality. Atherosclerotic calcification of the aorta. No mass or adenopathy. Reproductive:No pathologic findings. Other: No ascites or pneumoperitoneum. Shallow bilateral groin hernia with fluid density on the right. Musculoskeletal: No acute abnormalities. L5-S1 advanced facet osteoarthritis with anterolisthesis and accelerated disc narrowing. IMPRESSION: Hepatic hilar mass with biliary obstruction and percutaneous drainage catheters. The right-sided drain has been retracted into the abdominal wall. The left-sided drain is in place although there is still prominent dilation of segment 2 ducts. Electronically Signed   By: Monte Fantasia M.D.   On: 11/06/2019 07:31   IR Guided Niel Hummer W Catheter Placement  Result Date: 11/13/2019 INDICATION: 79 year old female with a history of Klatskin tumor with prior right-sided and left-sided biliary drainage catheters.  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  The right-sided drain has been previously accidentally displaced and remains undrained currently.  EXAM: IMAGE GUIDED EXCHANGE OF LEFT-SIDED DRAINAGE CATHETER  IMAGE GUIDED PLACEMENT OF EXTERNAL RIGHT-SIDED BILIARY DRAIN  IMAGE GUIDED PLACEMENT OF BILOMA DRAIN OF THE UPPER ABDOMEN  ATTEMPT AT GUN SITE  TECHNIQUE CONNECTION OF THE INDWELLING STENT AND THE LEFT-SIDED BILIARY DUCTS WHICH WAS ULTIMATELY UNSUCCESSFUL  MEDICATIONS: None  ANESTHESIA/SEDATION: Moderate (conscious) sedation was employed during this procedure. A total of Versed 4.0 mg and Fentanyl 200 mcg was administered intravenously.  Moderate Sedation Time: 3 hours, 43 minutes. The patient's level of consciousness and vital signs were monitored continuously by radiology nursing throughout the procedure under my direct supervision.  FLUOROSCOPY TIME:  Fluoroscopy Time: 46 minutes 54 seconds (966 mGy).  COMPLICATIONS: None  PROCEDURE: Informed written consent was obtained from the patient after a thorough discussion of the procedural risks, benefits and alternatives. All questions were addressed. Maximal Sterile Barrier Technique was utilized including caps, mask, sterile gowns, sterile gloves, sterile drape, hand hygiene and skin antiseptic. A timeout was performed prior to the initiation of the procedure.  Patient positioned supine position on the fluoroscopy table. Scout images were acquired.  The left-sided drain was injected with contrast confirming location and was removed on a Bentson wire. Short Kumpe the catheter was placed confirming contrast in the left-sided biliary ducts. These do not communicate with the stented system.  Six French sheath was then placed in the biliary system.  Using the angled catheter we attempted to navigate a wire through the tines of the stent. This  was unsuccessful.  The back end of both a standard Glidewire and a stiff Glidewire were used to try to penetrate the tines of the stent, unsuccessful.  We then proceeded with attempt at gun site technique to connect the undrained biliary system with the drain biliary system.  1% lidocaine was used for local anesthesia. A Chiba needle was used for a percutaneous access to the stented biliary system. Once we confirmed needle tip position, 018 V18 wire was advanced  into the stent. A quick cross catheter was successful in crossing the tines of the stent into the biliary system. At this point we recognize that are previously existing left-sided biliary drains had withdrawn with the respiration.  Ultrasound-guided access into the left-sided stents was then performed, using modified Seldinger technique. The 6 French sheath was successfully replaced into the left-sided ducts.  A micro snare was passed percutaneously into the biliary duct, with an ensnare passed through the 6 French sheath. Ultimately the micro snare was too small for targeting.  Up sizing to a 4 [place] [employer] micro puncture was performed. We were then able to pass a 15 mm snare through the 4 French sheath and aligned the 2 snares for the gun site technique.  Chiba needle was then passed through the 2 snares using fluoroscopy and oblique imaging. Once we confirmed needle tip position, the 18 wire was passed through the needle and the needle was removed. The 6 French sheath was closed and withdrawn wall simultaneously withdrawing the 15 mm gooseneck snare within the bile stent.  Withdrawing the wire was performed without fluoroscopy, and the next fluoro image demonstrated that the snare within the stent system failed to engage the wire.  The wire was externalized through the 6 French sheath. Once we recognized that the intraductal snare did not capture the wire, this system was removed from the biliary system.  We then simply replaced the left-sided drain with a 10 French pigtail drainage catheter.  We then proceeded with right-sided drainage.  Ultrasound survey of the right liver was performed, with then ultrasound of the right liver lobe. Window into the right biliary system was present for the right liver approach. Sizable fluid collection on the liver was also demonstrated.  1% lidocaine was used for local anesthesia, with generous infiltration of the skin and subcutaneous tissues in and inter  left costal location. A Chiba needle was advanced under ultrasound guidance into the right liver lobe.  Once the tip of the needle was confirmed within the biliary system by injecting small aliquots of contrast, images were stored of the biliary system after partially opacifying the biliary tree via the needle.  Once the tip of this needle was confirmed within the biliary system, an 018 wire was advanced centrally. The needle was removed, a small incision was made with an 11 blade scalpel, and then a triaxial Accustick system was advanced into the biliary system.  The metal stiffener and dilator were removed, we confirmed placement with contrast infusion.  035 wire was placed. Dilation was performed with 10 [place] dilator, and then a 10 French pigtail catheter was advanced into the right-sided system.  Catheter was formed into the right-sided ducts draining the right-sided ductal system. There was no communication with the stented system.  Finally, given the patient's tenderness and peritoneal signs on palpation, we determined that eighth biloma drain was indicated.  Ultrasound-guided placement of a 10 French biloma drain into the right abdomen was performed using standard modified Seldinger technique.  This  84 French catheter was placed and sutured in position, attached to gravity drainage.  The patient tolerated the procedure well and remained hemodynamically stable throughout.  No complications were encountered and no significant blood loss was encountered.  FINDINGS: Failed gun site technique attempt at connecting the stented biliary system with the left-sided ductal system.  Exchange of left externalized biliary drain for a new 8 [place] drain to gravity.  Placement of a new right-sided externalized biliary drain into the right-sided ducks, 10 [place] drain.  Placement of biloma drain into the lateral right abdomen.  IMPRESSION: Status post failed gun site technique puncture for attempted  connection of the undrained left-sided biliary ductal system with the stented central ductal system.  Status post exchange of left-sided ductal system with new 8 French drain, placement of new right-sided biliary externalized drainage catheter, as well as biloma drain in the upper abdomen.  Signed,  Dulcy Fanny. Dellia Nims, RPVI  [facility]  [facility]   Electronically Signed   By: Corrie Mckusick D.O.   On: 11/08/2019 16:27  DG ERCP BILIARY & PANCREATIC DUCTS  Result Date: 11/07/2019 CLINICAL DATA:  79 year old female with biliary obstruction EXAM: ERCP TECHNIQUE: Multiple spot images obtained with the fluoroscopic device and submitted for interpretation post-procedure. FLUOROSCOPY TIME:  Fluoroscopy Time:  26 minutes 44 seconds COMPARISON:  CT 11/06/2019, fluoro 10/18/2019, 10/24/2019 FINDINGS: Limited intraoperative fluoroscopic spot images during ERCP. Initial image demonstrates the endoscope projecting over the upper abdomen with safety wire in the common bile duct. Partial opacification of the extrahepatic biliary ducts. Subsequently there is deployment of both a retrieval balloon as well as a metallic biliary stent at the site of stricture/occlusion. IMPRESSION: Limited intraoperative fluoroscopic spot images demonstrate treatment of biliary obstruction with deployment of both retrieval balloon as well as metallic biliary stent. Please refer to the dictated operative report for full details of intraoperative findings and procedure. Electronically Signed   By: Corrie Mckusick D.O.   On: 11/07/2019 07:30   IR CHOLANGIOGRAM EXISTING TUBE  Result Date: 11/07/2019 INDICATION: 79 year old with biliary obstruction and undergone multiple procedures for biliary decompression. The patient has had bilateral external biliary drains. Recent CT demonstrated that the right biliary drain was dislodged. Patient underwent ERCP and stent placement on 11/06/2019. Patient  presents for repeat cholangiogram through existing catheter and attempt at internalization of a biliary drain. EXAM: 1. CHOLANGIOGRAM THROUGH EXISTING DRAIN 2. PERCUTANEOUS TRANSHEPATIC CHOLANGIOGRAM WITH ULTRASOUND AND FLUOROSCOPIC GUIDANCE AND PLACEMENT OF NEW LEFT BILIARY DRAIN 3. REMOVAL OF THE DISLODGED RIGHT BILIARY DRAIN MEDICATIONS: Zosyn 3.375 g; The antibiotic was administered within an appropriate time frame prior to the initiation of the procedure. ANESTHESIA/SEDATION: Moderate (conscious) sedation was employed during this procedure. A total of Versed 2.0 mg and Fentanyl 100 mcg was administered intravenously. Moderate Sedation Time: 90 minutes. The patient's level of consciousness and vital signs were monitored continuously by radiology nursing throughout the procedure under my direct supervision. FLUOROSCOPY TIME:  Fluoroscopy Time: 27 minutes, 42 seconds, [id number] mGy COMPLICATIONS: None immediate. PROCEDURE: Informed written consent was obtained from the patient after a thorough discussion of the procedural risks, benefits and alternatives. All questions were addressed. Maximal Sterile Barrier Technique was utilized including caps, mask, sterile gowns, sterile gloves, sterile drape, hand hygiene and skin antiseptic. A timeout was performed prior to the initiation of the procedure. Patient was placed supine on the interventional table. Both drains were prepped and draped in sterile fashion. Scout image of the abdomen was obtained. Left  drain was injected with contrast. The skin around the drain was anesthetized with 1% lidocaine. The retention suture was removed. The catheter was cut and removed over a Bentson wire. Five [place] Kumpe catheter was placed. Additional cholangiogram images were obtained. Eventually, a Roadrunner wire was advanced into branches along the right side of the metallic biliary stent. These branches were not opacifying during the initial contrast injection. A Glide catheter was  advanced into these branches and demonstrated additional dilated bile ducts. These most likely represent medial left hepatic bile ducts. Attempted to access the central biliary ducts along the path of the stent. While trying to access the central biliary system, the percutaneous access was completely lost. Unfortunately, catheter would not go through the old puncture site. Therefore, ultrasound was used to identify the left hepatic lobe. A 21 gauge needle was directed into a dilated left biliary duct using ultrasound guidance. 0.018 wire was advanced into the biliary system and transitional dilator set was placed. Contrast injection confirmed placement into the biliary system. Eventually, the 5 French catheter was again advanced into the left biliary ducts. Access was again obtained into the more medial left biliary ducts. At this point, the decision was made to proceed with another external biliary drain. Initially, a 73 [place] biliary drain was placed but the drain was too long and the side holes were outside of the liver. Therefore, a 10 [place] multipurpose drain was selected and additional side holes were placed. Unfortunately, too many side holes were placed and there was drainage outside of the liver. Therefore, another 10 [place] multipurpose drain was used and 2 additional side holes were placed. The drain was placed over a stiff Amplatz wire. Contrast injection confirmed that the drain was not leaking outside of the liver. However, there was some extravasation near the tip of the catheter. Due to this area of extravasation, the catheter was slightly pulled back. Catheter was attached to a gravity bag. Catheter was sutured to skin. New bandage was placed. FINDINGS: Initial scout image demonstrates that a metallic biliary stent has been placed. Stent extends into the left hepatic ducts. 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 The right-sided  biliary drain was still sutured to the skin but the tube was just underneath the skin, within the subcutaneous tissues and not within the liver. The right biliary drain was completely removed. Multiple cholangiogram images in the left hepatic lobe demonstrate an obstructing lesion in the region of the midportion of stent. Catheter and wire could not be advanced into the central bile ducts or common bile duct. However, catheter was advanced into the more medial left hepatic ducts. After access was obtained into the medial left hepatic ducts, there was some contrast flowing into the biliary stent and common bile duct. Common bile duct distal to the stent appears to be very tortuous. Large amount of retained contrast in the gallbladder from the recent ERCP procedure. External biliary drain was placed in left biliary ducts. Additional side holes were placed in order to adequately decompress the left biliary ducts. Small focus of contrast extravasation near the tip of the external biliary drain. As a result, the biliary drain was slightly pulled back. Ultrasound images during the procedure demonstrate a small amount of right perihepatic ascites. IMPRESSION: 1. Cholangiogram demonstrates persistent biliary obstruction with a recently placed metallic stent in the left hepatic lobe. Narrowing in the mid stent is compatible with the underlying lesion. Unable to cross the  lesion or obstruction from the left percutaneous access. 2. Percutaneous access was lost in left hepatic lobe while trying to cross the biliary obstruction. As a result, a new left percutaneous drain was placed. This is a left external biliary drain. 3. Right biliary drain was known to be dislodged and was completely removed. Small amount of perihepatic ascites noted along the right hepatic lobe. 4. Patient was admitted following the procedure due to the concern for infection after recent ERCP procedure and percutaneous procedure. Patient will need a new  right percutaneous drain in order to decompress the right hepatic lobe. Electronically Signed   By: Markus Daft M.D.   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 The right-sided drain has been previously accidentally displaced and remains undrained currently. EXAM: IMAGE GUIDED EXCHANGE OF LEFT-SIDED DRAINAGE CATHETER IMAGE GUIDED PLACEMENT OF EXTERNAL RIGHT-SIDED BILIARY DRAIN IMAGE GUIDED PLACEMENT OF BILOMA DRAIN OF THE UPPER ABDOMEN ATTEMPT AT GUN SITE TECHNIQUE CONNECTION OF THE INDWELLING STENT AND THE LEFT-SIDED BILIARY DUCTS WHICH WAS ULTIMATELY UNSUCCESSFUL MEDICATIONS: None ANESTHESIA/SEDATION: Moderate (conscious) sedation was employed during this procedure. A total of Versed 4.0 mg and Fentanyl 200 mcg was administered intravenously. Moderate Sedation Time: 3 hours, 43 minutes. The patient's level of consciousness and vital signs were monitored continuously by radiology nursing throughout the procedure under my direct supervision. FLUOROSCOPY TIME:  Fluoroscopy Time: 46 minutes 54 seconds (966 mGy). COMPLICATIONS: None PROCEDURE: Informed written consent was obtained from the patient after a thorough discussion of the procedural risks, benefits and alternatives. All questions were addressed. Maximal Sterile Barrier Technique was utilized including caps, mask, sterile gowns, sterile gloves, sterile drape, hand hygiene and skin antiseptic. A timeout was performed prior to the initiation of the procedure. Patient positioned supine position on the fluoroscopy table. Scout images were acquired. The left-sided drain was injected with contrast confirming location and was removed on a Bentson wire. Short Kumpe  the catheter was placed confirming contrast in the left-sided biliary ducts. These do not communicate with the stented system. Six French sheath was then placed in the biliary system. Using the angled catheter we attempted to navigate a wire through the tines of the stent. This was unsuccessful. The back end of both a standard Glidewire and a stiff Glidewire were used to try to penetrate the tines of the stent, unsuccessful. We then proceeded with attempt at gun site technique to connect the undrained biliary system with the drain biliary system. 1% lidocaine was used for local anesthesia. A Chiba needle was used for a percutaneous access to the stented biliary system. Once we confirmed needle tip position, 018 V18 wire was advanced into the stent. A quick cross catheter was successful in crossing the tines of the stent into the biliary system. At this point we recognize that are previously existing left-sided biliary drains had withdrawn with the respiration. Ultrasound-guided access into the left-sided stents was then performed, using modified Seldinger technique. The 6 French sheath was successfully replaced into the left-sided ducts. A micro snare was passed percutaneously into the biliary duct, with an ensnare passed through the 6 French sheath. Ultimately the micro snare was too small for targeting. Up sizing to a 4 [place] Ang[employer] micro puncture was performed. We were then able  to pass a 15 mm snare through the 4 French sheath and aligned the 2 snares for the gun site technique. Chiba needle was then passed through the 2 snares using fluoroscopy and oblique imaging. Once we confirmed needle tip position, the 18 wire was passed through the needle and the needle was removed. The 6 French sheath was closed and withdrawn wall simultaneously withdrawing the 15 mm gooseneck snare within the bile stent. Withdrawing the wire was performed without fluoroscopy, and the next fluoro image demonstrated that the  snare within the stent system failed to engage the wire. The wire was externalized through the 6 French sheath. Once we recognized that the intraductal snare did not capture the wire, this system was removed from the biliary system. We then simply replaced the left-sided drain with a 10 French pigtail drainage catheter. We then proceeded with right-sided drainage. Ultrasound survey of the right liver was performed, with then ultrasound of the right liver lobe. Window into the right biliary system was present for the right liver approach. Sizable fluid collection on the liver was also demonstrated. 1% lidocaine was used for local anesthesia, with generous infiltration of the skin and subcutaneous tissues in and inter left costal location. A Chiba needle was advanced under ultrasound guidance into the right liver lobe. Once the tip of the needle was confirmed within the biliary system by injecting small aliquots of contrast, images were stored of the biliary system after partially opacifying the biliary tree via the needle. Once the tip of this needle was confirmed within the biliary system, an 018 wire was advanced centrally. The needle was removed, a small incision was made with an 11 blade scalpel, and then a triaxial Accustick system was advanced into the biliary system. The metal stiffener and dilator were removed, we confirmed placement with contrast infusion. 035 wire was placed. Dilation was performed with 10 [place] dilator, and then a 10 French pigtail catheter was advanced into the right-sided system. Catheter was formed into the right-sided ducts draining the right-sided ductal system. There was no communication with the stented system. Finally, given the patient's tenderness and peritoneal signs on palpation, we determined that eighth biloma drain was indicated. Ultrasound-guided placement of a 10 French biloma drain into the right abdomen was performed using standard modified Seldinger technique. This 10  French catheter was placed and sutured in position, attached to gravity drainage. The patient tolerated the procedure well and remained hemodynamically stable throughout. No complications were encountered and no significant blood loss was encountered. FINDINGS: Failed gun site technique attempt at connecting the stented biliary system with the left-sided ductal system. Exchange of left externalized biliary drain for a new 8 [place] drain to gravity. Placement of a new right-sided externalized biliary drain into the right-sided ducks, 10 [place] drain. Placement of biloma drain into the lateral right abdomen. IMPRESSION: Status post failed gun site technique puncture for attempted connection of the undrained left-sided biliary ductal system with the stented central ductal system. Status post exchange of left-sided ductal system with new 8 French drain, placement of new right-sided biliary externalized drainage catheter, as well as biloma drain in the upper abdomen. Signed, Dulcy Fanny. Dellia Nims, RPVI [facility] Radiology Electronically Signed   By: Corrie Mckusick D.O.   On: 11/08/2019 16:27   IR BILIARY DRAIN PLACEMENT WITH CHOLANGIOGRAM  Result Date: 11/07/2019 INDICATION: 79 year old with biliary obstruction and undergone multiple procedures for biliary decompression. The patient has had bilateral external biliary drains. Recent CT  demonstrated that the right biliary drain was dislodged. Patient underwent ERCP and stent placement on 11/06/2019. Patient presents for repeat cholangiogram through existing catheter and attempt at internalization of a biliary drain. EXAM: 1. CHOLANGIOGRAM THROUGH EXISTING DRAIN 2. PERCUTANEOUS TRANSHEPATIC CHOLANGIOGRAM WITH ULTRASOUND AND FLUOROSCOPIC GUIDANCE AND PLACEMENT OF NEW LEFT BILIARY DRAIN 3. REMOVAL OF THE DISLODGED RIGHT BILIARY DRAIN MEDICATIONS: Zosyn 3.375 g; The antibiotic was administered within an appropriate time frame  prior to the initiation of the procedure. ANESTHESIA/SEDATION: Moderate (conscious) sedation was employed during this procedure. A total of Versed 2.0 mg and Fentanyl 100 mcg was administered intravenously. Moderate Sedation Time: 90 minutes. The patient's level of consciousness and vital signs were monitored continuously by radiology nursing throughout the procedure under my direct supervision. FLUOROSCOPY TIME:  Fluoroscopy Time: 27 minutes, 42 seconds, [id number] mGy COMPLICATIONS: None immediate. PROCEDURE: Informed written consent was obtained from the patient after a thorough discussion of the procedural risks, benefits and alternatives. All questions were addressed. Maximal Sterile Barrier Technique was utilized including caps, mask, sterile gowns, sterile gloves, sterile drape, hand hygiene and skin antiseptic. A timeout was performed prior to the initiation of the procedure. Patient was placed supine on the interventional table. Both drains were prepped and draped in sterile fashion. Scout image of the abdomen was obtained. Left drain was injected with contrast. The skin around the drain was anesthetized with 1% lidocaine. The retention suture was removed. The catheter was cut and removed over a Bentson wire. Five [place] Kumpe catheter was placed. Additional cholangiogram images were obtained. Eventually, a Roadrunner wire was advanced into branches along the right side of the metallic biliary stent. These branches were not opacifying during the initial contrast injection. A Glide catheter was advanced into these branches and demonstrated additional dilated bile ducts. These most likely represent medial left hepatic bile ducts. Attempted to access the central biliary ducts along the path of the stent. While trying to access the central biliary system, the percutaneous access was completely lost. Unfortunately, catheter would not go through the old puncture site. Therefore, ultrasound was used to identify the left  hepatic lobe. A 21 gauge needle was directed into a dilated left biliary duct using ultrasound guidance. 0.018 wire was advanced into the biliary system and transitional dilator set was placed. Contrast injection confirmed placement into the biliary system. Eventually, the 5 French catheter was again advanced into the left biliary ducts. Access was again obtained into the more medial left biliary ducts. At this point, the decision was made to proceed with another external biliary drain. Initially, a 31 [place] biliary drain was placed but the drain was too long and the side holes were outside of the liver. Therefore, a 10 [place] multipurpose drain was selected and additional side holes were placed. Unfortunately, too many side holes were placed and there was drainage outside of the liver. Therefore, another 10 [place] multipurpose drain was used and 2 additional side holes were placed. The drain was placed over a stiff Amplatz wire. Contrast injection confirmed that the drain was not leaking outside of the liver. However, there was some extravasation near the tip of the catheter. Due to this area of extravasation, the catheter was slightly pulled back. Catheter was attached to a gravity bag. Catheter was sutured to skin. New bandage was placed. FINDINGS: Initial scout image demonstrates that a metallic biliary stent has been placed. Stent extends into the left hepatic ducts. The mid portion the stent is markedly narrowed and compatible with an underlying  lesion, likely malignant. The right-sided biliary drain was still sutured to the skin but the tube was just underneath the skin, within the subcutaneous tissues and not within the liver. The right biliary drain was completely removed. Multiple cholangiogram images in the left hepatic lobe demonstrate an obstructing lesion in the region of the midportion of stent. Catheter and wire could not be advanced into the central bile ducts or common bile duct. However, catheter  was advanced into the more medial left hepatic ducts. After access was obtained into the medial left hepatic ducts, there was some contrast flowing into the biliary stent and common bile duct. Common bile duct distal to the stent appears to be very tortuous. Large amount of retained contrast in the gallbladder from the recent ERCP procedure. External biliary drain was placed in left biliary ducts. Additional side holes were placed in order to adequately decompress the left biliary ducts. Small focus of contrast extravasation near the tip of the external biliary drain. As a result, the biliary drain was slightly pulled back. Ultrasound images during the procedure demonstrate a small amount of right perihepatic ascites. IMPRESSION: 1. Cholangiogram demonstrates persistent biliary obstruction with a recently placed metallic stent in the left hepatic lobe. Narrowing in the mid stent is compatible with the underlying lesion. Unable to cross the lesion or obstruction from the left percutaneous access. 2. Percutaneous access was lost in left hepatic lobe while trying to cross the biliary obstruction. As a result, a new left percutaneous drain was placed. This is a left external biliary drain. 3. Right biliary drain was known to be dislodged and was completely removed. Small amount of perihepatic ascites noted along the right hepatic lobe. 4. Patient was admitted following the procedure due to the concern for infection after recent ERCP procedure and percutaneous procedure. Patient will need a new right percutaneous drain in order to decompress the right hepatic lobe. Electronically Signed   By: Markus Daft M.D.   On: 11/07/2019 17:19   IR BILIARY DRAIN PLACEMENT WITH CHOLANGIOGRAM  Result Date: 10/24/2019 INDICATION: Jaundice, high biliary confluence obstruction EXAM: Exchange of the right external only biliary drain Ultrasound and fluoroscopic insertion of a left external only biliary drain Unsuccessful attempt at  crossing the high biliary obstruction from either side. MEDICATIONS: 3.375 g Zosyn; The antibiotic was administered within an appropriate time frame prior to the initiation of the procedure. ANESTHESIA/SEDATION: Moderate (conscious) sedation was employed during this procedure. A total of Versed 1.5 mg and Fentanyl 75 mcg was administered intravenously. Moderate Sedation Time: 39 minutes. The patient's level of consciousness and vital signs were monitored continuously by radiology nursing throughout the procedure under my direct supervision. FLUOROSCOPY TIME:  Fluoroscopy Time: 13 minutes 36 seconds (75 mGy). COMPLICATIONS: None immediate. PROCEDURE: Informed written consent was obtained from the patient after a thorough discussion of the procedural risks, benefits and alternatives. All questions were addressed. Maximal Sterile Barrier Technique was utilized including caps, mask, sterile gowns, sterile gloves, sterile drape, hand hygiene and skin antiseptic. A timeout was performed prior to the initiation of the procedure. Right biliary drain procedure: Under sterile conditions and local anesthesia, the external only right biliary drain was injected with contrast confirming position at the confluence. Obstruction remains. Catheter removed over an Amplatz guidewire. Eight French sheath inserted. Attempts were made to cross the central biliary obstruction with catheters and guidewires. This is unsuccessful. Over the Amplatz guidewire, a new Bettey Mare 10 [place] biliary drain was advanced with the retention loop formed in  the dilated central right ducts. Contrast injection confirms position. Images obtained for documentation. Left biliary drain procedure: Ultrasound performed of the left hepatic lobe through a subxiphoid window. This was correlated with the MRI scan. There is marked dilatation of the left ducts which are undrained by the right side only external drain. Under ultrasound guidance, a dilated left  hepatic duct was accessed peripherally from a subxiphoid approach. Images obtained for documentation. There was return of clear bile. Micro dilator inserted followed by the Accustick dilator set. Amplatz guidewire inserted followed by a Kumpe catheter. Catheter guidewire manipulation performed to attempt crossing the central biliary high obstruction from the left duct access. This also was unsuccessful. Over the Amplatz guidewire, a 10 French Bettey Mare catheter was advanced with the retention loop formed in the dilated left biliary tree. Images obtained for documentation. Both catheters secured with Prolene sutures and external gravity drainage bag. No immediate complication. Patient tolerated the procedure well. IMPRESSION: Fluoroscopic exchange of the right external only biliary drain. Successful ultrasound and fluoroscopic insertion of a left external only biliary drain Unsuccessful attempt at crossing the high biliary obstruction from either side. Electronically Signed   By: Jerilynn Mages.  Shick M.D.   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  The right-sided drain has been previously accidentally displaced and remains undrained currently.  EXAM: IMAGE GUIDED EXCHANGE OF LEFT-SIDED DRAINAGE CATHETER  IMAGE GUIDED PLACEMENT OF EXTERNAL RIGHT-SIDED BILIARY DRAIN  IMAGE GUIDED PLACEMENT OF BILOMA DRAIN OF THE UPPER ABDOMEN  ATTEMPT AT GUN SITE TECHNIQUE CONNECTION OF THE INDWELLING STENT AND THE LEFT-SIDED BILIARY DUCTS WHICH WAS ULTIMATELY UNSUCCESSFUL  MEDICATIONS: None  ANESTHESIA/SEDATION: Moderate (conscious) sedation was employed during this procedure. A total of Versed 4.0 mg and Fentanyl 200 mcg was administered  intravenously.  Moderate Sedation Time: 3 hours, 43 minutes. The patient's level of consciousness and vital signs were monitored continuously by radiology nursing throughout the procedure under my direct supervision.  FLUOROSCOPY TIME:  Fluoroscopy Time: 46 minutes 54 seconds (966 mGy).  COMPLICATIONS: None  PROCEDURE: Informed written consent was obtained from the patient after a thorough discussion of the procedural risks, benefits and alternatives. All questions were addressed. Maximal Sterile Barrier Technique was utilized including caps, mask, sterile gowns, sterile gloves, sterile drape, hand hygiene and skin antiseptic. A timeout was performed prior to the initiation of the procedure.  Patient positioned supine position on the fluoroscopy table. Scout images were acquired.  The left-sided drain was injected with contrast confirming location and was removed on a Bentson wire. Short Kumpe the catheter was placed confirming contrast in the left-sided biliary ducts. These do not communicate with the stented system.  Six French sheath was then placed in the biliary system.  Using the angled catheter we attempted to navigate a wire through the tines of the stent. This was unsuccessful.  The back end of both a standard Glidewire and a stiff Glidewire were used to try to penetrate the tines of the stent, unsuccessful.  We then proceeded with attempt at gun site technique to connect the undrained biliary system with the drain biliary system.  1% lidocaine was used for local anesthesia. A Chiba needle was used for a percutaneous access to the stented biliary system. Once we confirmed needle tip  position, 018 V18 wire was advanced into the stent. A quick cross catheter was successful in crossing the tines of the stent into the biliary system. At this point we recognize that are previously existing left-sided biliary drains had withdrawn with the respiration.  Ultrasound-guided access into the left-sided  stents was then performed, using modified Seldinger technique. The 6 French sheath was successfully replaced into the left-sided ducts.  A micro snare was passed percutaneously into the biliary duct, with an ensnare passed through the 6 French sheath. Ultimately the micro snare was too small for targeting.  Up sizing to a 4 [place] Ang[employer] micro puncture was performed. We were then able to pass a 15 mm snare through the 4 French sheath and aligned the 2 snares for the gun site technique.  Chiba needle was then passed through the 2 snares using fluoroscopy and oblique imaging. Once we confirmed needle tip position, the 18 wire was passed through the needle and the needle was removed. The 6 French sheath was closed and withdrawn wall simultaneously withdrawing the 15 mm gooseneck snare within the bile stent.  Withdrawing the wire was performed without fluoroscopy, and the next fluoro image demonstrated that the snare within the stent system failed to engage the wire.  The wire was externalized through the 6 French sheath. Once we recognized that the intraductal snare did not capture the wire, this system was removed from the biliary system.  We then simply replaced the left-sided drain with a 10 French pigtail drainage catheter.  We then proceeded with right-sided drainage.  Ultrasound survey of the right liver was performed, with then ultrasound of the right liver lobe. Window into the right biliary system was present for the right liver approach. Sizable fluid collection on the liver was also demonstrated.  1% lidocaine was used for local anesthesia, with generous infiltration of the skin and subcutaneous tissues in and inter left costal location. A Chiba needle was advanced under ultrasound guidance into the right liver lobe.  Once the tip of the needle was confirmed within the biliary system by injecting small aliquots of contrast, images were stored of the biliary system after partially opacifying  the biliary tree via the needle.  Once the tip of this needle was confirmed within the biliary system, an 018 wire was advanced centrally. The needle was removed, a small incision was made with an 11 blade scalpel, and then a triaxial Accustick system was advanced into the biliary system.  The metal stiffener and dilator were removed, we confirmed placement with contrast infusion.  035 wire was placed. Dilation was performed with 10 [place] dilator, and then a 10 French pigtail catheter was advanced into the right-sided system.  Catheter was formed into the right-sided ducts draining the right-sided ductal system. There was no communication with the stented system.  Finally, given the patient's tenderness and peritoneal signs on palpation, we determined that eighth biloma drain was indicated.  Ultrasound-guided placement of a 10 French biloma drain into the right abdomen was performed using standard modified Seldinger technique.  This 10 French catheter was placed and sutured in position, attached to gravity drainage.  The patient tolerated the procedure well and remained hemodynamically stable throughout.  No complications were encountered and no significant blood loss was encountered.  FINDINGS: Failed gun site technique attempt at connecting the stented biliary system with the left-sided ductal system.  Exchange of left externalized biliary drain for a new 8 [place] drain to gravity.  Placement of a new  right-sided externalized biliary drain into the right-sided ducks, 10 [place] drain.  Placement of biloma drain into the lateral right abdomen.  IMPRESSION: Status post failed gun site technique puncture for attempted connection of the undrained left-sided biliary ductal system with the stented central ductal system.  Status post exchange of left-sided ductal system with new 8 French drain, placement of new right-sided biliary externalized drainage catheter, as well as biloma drain in the upper  abdomen.  Signed,  Dulcy Fanny. Dellia Nims, RPVI  [facility]  [facility]   Electronically Signed   By: Corrie Mckusick D.O.   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 New right-sided external biliary drain catheter placed 11/08/2019. Biloma drain catheter placed lateral to the liver. EXAM: EXCHANGE OF EXTERNAL BILIARY DRAIN CATHETER UNDER FLUOROSCOPY MEDICATIONS: Patient was receiving adequate antibiotic coverage as an inpatient ANESTHESIA/SEDATION: Intravenous Fentanyl 27mcg and Versed 1mg  were administered as conscious sedation during continuous monitoring of the patient's level of consciousness and physiological / cardiorespiratory status by the radiology RN, with a total moderate sedation time of 46 minutes. PROCEDURE: Informed written consent was obtained from the patient and family after a thorough discussion of the procedural risks, benefits and alternatives. All questions were addressed. Maximal Sterile Barrier Technique was utilized including caps, mask, sterile gowns, sterile gloves, sterile drape, hand hygiene and skin antiseptic. A timeout was performed prior to the initiation of the procedure. The skin entry site of the right external biliary drain was infiltrated with 1% lidocaine. Retention suture was cut. Cholangiogram showed good position in the right biliary system, which was decompressed. No extravasation. The catheter was cut and exchanged over a short Amplatz wire for a 9 French short vascular sheath, through which a 5 [place] Kumpe catheter was advanced to the biliary confluence. Multiple attempts were made to traverse the obstruction using the  cholangiogram and the endoscopic metallic stent as guidance in multiple projections with a variety of catheter guidewire techniques. Several false passages were created, but access to the CBD could not ultimately be achieved. Ultimately, the Kumpe catheter and sheath were exchanged back for a 12 French Dawson Mueller catheter, formed centrally within the right biliary tree access external biliary drain. The patient tolerated the procedure well. FLUOROSCOPY TIME:  21.4 minutes; [id number] [id number] DAP COMPLICATIONS: None immediate. IMPRESSION: 1. The obstruction of the right biliary tree at the biliary confluence could not be traversed antegrade. 2. 12 Leonides Schanz catheter placed as right external biliary drain. Electronically Signed   By: Lucrezia Europe M.D.   On: 11/12/2019 17:39        Subjective: Patient is feeling well, intermittent mild nausea but no vomiting, no chest pain or dyspnea.   Discharge Exam: Vitals:   11/18/19 0412 11/18/19 0414  BP: (!) 148/60 (!) 148/60  Pulse: (!) 57 (!) 57  Resp: 18 17  Temp:  98.1 F (36.7 C)  SpO2: 99% 99%   Vitals:   11/17/19 2023 11/18/19 0412 11/18/19 0414 11/18/19 0609  BP: (!) 135/58 (!) 148/60 (!) 148/60   Pulse: 60 (!) 57 (!) 57   Resp: 16 18 17    Temp: 98.1 F (36.7 C)  98.1 F (36.7 C)   TempSrc: Oral  Oral   SpO2: 100%  99% 99%   Weight:    75.5 kg  Height:        General: Not in pain or dyspnea.  Neurology: Awake and alert, non focal  E ENT: mild pallor, with mild icterus, oral mucosa moist Cardiovascular: No JVD. S1-S2 present, rhythmic, no gallops, rubs, or murmurs. Trace pitting bilatearl lower extremity edema. Pulmonary: positive breath sounds bilaterally, adequate air movement, no wheezing, rhonchi or rales. Gastrointestinal. Abdomen with no organomegaly, non tender, no rebound or guarding/ drain in place.  Skin. No rashes Musculoskeletal: no joint deformities   The results of significant diagnostics from this  hospitalization (including imaging, microbiology, ancillary and laboratory) are listed below for reference.     Microbiology: No results found for this or any previous visit (from the past 240 hour(s)).   Labs: BNP (last 3 results) No results for input(s): BNP in the last 8760 hours. Basic Metabolic Panel: Recent Labs  Lab 11/12/19 0329 11/13/19 0305 11/14/19 0143 11/16/19 0820 11/17/19 0314  NA 140 143 141 140 140  K 3.6 3.8 3.7 3.0* 3.8  CL 113* 116* 114* 109 110  CO2 18* 22 21* 21* 24  GLUCOSE 100* 86 100* 103* 91  BUN 7* 8 8 7* 7*  CREATININE 0.91 0.90 0.84 0.85 0.70  CALCIUM 8.2* 8.2* 8.2* 8.2* 8.1*  MG 1.9 1.9 1.8 1.9  --   PHOS 2.1* 2.6 2.4*  --   --    Liver Function Tests: Recent Labs  Lab 11/12/19 0329 11/13/19 0305 11/14/19 0143 11/16/19 0820  AST 50* 53* 43* 37  ALT 10 16 9 29   ALKPHOS 163* 165* 149* 143*  BILITOT 6.2* 5.1* 4.3* 3.9*  PROT 5.0* 4.6* 4.5* 4.6*  ALBUMIN 1.9* 1.8* 1.7* 1.8*   No results for input(s): LIPASE, AMYLASE in the last 168 hours. Recent Labs  Lab 11/11/19 1243 11/13/19 0305 11/14/19 0143  AMMONIA 51* 51* 42*   CBC: Recent Labs  Lab 11/12/19 0329 11/13/19 0305 11/14/19 0143 11/16/19 0820  WBC 8.1 6.2 6.0 5.7  NEUTROABS  --   --   --  4.4  HGB 10.3* 10.0* 10.0* 10.8*  HCT 31.7* 31.4* 31.8* 33.9*  MCV 97.2 99.4 99.4 99.7  PLT 397 297 319 394   Cardiac Enzymes: No results for input(s): CKTOTAL, CKMB, CKMBINDEX, TROPONINI in the last 168 hours. BNP: Invalid input(s): POCBNP CBG: No results for input(s): GLUCAP in the last 168 hours. D-Dimer No results for input(s): DDIMER in the last 72 hours. Hgb A1c No results for input(s): HGBA1C in the last 72 hours. Lipid Profile No results for input(s): CHOL, HDL, LDLCALC, TRIG, CHOLHDL, LDLDIRECT in the last 72 hours. Thyroid function studies No results for input(s): TSH, T4TOTAL, T3FREE, THYROIDAB in the last 72 hours.  Invalid input(s): FREET3 Anemia work up No  results for input(s): VITAMINB12, FOLATE, FERRITIN, TIBC, IRON, RETICCTPCT in the last 72 hours. Urinalysis    Component Value Date/Time   COLORURINE AMBER (A) 11/06/2019 0615   APPEARANCEUR HAZY (A) 11/06/2019 0615   LABSPEC 1.020 11/06/2019 0615   PHURINE 5.0 11/06/2019 0615   GLUCOSEU NEGATIVE 11/06/2019 0615   HGBUR NEGATIVE 11/06/2019 0615   BILIRUBINUR SMALL (A) 11/06/2019 0615   KETONESUR NEGATIVE 11/06/2019 0615   PROTEINUR 30 (A) 11/06/2019 0615   UROBILINOGEN 0.2 10/24/2014 0715   NITRITE NEGATIVE 11/06/2019 0615   LEUKOCYTESUR NEGATIVE 11/06/2019 0615   Sepsis Labs Invalid input(s): PROCALCITONIN,  WBC,  LACTICIDVEN Microbiology No results found for this or any previous visit (from  the past 240 hour(s)).   Time coordinating discharge: 45 minutes  SIGNED:   Tawni Millers, MD  [facility] 11/18/2019, 10:22 AM

## 2019-11-19 LAB — CYTOLOGY - NON PAP

## 2019-12-10 NOTE — Progress Notes (Signed)
Spoke with patient's daughter Hailey [name] to let her know we received a referral from Dr. Watt Climes.  I informed her I haver her scheduled to see Dr. Burr Medico on Thursday 5/20 at 3pm to arrive by 2:45 pm.  She is aware of our location, valet parking is available and wheelchairs are available.  She verbalized an understanding and agreed to the appointment.   She was given my direct phone number and I explained my role as GI nurse navigator.   Records from [facility] were sent to HIM for scanning to chart.

## 2019-12-11 NOTE — Progress Notes (Signed)
[place]   [phone] Fax:([phone]) [place] Note   Patient Care Team: Prince Solian, MD as PCP - General (Internal Medicine) Jonnie Finner, RN as Oncology Nurse Navigator Truitt Merle, MD as Consulting Physician (Hematology)  Date of Service:  12/12/2019   CHIEF COMPLAINTS/PURPOSE OF CONSULTATION:  Intrahepatic Cholangiocarcinoma  REFERRING PHYSICIAN:  Dr Watt Climes  Oncology History Overview Note  Cancer Staging No matching staging information was found for the patient.    Intrahepatic cholangiocarcinoma ([place])  08/27/2019 Procedure   ERCP by Dr Watt Climes 08/27/19  IMPRESSION - The major papilla appeared normal. 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 The stricture was indeterminate. This stricture was treated with biliary sphincterotomy. - An extrinsic impression on the biliary tract was seen. - A sphincterotomy was performed. - The biliary tree was swept and nothing was found. - Cells for cytology obtained at the hepatic duct bifurcation. - Biopsy was performed at the hepatic duct bifurcation.   10/04/2019 Tumor Marker   Ca 19-9 - 95 10/04/19 CEA - 28.3 10/04/19   10/09/2019 Imaging   MRI/MRCP 10/09/19 IMPRESSION: 1. Mildly motion degraded exam. 2. Progressive biliary duct dilatation secondary to an amorphous mass within the central left hepatic lobe, highly suspicious for cholangiocarcinoma. Consider biliary drainage. 3. 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 Although by consensus criteria, this warrants follow-up with MRI at 1 year, given comorbidities, of questionable clinical significance. 4.  Aortic Atherosclerosis (ICD10-I70.0).   10/15/2019 Procedure     ERCP by Dr Watt Climes 10/15/19  IMPRESSION - The major papilla appeared normal. - A single severe localized biliary stricture was found in the common bile duct. The stricture was  indeterminate. This stricture was treated with biliary sphincterotomy. - An extrinsic impression on the common bile duct was seen. - A sphincterotomy was performed. One pancreatic injection and wire advancement was done at the beginning of the procedure unable to advance wire past the obstruction despite a prolonged effort as above   11/06/2019 Procedure   ERCP by Dr Watt Climes 11/06/19  IMPRESSION - The major papilla appeared normal. - The biliary tree was swept and sludge was found. - Common bile duct was successfully dilated. - One uncovered metal stent was placed into the common hepatic duct difficult to say which branch based on her procedure being done on her left side to protect her existing drains.   11/06/2019 Imaging   CT AP contrast 11/06/19  IMPRESSION: Hepatic hilar mass with biliary obstruction and percutaneous drainage catheters. The right-sided drain has been retracted into the abdominal wall. The left-sided drain is in place although there is still prominent dilation of segment 2 ducts.   11/10/2019 Imaging   CT AP 11/10/19  IMPRESSION: 1. Bilateral biliary drains are in place. These are external biliary drains that do not extend into the extrahepatic biliary system or duodenum. Evidence for residual intrahepatic biliary dilatation. 2. Metallic biliary stent is located in the common hepatic duct and extends into lateral left hepatic lobe. There is pneumobilia associated with this biliary stent. Marked narrowing in the midportion of stent compatible with underlying biliary or liver lesion. 3. Residual fluid collection in the right inferior perihepatic space. This fluid collection is just caudal to the percutaneous drain and may not be communicating with the drain. 4. Stable rounded lesion anterior to the pancreas. This has been described on previous examinations and may represent a pseudo cyst but indeterminate.  5. New small right pleural effusion.   11/14/2019 Pathology  Results   PATH - 11/14/19 FINAL MICROSCOPIC DIAGNOSIS:  - Atypical cells present  - See comment   DIAGNOSTIC COMMENTS:  Dr. Jeannie Done reviewed the case and concurs with the diagnosis.    12/03/2019 Procedure   ERCP at [facility] - Severe segmental biliary strictures were found. The                         strictures were malignant appearing.                        - The right main hepatic duct, right intrahepatic                         branches and left intrahepatic branches were severely                         dilated, with an obstruction.                        - One plastic stent was placed into the right hepatic                         duct via the hepaticoduodenostomy stent.                        - One plastic biliary stent was placed into the left                         hepatic duct via the hepaticogastrostomy stent.   12/03/2019 Procedure   EUS at [place]   - One metal stent was visualized endosonographically                         in the bifurcation of the common hepatic duct and in                         the left intrahepatic bile duct(s). Two percutaneous                         drains were seen.                        - There was a suggestion of a stricture in the                         bifurcation of the common hepatic duct. Fine needle                         biopsy performed.                        - There was dilation in the left intrahepatic bile                         duct(s) and in the right intrahepatic bile duct(s)                         which measured up  to 6 mm.                        - Successful EUS-guided hepaticoduodenostomy.                        - Successful EUS-guided hepaticogastrostomy.   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  There is scant questionable positivity for CDX2. The immunohistochemical profile is not entirely specific, but the differential diagnosis includes primary sites in the pancreatobiliary tree and upper GI tract.  The villiform epithelium raises the possibility of a component of an intraductal papillary mucinous neoplasm.  Correlation with radiographic findings recommended.     12/12/2019 Initial Diagnosis   Intrahepatic cholangiocarcinoma (HCC)      HISTORY OF PRESENTING ILLNESS:  Hailey [name] 79 y.o. female is a here because of intrahepatic cholangiocarcinoma. The patient was referred by Dr Watt Climes. The patient presents to the clinic today accompanied by her sister in law Hailey [name]. She lives in [place] in [place] home since 11/18/19. Prior to this she lives home alone.   She started feeling sick 06/29/20. She was having sharp electrical right chest pain., unrelated to her heart. Work up with MRI showed lesions in her liver. She started having Jaundice in late 08/2019. She was referred to her GI  Dr Watt Climes. Dr Watt Climes and IR tried 3 times to access her tumor and only could place stent. Her mass was able to be biopsied when she [facility] which showed cancer.  She notes now she has more fatigued than before. She has help shower and dress but mostly by herself. She denies any more chest or abdominal pain. She is able to eat adequately. She only notes nausea based on the sweet foods. She denies vomiting, cramps, bloating. She has normal daily Bm with no black or bloody stool, no SOB. She had recently increased LE edema up her legs.  She notes she has been be confused at times. Her family has noticed this as well.   Socially she is single.  She did smoke for 10 years 1/2 ppd. She is retired form Paediatric nurse. She lived in 1 level home before moving to [place] home.   They have a PMHx of controlled HTN, Parkinson's Disease Dx in 2018. She has multiple procedures and ortho surgeries. She notes 4 cousins with middle age breast cancer. I reviewed her medication list with her.    REVIEW OF SYSTEMS:    Constitutional: Denies fevers, chills or abnormal night sweats Eyes: Denies blurriness of vision, double vision or watery eyes Ears, nose, mouth, throat, and face: Denies mucositis or sore throat Respiratory: Denies cough, dyspnea or wheezes Cardiovascular: Denies palpitation, chest discomfort (+) b/l lower extremity swelling Gastrointestinal:  Denies nausea, heartburn or change in bowel habits Skin: Denies abnormal skin rashes Lymphatics: Denies new lymphadenopathy or easy bruising Neurological:Denies numbness, tingling or new weaknesses (+) Mild Confusion Behavioral/Psych: Mood is stable, no new changes  All other systems were reviewed with the patient and are negative.  MEDICAL HISTORY:  Past  Medical History:  Diagnosis Date  . 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 Bilateral leg edema    takes lasix as needed for dependent edema   . H/O exercise stress test    4-5 yrs. ago, told that it was wnl , done at [place] office   . Headache    MH: Migraines  . Hypertension   . Neuromuscular disorder ([place])   . Osteoporosis   . Parkinson disease ([place])   . Wears glasses     SURGICAL HISTORY: Past Surgical History:  Procedure Laterality Date  . BILIARY BRUSHING  08/27/2019   Procedure: BILIARY BRUSHING;  Surgeon: Clarene Essex, MD;  Location: [facility];  Service: Endoscopy;;  . BILIARY DILATION  11/06/2019   Procedure: BILIARY DILATION;  Surgeon: Clarene Essex, MD;  Location: [facility];  Service: Endoscopy;;  . BILIARY STENT PLACEMENT N/A 11/06/2019   Procedure: BILIARY STENT PLACEMENT;  Surgeon: Clarene Essex, MD;  Location: [facility];  Service: Endoscopy;  Laterality: N/A;  . BIOPSY  08/27/2019   Procedure: BIOPSY;  Surgeon: Clarene Essex, MD;  Location: [facility];  Service: Endoscopy;;  . BREAST CYST EXCISION Left 1994   benign  . cataract surgery Bilateral 2007  . ERCP N/A 08/27/2019   Procedure: ENDOSCOPIC RETROGRADE CHOLANGIOPANCREATOGRAPHY (ERCP);  Surgeon: Clarene Essex, MD;  Location: [facility];  Service: Endoscopy;  Laterality: N/A;  balloon sweep  . ERCP N/A 10/15/2019   Procedure: ENDOSCOPIC RETROGRADE CHOLANGIOPANCREATOGRAPHY (ERCP);  Surgeon: Clarene Essex, MD;  Location: [facility];  Service: Endoscopy;  Laterality: N/A;  . ERCP N/A 11/06/2019   Procedure: ENDOSCOPIC RETROGRADE CHOLANGIOPANCREATOGRAPHY (ERCP);  Surgeon: Clarene Essex, MD;  Location: [facility];  Service: Endoscopy;  Laterality: N/A;  . EYE SURGERY     /w IOL  . FOOT TUMOR EXCISION Left 1992   benign  . IR BILIARY DRAIN PLACEMENT WITH CHOLANGIOGRAM  10/18/2019  . IR BILIARY DRAIN PLACEMENT WITH CHOLANGIOGRAM  10/24/2019  . IR BILIARY DRAIN PLACEMENT WITH CHOLANGIOGRAM  11/07/2019  . IR BILIARY DRAIN PLACEMENT WITH CHOLANGIOGRAM  11/08/2019  . IR CHOLANGIOGRAM EXISTING TUBE  11/07/2019  . IR EXCHANGE BILIARY DRAIN  10/24/2019  . IR EXCHANGE BILIARY DRAIN  11/12/2019  . IR EXCHANGE BILIARY DRAIN  11/08/2019  . IR GUIDED DRAIN W CATHETER PLACEMENT  11/08/2019  . MOUTH SURGERY    . REVERSE SHOULDER ARTHROPLASTY Right 12/26/2013   Procedure: RIGHT SHOULDER REVERSE ARTHROPLASTY;  Surgeon: Marin Shutter, MD;  Location: [place];  Service: Orthopedics;  Laterality: Right;  . SPHINCTEROTOMY  10/15/2019   Procedure: SPHINCTEROTOMY;  Surgeon: Clarene Essex, MD;  Location: [facility] ENDOSCOPY;  Service: Endoscopy;;  . Bess Kinds CHOLANGIOSCOPY N/A 08/27/2019   Procedure: IDPOEUMP CHOLANGIOSCOPY;  Surgeon: Clarene Essex, MD;  [facility];  Service: Endoscopy;  Laterality: N/A;  . SPYGLASS CHOLANGIOSCOPY N/A 10/15/2019   Procedure:  NTIRWERX CHOLANGIOSCOPY;  Surgeon: Clarene Essex, MD;  Location: [place];  Service: Endoscopy;  Laterality: N/A;  . TOTAL KNEE ARTHROPLASTY Right 04/09/2018   Procedure: RIGHT TOTAL KNEE ARTHROPLASTY;  Surgeon: Gaynelle Arabian, MD;  Location: [facility];  Service: Orthopedics;  Laterality: Right;  Adductor Block    SOCIAL HISTORY: Social History   Socioeconomic History  . Marital status: Single    Spouse name: Not on file  . Number of children: 0  . Years of education: 43  . Highest education level: Not on file  Occupational History  . Not on file  Tobacco Use  . Smoking status: Former  Smoker    Packs/day: 0.50    Years: 10.00    Pack years: 5.00    Types: Cigarettes    Quit date: 12/06/1968    Years since quitting: 51.0  . Smokeless tobacco: Never Used  . Tobacco comment: tried snuff once but "never could get it wet"  Substance and Sexual Activity  . Alcohol use: No    Alcohol/week: 0.0 standard drinks  . Drug use: No  . Sexual activity: Not on file  Other Topics Concern  . Not on file  Social History Narrative   Lives at home by herself.   Right handed.   Caffeine use: 2 glasses tea/day    Social Determinants of Health   Financial Resource Strain:   . Difficulty of Paying Living Expenses:   Food Insecurity:   . Worried About Charity fundraiser in the Last Year:   . Arboriculturist in the Last Year:   Transportation Needs:   . Film/video editor (Medical):   Marland Kitchen Lack of Transportation (Non-Medical):   Physical Activity:   . Days of Exercise per Week:   . Minutes of Exercise per Session:   Stress:   . Feeling of Stress :   Social Connections:   . Frequency of Communication with Friends and Family:   . Frequency of Social Gatherings with Friends and Family:   . Attends Religious Services:   . Active Member of Clubs or Organizations:   . Attends Archivist Meetings:   Marland Kitchen Marital Status:   Intimate Partner Violence:   . Fear of Current or Ex-Partner:    . Emotionally Abused:   Marland Kitchen Physically Abused:   . Sexually Abused:     FAMILY HISTORY: Family History  Problem Relation Age of Onset  . Colon cancer Mother 3  . Heart Problems Mother   . Heart Problems Father   . Cancer Father        skin cancer  . Heart Problems Brother   . Breast cancer Cousin   . Breast cancer Cousin   . Breast cancer Cousin   . Breast cancer Cousin   . Breast cancer Cousin     ALLERGIES:  is allergic to merthiolate [thimerosal] and forteo [parathyroid hormone (recomb)].  MEDICATIONS:  Current Outpatient Medications  Medication Sig Dispense Refill  . diphenhydramine-acetaminophen (TYLENOL PM) 25-500 MG TABS tablet Take 1 tablet by mouth at bedtime as needed.    . furosemide (LASIX) 20 MG tablet Take 20 mg by mouth daily as needed.    . polyethylene glycol (MIRALAX / GLYCOLAX) 17 g packet Take 17 g by mouth daily as needed.    Marland Kitchen aspirin EC 81 MG tablet Take 81 mg by mouth daily.    . Calcium Carbonate-Vitamin D (CALTRATE 600+D) 600-400 MG-UNIT per tablet Take 1 tablet by mouth daily.    . carbidopa-levodopa (SINEMET IR) 25-100 MG tablet TAKE 1 TABLET BY MOUTH THREE TIMES DAILY, TAKE 4 HOURS APART (Patient taking differently: Take 1 tablet by mouth 3 (three) times daily. 0800, 1200, 1600) 270 tablet 1  . feeding supplement, ENSURE ENLIVE, (ENSURE ENLIVE) LIQD Take 237 mLs by mouth 2 (two) times daily between meals. 14220 mL 0  . guaiFENesin (MUCINEX) 600 MG 12 hr tablet Take 600 mg by mouth 2 (two) times daily as needed (congestion).    . metoprolol succinate (TOPROL-XL) 100 MG 24 hr tablet Take 100 mg by mouth daily. Take with or immediately following a meal.    .  Multiple Vitamin (MULTIVITAMIN WITH MINERALS) TABS tablet Take 1 tablet by mouth daily. 30 tablet 0  . Polyethyl Glycol-Propyl Glycol (SYSTANE OP) Place 1 drop into both eyes daily as needed (dry eyes).    . vitamin C (ASCORBIC ACID) 250 MG tablet Take 500 mg by mouth daily.    . Vitamin D,  Ergocalciferol, (DRISDOL) 50000 units CAPS capsule Take 50,000 Units by mouth 2 (two) times a week. Tuesdays and Fridays     No current facility-administered medications for this visit.    PHYSICAL EXAMINATION: ECOG PERFORMANCE STATUS: 3 - Symptomatic, >50% confined to bed  Vitals:   12/12/19 1511  BP: (!) 94/50  Pulse: 63  Resp: 18  Temp: 98.9 F (37.2 C)  SpO2: 100%   Filed Weights   12/12/19 1511  Weight: 153 lb 9.6 oz (69.7 kg)    GENERAL:alert, no distress and comfortable SKIN: skin color, texture, turgor are normal, no rashes or significant lesions EYES: normal, Conjunctiva are pink and non-injected, sclera clear  NECK: supple, thyroid normal size, non-tender, without nodularity LYMPH:  no palpable lymphadenopathy in the cervical, axillary  LUNGS: clear to auscultation and percussion with normal breathing effort HEART: regular rate & rhythm and no murmurs (+) Moderate lower extremity edema ABDOMEN:abdomen soft, non-tender and normal bowel sounds (+) Incisions from draining tubes healed Musculoskeletal:no cyanosis of digits and no clubbing  NEURO: alert & oriented x 3 with fluent speech, no focal motor/sensory deficits EXAM PERFORMED IN WHEELCHAIR   LABORATORY DATA:  I have reviewed the data as listed CBC Latest Ref Rng & Units 11/16/2019 11/14/2019 11/13/2019  WBC 4.0 - 10.5 K/uL 5.7 6.0 6.2  Hemoglobin 12.0 - 15.0 g/dL 10.8(L) 10.0(L) 10.0(L)  Hematocrit 36.0 - 46.0 % 33.9(L) 31.8(L) 31.4(L)  Platelets 150 - 400 K/uL 394 319 297    CMP Latest Ref Rng & Units 11/17/2019 11/16/2019 11/14/2019  Glucose 70 - 99 mg/dL 91 103(H) 100(H)  BUN 8 - 23 mg/dL 7(L) 7(L) 8  Creatinine 0.44 - 1.00 mg/dL 0.70 0.85 0.84  Sodium 135 - 145 mmol/L 140 140 141  Potassium 3.5 - 5.1 mmol/L 3.8 3.0(L) 3.7  Chloride 98 - 111 mmol/L 110 109 114(H)  CO2 22 - 32 mmol/L 24 21(L) 21(L)  Calcium 8.9 - 10.3 mg/dL 8.1(L) 8.2(L) 8.2(L)  Total Protein 6.5 - 8.1 g/dL - 4.6(L) 4.5(L)  Total  Bilirubin 0.3 - 1.2 mg/dL - 3.9(H) 4.3(H)  Alkaline Phos 38 - 126 U/L - 143(H) 149(H)  AST 15 - 41 U/L - 37 43(H)  ALT 0 - 44 U/L - 29 9     RADIOGRAPHIC STUDIES: I have personally reviewed the radiological images as listed and agreed with the findings in the report. No results found.  ASSESSMENT & PLAN:  Lorrie Gargan is a 79 y.o. Caucasian female with a history of Arthritis, HTN, Osteoporosis, Parkinson's Disease   1. Intrahepatic Cholangiocarcinoma -I reviewed her image findings and pathology findings with her and family in great detail. She was seen to have biliary duct dilation due to obstruction and liver lesion. There was multiple attempts for ERCP and stent placement along with cytology for her biliary obstruction, but were unsuccessfully. She underwent successful stent placement and liver hilar mass biopsy at [facility] on 12/03/19. Path showed invasive poorly differentiated carcinoma which is most consistent with cholangiocarcinoma. -I discussed scans so far indicate this is a localized early stage cancer, we need chest images to complete staging. On multiple scan it appears she has benign pancreatic cyst.  I recommend CT chest or PET scan to complete staging. She is agreeable.   -I discussed standard care is surgery which is the only curative treatment option. Given her age, comorbidities, central location of cancer, this is going to be challenge treatment for her to go through. We were discussed in GI tumor conference next week. I will refer her to Dr. Barry Dienes and if needed refer her to surgeon elsewhere.  -I discussed if not eligible for surgery, target Radiation, Y90 Radio Embolization or systemic treatment would be the options to control her disease, but none of this will be curative. Chemo would not be recommended unless it is last few lines of treatment.  -I will review her case in next week's Tumor Board about possible treatments that she is eligible for given her age and comorbidities.    -She notes she is open to consulting with surgeon for more information and currently open to some form of treatment overall such as RT.  -I will test her biopsy sample for MMR and Foundation One to see if she is eligible for target or immunotherapy.    2. Obstructive Jaundice  -She started having Jaundice in late 08/2019. She had high ammonia levels and recent confusion.  -Dr Watt Climes tried to place stent 3 times, IR tried placing stent 3 times. She was able to have adequate  stent placed at [facility] by ERCP on 12/03/19.  -Her jaundice has since improved. Tbili 2.9 on 12/08/19 at [place] home.  -I discussed her confusion can be related to liver failure or her Parkinson's. Will monitor.    3. B/l LE edema, Fatigue -She has B/l edema that has become more moderate now.  -She notes fatigue increase since her initial symptoms.  -She is on Lasix for her edema currently. I recommend she elevate her feet when siting and use compression socks.    4. Parkinson's Disease, Social Support  -She was diagnosed around 2018. This is manage by neurologist, on medication. No current tremors or significant weakness.  -She was managing living alone and ambulated with walker as needed and independently at home.  -Since her recent pain, confusion and fatigue from cancer she has been at [facility] home since 11/18/19.  -She notes get more assistance with walking at nursing home, given she is a fall risk.  -Will monitor her confusion. I recommend she f/u with her Neurologist about this.  -She does not have children but has multiple brothers and good family support. Her sister in law Hailey [name] is her main contact.    5. Comorbidities: Osteoporosis, Easy Bruising, HTN -HTN controlled on Medication  -She has been bruising easily for 20 years. She is on baby aspirin long term.  -She is also on calcium and Vit D.    6. Genetic Testing  -She has 4 cousins from both sides of family who had breast cancer in their  middle age. I discussed she is eligible for genetic testing. She will think about it.    PLAN:  -PET scan or CT Chest in 1-2 weeks  -Discussed her cancer in next week Tumor Board.    Orders Placed This Encounter  Procedures  . NM PET Image Initial (PI) Skull Base To Thigh    Standing Status:   Future    Standing Expiration Date:   12/11/2020    Order Specific Question:   If indicated for the ordered procedure, I authorize the administration of a radiopharmaceutical per Radiology protocol    Answer:  Yes    Order Specific Question:   Preferred imaging location?    Answer:   [facility]    Order Specific Question:   Radiology Contrast Protocol - do NOT remove file path    Answer:   [email or web address]    All questions were answered. The patient knows to call the clinic with any problems, questions or concerns. The total time spent in the appointment was 60 minutes.     Truitt Merle, MD 12/12/2019 10:22 PM  I, Joslyn Devon, am acting as scribe for Truitt Merle, MD.   I have reviewed the above documentation for accuracy and completeness, and I agree with the above.

## 2019-12-12 ENCOUNTER — Encounter: Payer: Self-pay | Admitting: Hematology

## 2019-12-12 ENCOUNTER — Inpatient Hospital Stay: Payer: Medicare Other | Attending: Hematology | Admitting: Hematology

## 2019-12-12 ENCOUNTER — Other Ambulatory Visit: Payer: Self-pay

## 2019-12-12 DIAGNOSIS — Z7982 Long term (current) use of aspirin: Secondary | ICD-10-CM | POA: Insufficient documentation

## 2019-12-12 DIAGNOSIS — G20 Parkinson's disease: Secondary | ICD-10-CM | POA: Insufficient documentation

## 2019-12-12 DIAGNOSIS — Z87891 Personal history of nicotine dependence: Secondary | ICD-10-CM | POA: Insufficient documentation

## 2019-12-12 DIAGNOSIS — C221 Intrahepatic bile duct carcinoma: Secondary | ICD-10-CM | POA: Insufficient documentation

## 2019-12-12 DIAGNOSIS — I10 Essential (primary) hypertension: Secondary | ICD-10-CM | POA: Insufficient documentation

## 2019-12-12 DIAGNOSIS — Z803 Family history of malignant neoplasm of breast: Secondary | ICD-10-CM | POA: Insufficient documentation

## 2019-12-12 NOTE — Progress Notes (Signed)
Met with patient and her sister in law Glenda today at their initial medical oncology appointment with Dr. Burr Medico prior to MD consult.  I explained my role as GI nurse navigator and they were given my card with my direct phone number.  They were encouraged to call with any questions or concerns.

## 2019-12-13 ENCOUNTER — Other Ambulatory Visit: Payer: Self-pay

## 2019-12-13 NOTE — Progress Notes (Signed)
Faxed requisition form to [facility] at fax [phone] for additional testing Foundation One and MMR on pathology exam [id number] dated 12/04/2019.

## 2019-12-16 ENCOUNTER — Ambulatory Visit: Payer: Medicare Other | Admitting: Family Medicine

## 2019-12-16 ENCOUNTER — Other Ambulatory Visit: Payer: Self-pay

## 2019-12-16 ENCOUNTER — Encounter: Payer: Self-pay | Admitting: Hematology

## 2019-12-16 ENCOUNTER — Encounter: Payer: Self-pay | Admitting: Family Medicine

## 2019-12-16 VITALS — BP 98/59 | HR 63 | Ht 65.0 in | Wt 147.0 lb

## 2019-12-16 DIAGNOSIS — G20 Parkinson's disease: Secondary | ICD-10-CM

## 2019-12-16 DIAGNOSIS — R413 Other amnesia: Secondary | ICD-10-CM

## 2019-12-16 DIAGNOSIS — C221 Intrahepatic bile duct carcinoma: Secondary | ICD-10-CM

## 2019-12-16 DIAGNOSIS — R269 Unspecified abnormalities of gait and mobility: Secondary | ICD-10-CM

## 2019-12-16 NOTE — Progress Notes (Addendum)
PATIENT: Hailey [name] DOB: [birth year]  REASON FOR VISIT: follow up HISTORY FROM: patient  Chief Complaint  Patient presents with  . Follow-up    rm 2 here for a f/u parkinsons. Pt is having no new sx. Pts sister in law said the pt is having confusion. Pt was just dx with cancer     HISTORY OF PRESENT ILLNESS: Today 12/16/19 Hailey [name] is a 79 y.o. female here today for follow up for PD. She continues to Sinemet TID. She has had some unfortunate medical problems since being seen.   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 She has had three ERCPs to try to unblock bile duct, last one about 2 weeks ago where two stents were placed. She is followed by Dr Burr Medico, oncology. They are presenting her case this week to determine next steps in plan. She is scheduled for a pet can on 12/25/2019.   She was hospitalized for 12 days on 11/07/2019. She has had significant decline in physical functioning since. She is no longer walking without assistance. She has had two falls. She is having more difficulty with her memory. She was working with PT but this ended last week. She is now able to walk about 75-129ft with her walker and with assistance. She is now followed by Dr Inda Merlin, PCP in the nursing home.  She is requiring assistance with all ADL's.   HISTORY: (copied from my note on 05/14/2019)  Hailey [name] is a 79 y.o. female here today for follow up for PD. She continues Sinemet three times daily at 8am, 12pm and 4pm. She has some pain in right shoulder and right knee (s/p replacements of both joints). She is getting lumbar injections in her back that help with sciatica pain. She lives alone. She is able to do all ADLs independently. She uses a Corporate investment banker. No falls. She feels that memory is stable although it does take her longer to recall information. BP usually runs in 120's/60-70's at home. She has white coat syndrome. Her sister,  brother-in-law and brother help her with needs around the home.  She was referred to social work at [employer] with her last visit.  She states that no one ever reached out to her.  She is not interested in seeking new referral today.  HISTORY: (copied from Dr Cathren Laine note on 09/05/2018)  Interval history 09/05/2018: She feels her tremors are improved at least the internal tremors. She is slowing down. She talks in her sleep but no acting out of dreams. She can try melatonin before bedtime. She is sedentary. Encouraged classes during the day, gave her a list of PD specific classes and will send her to a Education officer, museum at [employer] to meet. Discussed exercise and decreased progression. No falls. No difficulty swallowing. Here with daughter who says she has tried to get patient active and moving and she won;t do it. Had Discussion, exercise is the best way to slow down progression. Called [name], will try to get her to see this PD specific social worker. Also provided information on PD of the [place], exercise classes for PD and support groups. Discussed assisted living and safety in the home. Also make sure family has POA.   Interval history; DAT scan c/w Parkinsonian disorder. Discussed in detail with patient and husband. Discussed medication management. Recommended exercise classes. No falls in the last few weeks. Answered all questions. Discussed parkinson's disease, treatment, they  had multiple questions about Sinemet. Make sure she gets skin checks to eval for skin malignancies. Discussed association between PD and melanoma.   [id number] Hailey [name] a 79 y.o.femalehere as a referral from Dr. Rudean Haskell a new problem Tremors. She was seen in the past in 2016 for dizziness which resolved an MRI of the brain was unremarkable. Here with sister in law. She feels tremulous. Brushing her teeth is difficult because her muscles dont want to work. Sister in law provides much information, everything is  "slow motion", eating is slower, movements are slower overall, walking is slower but no falls. She started notciing symptoms more with BP medications a few years ago, symptoms are stable. She takes very low, speech has decreased in tone. Writing has changed, a little smaller and sloppy. No drooling or wet pillows. She says occasionally she stops when walking and has to get her legs going again and then her legs "unlock". No falls. No dizziness. No significant memory changes, age appropriate things per patient and sister-in-law. No Hallucinations or delusions. She has a resting tremor and action as well. No bvid dreams, but more trouble initiating sleep. She has toruble getting out of chairs but also has knee pain. Smell has worsened, decreased. She denies a resting tremor, her sister in law notices a resting tremor and action tremor. No difficulty with swallowing or choking on food.    Reviewed notes, labs and imaging from outside physicians, which showed:  Review primary care notes, patient has a left hand tremor but somewhat episodic sometimes at rest with inattention lasting 5-10 minutes. No falls. No swallowing difficulty some excess watering of the eyes. This is new, both at rest and with intention currently not symptomatic, also noted flat facies , slowed gait which may be attributable to arthritis and significant knee pain but no shuffling, no cogwheeling, evaluation for atypical Parkinson's.   Labs include see him he which showed BUN 21, creatinine 0.9 otherwise largely unremarkable, CBC unremarkable labs were drawn 12/26/2016.   REVIEW OF SYSTEMS: Out of a complete 14 system review of symptoms, the patient complains only of the following symptoms, and all other reviewed systems are negative.  ALLERGIES: Allergies  Allergen Reactions  . Merthiolate [Thimerosal] Other (See Comments)    Tincture Merthiolate-skin blistering  . Forteo [Parathyroid Hormone (Recomb)] Other (See Comments)    "  strange feeling and I cannot describe how I felt but the Dr took me off of it"    HOME MEDICATIONS: Outpatient Medications Prior to Visit  Medication Sig Dispense Refill  . aspirin EC 81 MG tablet Take 81 mg by mouth daily.    . Calcium Carbonate-Vitamin D (CALTRATE 600+D) 600-400 MG-UNIT per tablet Take 1 tablet by mouth daily.    . carbidopa-levodopa (SINEMET IR) 25-100 MG tablet TAKE 1 TABLET BY MOUTH THREE TIMES DAILY, TAKE 4 HOURS APART (Patient taking differently: Take 1 tablet by mouth 3 (three) times daily. 0800, 1200, 1600) 270 tablet 1  . diphenhydramine-acetaminophen (TYLENOL PM) 25-500 MG TABS tablet Take 1 tablet by mouth at bedtime as needed.    . feeding supplement, ENSURE ENLIVE, (ENSURE ENLIVE) LIQD Take 237 mLs by mouth 2 (two) times daily between meals. 14220 mL 0  . furosemide (LASIX) 20 MG tablet Take 20 mg by mouth daily as needed.    Marland Kitchen guaiFENesin (MUCINEX) 600 MG 12 hr tablet Take 600 mg by mouth 2 (two) times daily as needed (congestion).    . metoprolol succinate (TOPROL-XL) 100 MG 24 hr tablet  Take 100 mg by mouth daily. Take with or immediately following a meal.    . Multiple Vitamin (MULTIVITAMIN WITH MINERALS) TABS tablet Take 1 tablet by mouth daily. 30 tablet 0  . Polyethyl Glycol-Propyl Glycol (SYSTANE OP) Place 1 drop into both eyes daily as needed (dry eyes).    . polyethylene glycol (MIRALAX / GLYCOLAX) 17 g packet Take 17 g by mouth daily as needed.    . vitamin C (ASCORBIC ACID) 250 MG tablet Take 500 mg by mouth daily.    . Vitamin D, Ergocalciferol, (DRISDOL) 50000 units CAPS capsule Take 50,000 Units by mouth 2 (two) times a week. Tuesdays and Fridays     No facility-administered medications prior to visit.    PAST MEDICAL HISTORY: Past Medical History:  Diagnosis Date  . 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 Bilateral leg edema    takes lasix as needed for dependent edema   . H/O exercise stress test    4-5 yrs. ago, told that it was wnl , done  at [place] office   . Headache    MH: Migraines  . Hypertension   . Neuromuscular disorder ([place])   . Osteoporosis   . Parkinson disease ([place])   . Wears glasses     PAST SURGICAL HISTORY: Past Surgical History:  Procedure Laterality Date  . BILIARY BRUSHING  08/27/2019   Procedure: BILIARY BRUSHING;  Surgeon: Clarene Essex, MD;  Location: [facility];  Service: Endoscopy;;  . BILIARY DILATION  11/06/2019   Procedure: BILIARY DILATION;  Surgeon: Clarene Essex, MD;  Location: [facility];  Service: Endoscopy;;  . BILIARY STENT PLACEMENT N/A 11/06/2019   Procedure: BILIARY STENT PLACEMENT;  Surgeon: Clarene Essex, MD;  Location: [facility];  Service: Endoscopy;  Laterality: N/A;  . BIOPSY  08/27/2019   Procedure: BIOPSY;  Surgeon: Clarene Essex, MD;  Location: [facility];  Service: Endoscopy;;  . BREAST CYST EXCISION Left 1994   benign  . cataract surgery Bilateral 2007  . ERCP N/A 08/27/2019   Procedure: ENDOSCOPIC RETROGRADE CHOLANGIOPANCREATOGRAPHY (ERCP);  Surgeon: Clarene Essex, MD;  Location: [facility];  Service: Endoscopy;  Laterality: N/A;  balloon sweep  . ERCP N/A 10/15/2019   Procedure: ENDOSCOPIC RETROGRADE CHOLANGIOPANCREATOGRAPHY (ERCP);  Surgeon: Clarene Essex, MD;  Location: [place];  Service: Endoscopy;  Laterality: N/A;  . ERCP N/A 11/06/2019   Procedure: ENDOSCOPIC RETROGRADE CHOLANGIOPANCREATOGRAPHY (ERCP);  Surgeon: Clarene Essex, MD;  Location: [facility];  Service: Endoscopy;  Laterality: N/A;  . EYE SURGERY     /w IOL  . FOOT TUMOR EXCISION Left 1992   benign  . IR BILIARY DRAIN PLACEMENT WITH CHOLANGIOGRAM  10/18/2019  . IR BILIARY DRAIN PLACEMENT WITH CHOLANGIOGRAM  10/24/2019  . IR BILIARY DRAIN PLACEMENT WITH CHOLANGIOGRAM  11/07/2019  . IR BILIARY DRAIN PLACEMENT WITH CHOLANGIOGRAM  11/08/2019  . IR CHOLANGIOGRAM EXISTING TUBE  11/07/2019  . IR EXCHANGE BILIARY DRAIN  10/24/2019  . IR EXCHANGE BILIARY DRAIN  11/12/2019  . IR EXCHANGE BILIARY DRAIN  11/08/2019  .  IR GUIDED DRAIN W CATHETER PLACEMENT  11/08/2019  . MOUTH SURGERY    . REVERSE SHOULDER ARTHROPLASTY Right 12/26/2013   Procedure: RIGHT SHOULDER REVERSE ARTHROPLASTY;  Surgeon: Marin Shutter, MD;  Location: [place];  Service: Orthopedics;  Laterality: Right;  . SPHINCTEROTOMY  10/15/2019   Procedure: SPHINCTEROTOMY;  Surgeon: Clarene Essex, MD;  Location[facility];  Service: Endoscopy;;  . SPYGLASS CHOLANGIOSCOPY N/A 08/27/2019   Procedure: [id number] CHOLANGIOSCOPY;  Surgeon:  Clarene Essex, MD;  Location: [facility];  Service: Endoscopy;  Laterality: N/A;  . SPYGLASS CHOLANGIOSCOPY N/A 10/15/2019   Procedure: [id number] CHOLANGIOSCOPY;  Surgeon: Clarene Essex, MD;  Location: [place];  Service: Endoscopy;  Laterality: N/A;  . TOTAL KNEE ARTHROPLASTY Right 04/09/2018   Procedure: RIGHT TOTAL KNEE ARTHROPLASTY;  Surgeon: Gaynelle Arabian, MD;  Location: [facility];  Service: Orthopedics;  Laterality: Right;  Adductor Block    FAMILY HISTORY: Family History  Problem Relation Age of Onset  . Colon cancer Mother 60  . Heart Problems Mother   . Heart Problems Father   . Cancer Father        skin cancer  . Heart Problems Brother   . Breast cancer Cousin   . Breast cancer Cousin   . Breast cancer Cousin   . Breast cancer Cousin   . Breast cancer Cousin     SOCIAL HISTORY: Social History   Socioeconomic History  . Marital status: Single    Spouse name: Not on file  . Number of children: 0  . Years of education: 66  . Highest education level: Not on file  Occupational History  . Not on file  Tobacco Use  . Smoking status: Former Smoker    Packs/day: 0.50    Years: 10.00    Pack years: 5.00    Types: Cigarettes    Quit date: 12/06/1968    Years since quitting: 51.0  . Smokeless tobacco: Never Used  . Tobacco comment: tried snuff once but "never could get it wet"  Substance and Sexual Activity  . Alcohol use: No    Alcohol/week: 0.0 standard drinks  . Drug use: No  . Sexual activity: Not  on file  Other Topics Concern  . Not on file  Social History Narrative   Lives at home by herself.   Right handed.   Caffeine use: 2 glasses tea/day    Social Determinants of Health   Financial Resource Strain:   . Difficulty of Paying Living Expenses:   Food Insecurity:   . Worried About Charity fundraiser in the Last Year:   . Arboriculturist in the Last Year:   Transportation Needs:   . Film/video editor (Medical):   Marland Kitchen Lack of Transportation (Non-Medical):   Physical Activity:   . Days of Exercise per Week:   . Minutes of Exercise per Session:   Stress:   . Feeling of Stress :   Social Connections:   . Frequency of Communication with Friends and Family:   . Frequency of Social Gatherings with Friends and Family:   . Attends Religious Services:   . Active Member of Clubs or Organizations:   . Attends Archivist Meetings:   Marland Kitchen Marital Status:   Intimate Partner Violence:   . Fear of Current or Ex-Partner:   . Emotionally Abused:   Marland Kitchen Physically Abused:   . Sexually Abused:       PHYSICAL EXAM  Vitals:   12/16/19 0903  BP: (!) 98/59  Pulse: 63  Weight: 147 lb (66.7 kg)  Height: 5\' 5"  (1.651 m)   Body mass index is 24.46 kg/m.  Generalized: Well developed, in no acute distress, flat affect   Cardiology: normal rate and rhythm, no murmur noted Respiratory: clear to auscultation bilaterally  Neurological examination  Mentation: Alert, oriented to day of week but not month, season or year,  she is oriented to place, and some history taking. Follows some commands speech  and language fluent Cranial nerve II-XII: Pupils were equal round reactive to light. Extraocular movements were full, visual field were full on confrontational test. Facial sensation and strength were normal. Uvula tongue midline. Head turning and shoulder shrug  were normal and symmetric. Motor: The motor testing reveals 5/5 right upper extremity, 4/5left upper and bilateral lower  extremities. Good symmetric motor tone is noted throughout.  Sensory: Sensory testing is intact to soft touch on all 4 extremities. No evidence of extinction is noted.  Coordination: Cerebellar testing reveals reduced finger-nose-finger bilaterally, unable to perform heel-to-shin bilaterally.  Gait and station: Gait not assessed today, patient in wheelchair and does not have walker.      DIAGNOSTIC DATA (LABS, IMAGING, TESTING) - I reviewed patient records, labs, notes, testing and imaging myself where available.  No flowsheet data found.   Lab Results  Component Value Date   WBC 5.7 11/16/2019   HGB 10.8 (L) 11/16/2019   HCT 33.9 (L) 11/16/2019   MCV 99.7 11/16/2019   PLT 394 11/16/2019      Component Value Date/Time   NA 140 11/17/2019 0314   K 3.8 11/17/2019 0314   CL 110 11/17/2019 0314   CO2 24 11/17/2019 0314   GLUCOSE 91 11/17/2019 0314   BUN 7 (L) 11/17/2019 0314   CREATININE 0.70 11/17/2019 0314   CALCIUM 8.1 (L) 11/17/2019 0314   PROT 4.6 (L) 11/16/2019 0820   ALBUMIN 1.8 (L) 11/16/2019 0820   AST 37 11/16/2019 0820   ALT 29 11/16/2019 0820   ALKPHOS 143 (H) 11/16/2019 0820   BILITOT 3.9 (H) 11/16/2019 0820   GFRNONAA >60 11/17/2019 0314   GFRAA >60 11/17/2019 0314   No results found for: CHOL, HDL, LDLCALC, LDLDIRECT, TRIG, CHOLHDL No results found for: HGBA1C No results found for: VITAMINB12 No results found for: TSH   ASSESSMENT AND PLAN 79 y.o. year old female  has a past medical history of Arthritis, Bilateral leg edema, H/O exercise stress test, Headache, Hypertension, Neuromuscular disorder ([place]), Osteoporosis, Parkinson disease ([place]), and Wears glasses. here with     ICD-10-CM   1. PD (Parkinson's disease) ([place])  G20   2. 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 Memory loss  R41.3     Atara has had some unfortunate medical diagnoses since last being seen. She is currently awaiting a plan for treatment of  recently diagnosed liver cancer. She suffered from metabolic encephalopathy a few weeks ago while admitted for 12 days. She has recently moved to a nursing facility and requiring assistance with all ADL's. Fortunately, she seems to be making some progress. She is walking with her walker and 1 person assist. She continues to have short term memory loss and intermittent confusion with questionable hallucinations. We have discussed delirium versus dementia and the role of metabolic encephalopathy in cognition. 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 We were unable to complete MMSE today as she is unable to follow some commands. She is oriented to place and most history taking but does exhibit periods of confusion and possible hallucinations as she is reaching for something in the air with finger to nose testing. We have discussed considering adding Aricept or Namenda. We will have her family discuss this option with new PCP and oncology pending oncology treatment plan. I have provided educational materials on dementia and information on Aricept for Britton and her family to review. She was encouraged to stay as active both physically  and mentally as she can do safely.    No orders of the defined types were placed in this encounter.    No orders of the defined types were placed in this encounter.     I spent 45 minutes with the patient. 50% of this time was spent counseling and educating patient on plan of care and medications.     Debbora Presto, FNP-C 12/16/2019, 10:23 AM [facility] [address], [place], [place] [address] [phone]  Made any corrections needed, and agree with history, physical, neuro exam,assessment and plan as stated.     Sarina Ill, MD [facility]

## 2019-12-16 NOTE — Patient Instructions (Signed)
We will continue Sinemet three times daily. We will consider adding Aricept pending next steps in care after speaking with Dr Inda Merlin and Dr Burr Medico.   Stay well hydrated, well balanced diet and get plenty of mental and physical exercise.   Follow up with me in 3 months    Memory Compensation Strategies  1. Use "WARM" strategy.  W= write it down  A= associate it  R= repeat it  M= make a mental note  2.   You can keep a Social worker.  Use a 3-ring notebook with sections for the following: calendar, important names and phone numbers,  medications, doctors' names/phone numbers, lists/reminders, and a section to journal what you did  each day.   3.    Use a calendar to write appointments down.  4.    Write yourself a schedule for the day.  This can be placed on the calendar or in a separate section of the Memory Notebook.  Keeping a  regular schedule can help memory.  5.    Use medication organizer with sections for each day or morning/evening pills.  You may need help loading it  6.    Keep a basket, or pegboard by the door.  Place items that you need to take out with you in the basket or on the pegboard.  You may also want to  include a message board for reminders.  7.    Use sticky notes.  Place sticky notes with reminders in a place where the task is performed.  For example: " turn off the  stove" placed by the stove, "lock the door" placed on the door at eye level, " take your medications" on  the bathroom mirror or by the place where you normally take your medications.  8.    Use alarms/timers.  Use while cooking to remind yourself to check on food or as a reminder to take your medicine, or as a  reminder to make a call, or as a reminder to perform another task, etc.   Donepezil tablets What is this medicine? DONEPEZIL (doe NEP e zil) is used to treat mild to moderate dementia caused by Alzheimer's disease. This medicine may be used for other purposes; ask your health care  provider or pharmacist if you have questions. COMMON BRAND NAME(S): Aricept What should I tell my health care provider before I take this medicine? They need to know if you have any of these conditions:  asthma or other lung disease  difficulty passing urine  head injury  heart disease  history of irregular heartbeat  liver disease  seizures (convulsions)  stomach or intestinal disease, ulcers or stomach bleeding  an unusual or allergic reaction to donepezil, other medicines, foods, dyes, or preservatives  pregnant or trying to get pregnant  breast-feeding How should I use this medicine? Take this medicine by mouth with a glass of water. Follow the directions on the prescription label. You may take this medicine with or without food. Take this medicine at regular intervals. This medicine is usually taken before bedtime. Do not take it more often than directed. Continue to take your medicine even if you feel better. Do not stop taking except on your doctor's advice. If you are taking the 23 mg donepezil tablet, swallow it whole; do not cut, crush, or chew it. Talk to your pediatrician regarding the use of this medicine in children. Special care may be needed. Overdosage: If you think you have taken too much of  this medicine contact a poison control center or emergency room at once. NOTE: This medicine is only for you. Do not share this medicine with others. What if I miss a dose? If you miss a dose, take it as soon as you can. If it is almost time for your next dose, take only that dose, do not take double or extra doses. What may interact with this medicine? Do not take this medicine with any of the following medications:  certain medicines for fungal infections like itraconazole, fluconazole, posaconazole, and voriconazole  cisapride  dextromethorphan; quinidine  dronedarone  pimozide  quinidine  thioridazine This medicine may also interact with the following  medications:  antihistamines for allergy, cough and cold  atropine  bethanechol  carbamazepine  certain medicines for bladder problems like oxybutynin, tolterodine  certain medicines for Parkinson's disease like benztropine, trihexyphenidyl  certain medicines for stomach problems like dicyclomine, hyoscyamine  certain medicines for travel sickness like scopolamine  dexamethasone  dofetilide  ipratropium  NSAIDs, medicines for pain and inflammation, like ibuprofen or naproxen  other medicines for Alzheimer's disease  other medicines that prolong the QT interval (cause an abnormal heart rhythm)  phenobarbital  phenytoin  rifampin, rifabutin or rifapentine  ziprasidone This list may not describe all possible interactions. Give your health care provider a list of all the medicines, herbs, non-prescription drugs, or dietary supplements you use. Also tell them if you smoke, drink alcohol, or use illegal drugs. Some items may interact with your medicine. What should I watch for while using this medicine? Visit your doctor or health care professional for regular checks on your progress. Check with your doctor or health care professional if your symptoms do not get better or if they get worse. You may get drowsy or dizzy. Do not drive, use machinery, or do anything that needs mental alertness until you know how this drug affects you. What side effects may I notice from receiving this medicine? Side effects that you should report to your doctor or health care professional as soon as possible:  allergic reactions like skin rash, itching or hives, swelling of the face, lips, or tongue  feeling faint or lightheaded, falls  loss of bladder control  seizures  signs and symptoms of a dangerous change in heartbeat or heart rhythm like chest pain; dizziness; fast or irregular heartbeat; palpitations; feeling faint or lightheaded, falls; breathing problems  signs and symptoms of  infection like fever or chills; cough; sore throat; pain or trouble passing urine  signs and symptoms of liver injury like dark yellow or brown urine; general ill feeling or flu-like symptoms; light-colored stools; loss of appetite; nausea; right upper belly pain; unusually weak or tired; yellowing of the eyes or skin  slow heartbeat or palpitations  unusual bleeding or bruising  vomiting Side effects that usually do not require medical attention (report to your doctor or health care professional if they continue or are bothersome):  diarrhea, especially when starting treatment  headache  loss of appetite  muscle cramps  nausea  stomach upset This list may not describe all possible side effects. Call your doctor for medical advice about side effects. You may report side effects to FDA at 1-800-FDA-1088. Where should I keep my medicine? Keep out of reach of children. Store at room temperature between 15 and 30 degrees C (59 and 86 degrees F). Throw away any unused medicine after the expiration date. NOTE: This sheet is a summary. It may not cover all possible information. If  you have questions about this medicine, talk to your doctor, pharmacist, or health care provider.  2020 Elsevier/Gold Standard (2018-07-02 10:33:41)   Delirium Delirium is a state of mental confusion. It comes on quickly and causes significant changes in a person's thinking and behavior. People with delirium usually have trouble paying attention to what is going on or knowing where they are. They may become very withdrawn or very emotional and unable to sit still. They may even see or feel things that are not there (hallucinations). Delirium is a sign of a serious underlying medical condition. What are the causes? Delirium occurs when something suddenly affects the signals that the brain sends out. Brain signals can be affected by anything that puts severe stress on the body and brain and causes brain chemicals  to be out of balance. The most common causes of delirium include:  Infections. These may be bacterial, viral, fungal, or protozoal.  Medicines. These include many over-the-counter and prescription medicines.  Recreational drugs.  Substance withdrawal. This occurs with sudden discontinuation of alcohol, certain medicines, or recreational drugs.  Surgery and anesthesia.  Sudden vascular events, such as stroke and brain hemorrhage.  Other brain disorders, such as migraines, tumors, seizures, and physical head trauma.  Metabolic disorders, such as kidney or liver failure.  Low blood oxygen (anoxia). This may occur with lung disease, cardiac arrest, or carbon monoxide poisoning.  Hormone imbalances (endocrinopathies), such as an overactive thyroid (hyperthyroidism) or underactive thyroid (hypothyroidism).  Vitamin deficiencies. What increases the risk? The following factors may make someone more likely to develop this condition.  Being a child.  Being an older person.  Living alone.  Having vision loss or hearing loss.  Having an existing brain disease, such as dementia.  Having long-lasting (chronic) medical conditions, such as heart disease.  Being hospitalized for long periods of time. What are the signs or symptoms? Delirium starts with a sudden change in a person's thinking or behavior. Symptoms include:  Not being able to stay awake (drowsiness) or pay attention.  Being confused about places, time, and people.  Forgetfulness.  Having extreme energy levels. These may be low or high.  Changes in sleep patterns.  Extreme mood swings, such as sudden anger or anxiety.  Focusing on things or ideas that are not important.  Rambling and senseless talking.  Difficulty speaking, understanding speech, or both.  Hallucinations.  Tremor or unsteady gait. Symptoms come and go (fluctuate) over time, and they are often worse at the end of the day. How is this  diagnosed? People with delirium may not realize that they have the condition. Often, a family member or health care provider is the first person to notice the changes. This condition may be diagnosed based on a physical exam, health history, and tests.  The health care provider will obtain a detailed history. This may include questions about: ? Current symptoms. ? Medical issues. ? Medicines. ? Recreational drug use.  The health care provider will perform a mental status examination by: ? Asking questions to check for confusion. ? Watching for abnormal behavior.  The health care provider may also order lab tests or additional studies to determine the cause of the delirium. How is this treated? Treatment of delirium depends on the cause and severity. Delirium usually goes away within days or weeks of treating the underlying cause. In the meantime, do not leave the person alone because he or she may accidentally cause self-harm. This condition may be treated with supportive care, such  as:  Increased light during the day and decreased light at night.  Low noise level.  Uninterrupted sleep.  A regular daily schedule.  Clocks and calendars to help with orientation.  Familiar objects, including the person's pictures and clothing.  Frequent visits from familiar family and friends.  A healthy diet.  Gentle exercise. In more severe cases of delirium, medicine may be prescribed to help the person keep calm and think more clearly. Follow these instructions at home:  Continue supportive care as told by a health care provider.  Over-the-counter and prescription medicines should be taken only as told by a health care provider.  Ask a health care provider before using herbs or supplements.  Do not use alcohol or recreational drugs.  Keep all follow-up visits as told by a health care provider. This is important. Contact a health care provider if:  Symptoms do not get better or they  become worse.  New symptoms of delirium develop.  Caring for the person at home does not seem safe.  Eating, drinking, or communicating stops.  There are side effects of medicines, such as changes in sleep patterns, dizziness, weight gain, restlessness, movement changes, or tremors. Get help right away if:  Serious thoughts occur about self-harm or about hurting others.  There are serious side effects of medicine, such as: ? Swelling of the face, lips, tongue, or throat. ? Fever, confusion, muscle spasms, or seizures. Summary  Delirium is a state of mental confusion. It comes on quickly and causes significant changes in a person's thinking and behavior.  Delirium is a sign of a serious underlying medical condition.  Certain medical conditions or a long hospital stay may increase the risk of developing delirium.  Treatment of delirium involves treating the underlying cause and providing supportive treatments, such as a calm and familiar environment. This information is not intended to replace advice given to you by your health care provider. Make sure you discuss any questions you have with your health care provider. Document Revised: 03/01/2018 Document Reviewed: 03/01/2018 Elsevier Patient Education  [place].   Dementia Caregiver Guide Dementia is a term used to describe a number of symptoms that affect memory and thinking. The most common symptoms include:  Memory loss.  Trouble with language and communication.  Trouble concentrating.  Poor judgment.  Problems with reasoning.  Child-like behavior and language.  Extreme anxiety.  Angry outbursts.  Wandering from home or public places. Dementia usually gets worse slowly over time. In the early stages, people with dementia can stay independent and safe with some help. In later stages, they need help with daily tasks such as dressing, grooming, and using the bathroom. How to help the person with dementia  cope Dementia can be frightening and confusing. Here are some tips to help the person with dementia cope with changes caused by the disease. General tips  Keep the person on track with his or her routine.  Try to identify areas where the person may need help.  Be supportive, patient, calm, and encouraging.  Gently remind the person that adjusting to changes takes time.  Help with the tasks that the person has asked for help with.  Keep the person involved in daily tasks and decisions as much as possible.  Encourage conversation, but try not to get frustrated or harried if the person struggles to find words or does not seem to appreciate your help. Communication tips  When the person is talking or seems frustrated, make eye contact and  hold the person's hand.  Ask specific questions that need yes or no answers.  Use simple words, short sentences, and a calm voice. Only give one direction at a time.  When offering choices, limit them to just 1 or 2.  Avoid correcting the person in a negative way.  If the person is struggling to find the right words, gently try to help him or her. How to recognize symptoms of stress Symptoms of stress in caregivers include:  Feeling frustrated or angry with the person with dementia.  Denying that the person has dementia or that his or her symptoms will not improve.  Feeling hopeless and unappreciated.  Difficulty sleeping.  Difficulty concentrating.  Feeling anxious, irritable, or depressed.  Developing stress-related health problems.  Feeling like you have too little time for your own life. Follow these instructions at home:   Make sure that you and the person you are caring for: ? Get regular sleep. ? Exercise regularly. ? Eat regular, nutritious meals. ? Drink enough fluid to keep your urine clear or pale yellow. ? Take over-the-counter and prescription medicines only as told by your health care providers. ? Attend all  scheduled health care appointments.  Join a support group with others who are caregivers.  Ask about respite care resources so that you can have a regular break from the stress of caregiving.  Look for signs of stress in yourself and in the person you are caring for. If you notice signs of stress, take steps to manage it.  Consider any safety risks and take steps to avoid them.  Organize medications in a pill box for each day of the week.  Create a plan to handle any legal or financial matters. Get legal or financial advice if needed.  Keep a calendar in a central location to remind the person of appointments or other activities. Tips for reducing the risk of injury  Keep floors clear of clutter. Remove rugs, magazine racks, and floor lamps.  Keep hallways well lit, especially at night.  Put a handrail and nonslip mat in the bathtub or shower.  Put childproof locks on cabinets that contain dangerous items, such as medicines, alcohol, guns, toxic cleaning items, sharp tools or utensils, matches, and lighters.  Put the locks in places where the person cannot see or reach them easily. This will help ensure that the person does not wander out of the house and get lost.  Be prepared for emergencies. Keep a list of emergency phone numbers and addresses in a convenient area.  Remove car keys and lock garage doors so that the person does not try to get in the car and drive.  Have the person wear a bracelet that tracks locations and identifies the person as having memory problems. This should be worn at all times for safety. Where to find support: Many individuals and organizations offer support. These include:  Support groups for people with dementia and for caregivers.  Counselors or therapists.  Home health care services.  Adult day care centers. Where to find more information Alzheimer's Association: [employer] Contact a health care provider if:  The person's health is  rapidly getting worse.  You are no longer able to care for the person.  Caring for the person is affecting your physical and emotional health.  The person threatens himself or herself, you, or anyone else. Summary  Dementia is a term used to describe a number of symptoms that affect memory and thinking.  Dementia usually gets worse  slowly over time.  Take steps to reduce the person's risk of injury, and to plan for future care.  Caregivers need support, relief from caregiving, and time for their own lives. This information is not intended to replace advice given to you by your health care provider. Make sure you discuss any questions you have with your health care provider. Document Revised: 06/23/2017 Document Reviewed: 06/14/2016 Elsevier Patient Education  [place] Disease Parkinson's disease causes problems with movements. It is a long-term condition. It gets worse over time (is progressive). It affects each person in different ways. It makes it harder for you to:  Control how your body moves.  Move your body normally. The condition can range from mild to very bad (advanced). What are the causes? This condition results from a loss of brain cells called neurons. These brain cells make a chemical called dopamine, which is needed to control body movement. As the condition gets worse, the brain cells make less dopamine. This makes it hard to move or control your movements. The exact cause of this condition is not known. What increases the risk?  Being female.  Being age 25 or older.  Having family members who had Parkinson's disease.  Having had an injury to the brain.  Being very sad (depressed).  Being around things that are harmful or poisonous. What are the signs or symptoms? Symptoms of this condition can vary. The main symptoms have to do with movement. These include:  A tremor or shaking while you are resting that you cannot  control.  Stiffness in your neck, arms, and legs.  Slowing of movement. This may include: ? Losing expressions of the face. ? Having trouble making small movements that are needed to button your clothing or brush your teeth.  Walking in a way that is not normal. You may walk with short, shuffling steps.  Loss of balance when standing. You may sway, fall backward, or have trouble making turns. Other symptoms include:  Being very sad, worried, or confused.  Seeing or hearing things that are not real.  Losing thinking abilities (dementia).  Trouble speaking or swallowing.  Having a hard time pooping (constipation).  Needing to pee right away, peeing often, or not being able to control when you pee or poop.  Sleep problems. How is this treated? There is no cure. The goal of treatment is to manage your symptoms. Treatment may include:  Medicines.  Therapy to help with talking or movement.  Surgery to reduce shaking and other movements that you cannot control. Follow these instructions at home: Medicines  Take over-the-counter and prescription medicines only as told by your doctor.  Avoid taking pain or sleeping medicines. Eating and drinking  Follow instructions from your doctor about what you cannot eat or drink.  Do not drink alcohol. Activity  Talk with your doctor about if it is safe for you to drive.  Do exercises as told by your doctor. Lifestyle      Put in grab bars and railings in your home. These help to prevent falls.  Do not use any products that contain nicotine or tobacco, such as cigarettes, e-cigarettes, and chewing tobacco. If you need help quitting, ask your doctor.  Join a support group. General instructions  Talk with your doctor about what you need help with and what your safety needs are.  Keep all follow-up visits as told by your doctor, including any therapy visits to help with talking or moving. This  is important. Contact a doctor  if:  Medicines do not help your symptoms.  You feel off-balance.  You fall at home.  You need more help at home.  You have trouble swallowing.  You have a very hard time pooping.  You have a lot of side effects from your medicines.  You feel very sad, worried, or confused. Get help right away if:  You were hurt in a fall.  You see or hear things that are not real.  You cannot swallow without choking.  You have chest pain or trouble breathing.  You do not feel safe at home.  You have thoughts about hurting yourself or others. If you ever feel like you may hurt yourself or others, or have thoughts about taking your own life, get help right away. You can go to your nearest emergency department or call:  Your local emergency services (911 in the [place]  A suicide crisis helpline, such as the [facility] at 947-176-8964. This is open 24 hours a day. Summary  This condition causes problems with movements.  It is a long-term condition. It gets worse over time.  There is no cure. Treatment focuses on managing your symptoms.  Talk with your doctor about what you need help with and what your safety needs are.  Keep all follow-up visits as told by your doctor. This is important. This information is not intended to replace advice given to you by your health care provider. Make sure you discuss any questions you have with your health care provider. Document Revised: 09/27/2018 Document Reviewed: 09/27/2018 Elsevier Patient Education  [place].

## 2019-12-18 ENCOUNTER — Other Ambulatory Visit: Payer: Self-pay

## 2019-12-18 NOTE — Progress Notes (Signed)
Spoke with patient's sister in law Sher[place] regarding outcome of Tumor Board today.  I explained recommendation is to refer to Dr. Cloyd Stagers at [facility] for surgical consult.  She states that she is having increased confusion, talking to people who aren't there.  They are checking her for possible UTI but she thinks this is the worse she has ever been when it comes to confusion.  She desires to wait until after she has PET scan next week before a referral is made to [facility].  I have made Dr. Burr Medico aware.

## 2019-12-25 ENCOUNTER — Encounter (HOSPITAL_COMMUNITY)
Admission: RE | Admit: 2019-12-25 | Discharge: 2019-12-25 | Disposition: A | Discharge status: Home / Self Care | Payer: Medicare Other | Source: Ambulatory Visit | Attending: Hematology | Admitting: Hematology

## 2019-12-25 DIAGNOSIS — K862 Cyst of pancreas: Secondary | ICD-10-CM | POA: Diagnosis not present

## 2019-12-25 DIAGNOSIS — K4090 Unilateral inguinal hernia, without obstruction or gangrene, not specified as recurrent: Secondary | ICD-10-CM | POA: Insufficient documentation

## 2019-12-25 DIAGNOSIS — I700 Atherosclerosis of aorta: Secondary | ICD-10-CM | POA: Insufficient documentation

## 2019-12-25 DIAGNOSIS — C221 Intrahepatic bile duct carcinoma: Secondary | ICD-10-CM | POA: Diagnosis present

## 2019-12-25 DIAGNOSIS — I2510 Atherosclerotic heart disease of native coronary artery without angina pectoris: Secondary | ICD-10-CM | POA: Insufficient documentation

## 2019-12-25 LAB — GLUCOSE, CAPILLARY: Glucose-Capillary: 83 mg/dL (ref 70–99)

## 2019-12-25 MED ORDER — FLUDEOXYGLUCOSE F - 18 (FDG) INJECTION
7.3500 | Freq: Once | INTRAVENOUS | Status: AC | PRN
  Administered 2019-12-25: 7.35 via INTRAVENOUS

## 2019-12-27 ENCOUNTER — Telehealth: Payer: Self-pay | Admitting: Hematology

## 2019-12-27 NOTE — Telephone Encounter (Signed)
I called the patient's sister-in-law, and reviewed her PET scan findings with her.  PET scan showed no evidence of notable patient has deteriorated further in the past few weeks, with intermittent confusion. She was recently seen by her neurologist, was started on Aricept. She is very weak.   We discussed the option of second surgical opinion at [facility], versus palliative radiation..  I am concerned she may not be a candidate for liver resection due to her advanced age and overall poor health condition.  Her sister-in-law shared the same thoughts.  She will discuss with patient over the weekend, and let us know her decision.   Truitt Merle  12/27/2019

## 2019-12-30 ENCOUNTER — Telehealth: Payer: Self-pay | Admitting: Family Medicine

## 2019-12-30 NOTE — Telephone Encounter (Signed)
Spoke to Sabin, let her know that response in [place].  She will read it and ask questions if needed.

## 2019-12-30 NOTE — Telephone Encounter (Signed)
Glenda, sister in law and on [place] for the pt called stating she is needing to discuss the pt's medications with provider. Please advise.

## 2019-12-30 NOTE — Telephone Encounter (Signed)
I called sister back she has a lot of questions.  Pt is confused, having hallucinations and delusions (non threatening).  Dr Inda Merlin started on namenda 10mg  po bid.  (does not seem to make difference).  Also ok to give haldol? To PD pts.  This was done as a prn drug pre Dr. Inda Merlin.  (she read in google not to give to PD pts).  Next, had pet scan (in [place]),  No surgeon in [place] to receommend doing surgery.  Possible referral to [facility] (she is asking your opinion in mind of pts PD/Dementia (would be something to consider/helpful to have done)?.  Lastly, is pts PD at a stage that dementia would be a natural happening, or is dementia a separate issue?

## 2019-12-30 NOTE — Telephone Encounter (Signed)
I am so very sorry to hear that she is having more difficulty since being seen. Unfortunately, folks with PD can have worsening symptoms and cognitive decline. Especially in the setting of another life changing diagnosis like cancer. It looks like they are not wanting to perform any more surgeries at this time? I would not rule out seeing a specialist at [facility] for a second opinion as she was doing quite well a few months ago. It is so difficulty to know how she may do. Progression of disease can be gradual or very quick and it is so random as to who is effected in what way. Namenda is a good memory medication. It can help with hallucinations in PD. Haldol is sometimes used in folks who are bothered by hallucinations or unable to settle down. It is a medication that we have to be careful with but is used frequently. There are others like Seroquel and a newer medication specific PD psychosis but it is sometimes hard to get. I would have them discuss goals of care with PCP and oncology. It is really up to them on how aggressive to be. I don't think there is a right or wrong answer. I am happy to see her back anytime she needs [place].

## 2020-01-02 ENCOUNTER — Telehealth: Payer: Self-pay

## 2020-01-02 NOTE — Telephone Encounter (Signed)
Spoke with Holley Raring - patient's sister in law and she states they spoke with the patient on a day when she was clear minded and she does not want surgery/no second opinion at [facility].  They are going to discuss RT with her and will call me back and let me know what she says.  She is having more days with increased confusion, seeing people who aren't there, talking to people who aren't there.  They are trying to make the best decision as a family. She remains at [facility].

## 2020-01-03 ENCOUNTER — Telehealth: Payer: Self-pay

## 2020-01-03 NOTE — Telephone Encounter (Signed)
Patient's sister in law Glenda calls back today to let us know that patient would like a consult with Dr. Lisbeth Renshaw to discuss possible radiation.

## 2020-01-04 ENCOUNTER — Other Ambulatory Visit: Payer: Self-pay | Admitting: Hematology

## 2020-01-04 DIAGNOSIS — C221 Intrahepatic bile duct carcinoma: Secondary | ICD-10-CM

## 2020-01-04 NOTE — Telephone Encounter (Signed)
I will place a referral to Dr. Lisbeth Renshaw. Thanks   Truitt Merle MD

## 2020-01-09 ENCOUNTER — Ambulatory Visit (HOSPITAL_COMMUNITY): Payer: Medicare Other

## 2020-01-14 ENCOUNTER — Telehealth: Payer: Self-pay | Admitting: Radiation Oncology

## 2020-01-14 ENCOUNTER — Ambulatory Visit: Payer: Medicare Other | Attending: Radiation Oncology

## 2020-01-14 ENCOUNTER — Ambulatory Visit: Admission: RE | Admit: 2020-01-14 | Payer: Medicare Other | Source: Ambulatory Visit | Admitting: Radiation Oncology

## 2020-01-14 NOTE — Telephone Encounter (Signed)
Hailey [name] decided to wait until Hailey [name] was feeling better to call back and reschedule with Dr. Lisbeth Renshaw. I confirmed the number she was to call [phone] and speak with scheduling in [place].

## 2020-02-05 NOTE — Progress Notes (Signed)
I received a call from her sister in law.  Patient has been very sick first with pneumonia and now with UTI.  She also is have severe cognitive problems, seeing people and things that aren't there.  This was going on somewhat prior to her recent illness. They want to cancel her consult on 7/20 and once she is better they will be back in touch with our office to potentially reschedule.    I have sent a schedule message to Radiation Oncology to cancel her appointments on 7/20 and made Dr. Burr Medico aware.

## 2020-02-11 ENCOUNTER — Ambulatory Visit: Payer: Medicare Other | Admitting: Radiation Oncology

## 2020-02-11 ENCOUNTER — Ambulatory Visit: Payer: Medicare Other

## 2020-03-16 NOTE — Patient Instructions (Signed)
Memory Compensation Strategies  1. Use "WARM" strategy.  W= write it down  A= associate it  R= repeat it  M= make a mental note  2.   You can keep a Social worker.  Use a 3-ring notebook with sections for the following: calendar, important names and phone numbers,  medications, doctors' names/phone numbers, lists/reminders, and a section to journal what you did  each day.   3.    Use a calendar to write appointments down.  4.    Write yourself a schedule for the day.  This can be placed on the calendar or in a separate section of the Memory Notebook.  Keeping a  regular schedule can help memory.  5.    Use medication organizer with sections for each day or morning/evening pills.  You may need help loading it  6.    Keep a basket, or pegboard by the door.  Place items that you need to take out with you in the basket or on the pegboard.  You may also want to  include a message board for reminders.  7.    Use sticky notes.  Place sticky notes with reminders in a place where the task is performed.  For example: " turn off the  stove" placed by the stove, "lock the door" placed on the door at eye level, " take your medications" on  the bathroom mirror or by the place where you normally take your medications.  8.    Use alarms/timers.  Use while cooking to remind yourself to check on food or as a reminder to take your medicine, or as a  reminder to make a call, or as a reminder to perform another task, etc.   Fall Prevention in [place], Adult Being a patient in the hospital puts you at risk for falling. Falls can cause serious injury and harm, but they can be prevented. It is important to understand what puts you at risk for falling and what you and your health care team can do to prevent you from falling. If you or a loved one falls at the hospital, it is important to tell hospital staff about it. What increases the risk for falls? Certain conditions and treatments may increase  your risk of falling in the hospital. These include:  Being in an unfamiliar environment, especially when using the bathroom at night.  Having surgery.  Being on bed rest.  Taking many medicines or certain types of medicines, such as sleeping pills.  Having tubes in place, such as IV lines or catheters. Other risk factors for falls in a hospital include:  Having difficulty with hearing or vision.  Having a change in thinking or behavior, such as confusion.  Having depression.  Having trouble with balance.  Being a female.  Feeling dizzy.  Needing to use the toilet frequently.  Having fallen during the past three months.  Having low blood pressure. What are some strategies for preventing falls? If you or a loved one has to stay in the hospital:  Ask about which fall prevention strategies will be in place. Do not hesitate to speak up if you notice that the fall prevention plan has changed.  Ask for help moving around, especially after surgery or when feeling unwell.  If you have been asked to call for help when getting up, do not get up by yourself. Asking for help with getting up is for your safety, and the staff is there to help  you.  Wear nonskid footwear.  Get up slowly, and sit at the side of the bed for a few minutes before standing up.  Keep items you need, such as the nurse call button or a phone, close to you so that you do not need to reach for them.  Wear eyeglasses or hearing aids if you have them.  Have someone stay in the hospital with you or your loved one.  Ask if sleeping pills or other medicines that can cause confusion are necessary. What does the hospital staff do to help prevent falls? Hospitals have systems in place to prevent falls and accidents, which may involve:  Discussing your fall risks and making a personalized fall prevention plan.  Checking in regularly to see if you need help.  Placing an arm band on your wrist or a sign near your  room to alert other staff of your needs.  Using an alarm on your hospital bed. This is an alarm that goes off if you get out of bed and forget to call for help.  Keeping the bed in a low and locked position.  Keeping the area around the bed and bathroom well-lit and free from clutter.  Keeping your room quiet, so that you can sleep and be well-rested.  Using safety equipment, such as: ? A belt around your waist. ? Walkers, crutches, and other devices for support. ? Safety beds, such as low beds or cushions on the floor next to the bed.  Having a staff person stay with you (one-on-one observation), even when you are using the bathroom. This is for your safety.  Using video monitoring. This allows a staff member to come to help you if you need help. What other actions can I take to lower my risk of falls?  Check in regularly with your health care provider or pharmacist to review all of the medicines that you take.  Make sure that you have a regular exercise program to stay fit. This will help you maintain your balance.  Talk with a physical therapist or trainer if recommended by your health care provider. They can help you to improve your strength, balance, and endurance.  If you are over age 60: ? Ask your health care provider if you need a calcium or vitamin D supplement. ? Have your eyes and hearing checked every year. ? Have your feet checked every year. Where to find more information You can find more information about fall prevention from the Centers for Disease Control and Prevention: ImproveLook.cz Summary  Being in an unfamiliar environment, such as the hospital, increases your risk for falling.  If you have been asked to call for help when getting up, do not get up by yourself. Asking for help with getting up is for your safety, and the staff is there to help you.  Ask about which fall prevention strategies will be in place. Do not hesitate to speak up if you notice  that the fall prevention plan has changed.  If you or a loved one falls, tell the hospital staff. This is important. This information is not intended to replace advice given to you by your health care provider. Make sure you discuss any questions you have with your health care provider. Document Revised: 06/23/2017 Document Reviewed: 02/22/2017 Elsevier Patient Education  [place] Disease Parkinson's disease causes problems with movements. It is a long-term condition. It gets worse over time (is progressive). It affects each person in different ways.  It makes it harder for you to:  Control how your body moves.  Move your body normally. The condition can range from mild to very bad (advanced). What are the causes? This condition results from a loss of brain cells called neurons. These brain cells make a chemical called dopamine, which is needed to control body movement. As the condition gets worse, the brain cells make less dopamine. This makes it hard to move or control your movements. The exact cause of this condition is not known. What increases the risk?  Being female.  Being age 26 or older.  Having family members who had Parkinson's disease.  Having had an injury to the brain.  Being very sad (depressed).  Being around things that are harmful or poisonous. What are the signs or symptoms? Symptoms of this condition can vary. The main symptoms have to do with movement. These include:  A tremor or shaking while you are resting that you cannot control.  Stiffness in your neck, arms, and legs.  Slowing of movement. This may include: ? Losing expressions of the face. ? Having trouble making small movements that are needed to button your clothing or brush your teeth.  Walking in a way that is not normal. You may walk with short, shuffling steps.  Loss of balance when standing. You may sway, fall backward, or have trouble making turns. Other symptoms  include:  Being very sad, worried, or confused.  Seeing or hearing things that are not real.  Losing thinking abilities (dementia).  Trouble speaking or swallowing.  Having a hard time pooping (constipation).  Needing to pee right away, peeing often, or not being able to control when you pee or poop.  Sleep problems. How is this treated? There is no cure. The goal of treatment is to manage your symptoms. Treatment may include:  Medicines.  Therapy to help with talking or movement.  Surgery to reduce shaking and other movements that you cannot control. Follow these instructions at home: Medicines  Take over-the-counter and prescription medicines only as told by your doctor.  Avoid taking pain or sleeping medicines. Eating and drinking  Follow instructions from your doctor about what you cannot eat or drink.  Do not drink alcohol. Activity  Talk with your doctor about if it is safe for you to drive.  Do exercises as told by your doctor. Lifestyle      Put in grab bars and railings in your home. These help to prevent falls.  Do not use any products that contain nicotine or tobacco, such as cigarettes, e-cigarettes, and chewing tobacco. If you need help quitting, ask your doctor.  Join a support group. General instructions  Talk with your doctor about what you need help with and what your safety needs are.  Keep all follow-up visits as told by your doctor, including any therapy visits to help with talking or moving. This is important. Contact a doctor if:  Medicines do not help your symptoms.  You feel off-balance.  You fall at home.  You need more help at home.  You have trouble swallowing.  You have a very hard time pooping.  You have a lot of side effects from your medicines.  You feel very sad, worried, or confused. Get help right away if:  You were hurt in a fall.  You see or hear things that are not real.  You cannot swallow without  choking.  You have chest pain or trouble breathing.  You do not feel  safe at home.  You have thoughts about hurting yourself or others. If you ever feel like you may hurt yourself or others, or have thoughts about taking your own life, get help right away. You can go to your nearest emergency department or call:  Your local emergency services (911 in the [place]  A suicide crisis helpline, such as the [place] at (737)282-6252. This is open 24 hours a day. Summary  This condition causes problems with movements.  It is a long-term condition. It gets worse over time.  There is no cure. Treatment focuses on managing your symptoms.  Talk with your doctor about what you need help with and what your safety needs are.  Keep all follow-up visits as told by your doctor. This is important. This information is not intended to replace advice given to you by your health care provider. Make sure you discuss any questions you have with your health care provider. Document Revised: 09/27/2018 Document Reviewed: 09/27/2018 Elsevier Patient Education  [place].

## 2020-03-16 NOTE — Progress Notes (Addendum)
PATIENT: Hailey [name] DOB: [birth year]  REASON FOR VISIT: follow up HISTORY FROM: patient  Chief Complaint  Patient presents with  . Follow-up    3 month f/u, per [facility] patient has some good days and bad days.   . room 2    with relative      HISTORY OF PRESENT ILLNESS: Today 03/17/20 Hailey [name] is a 79 y.o. female here today for follow up for PD. Since last being seen, she has continued to have cognitive decline. She has good and bad days. She is having difficulty with hallucinations. She is mostly confused. She continues Sinemet 1 tablet three times daily. She does not know if she notes any benefit. PCP started Namenda in 12/2019. There has not been much benefit. She continues to have physical decline as well. She is considering radiation treatments but has not felt well enough. She is adamant that she does not want surgery. Oncology has been hesitant to consider surgery. She continues to live in nursing facility. She is mostly wheelchair bound. She does have good days where she will walk with nurse at facility but most days she only transfers from bed to wheelchair. She is now DNR.    HISTORY: (copied from my note on 12/16/2019)  Hailey [name] is a 79 y.o. female here today for follow up for PD. She continues to Sinemet TID. She has had some unfortunate medical problems since being seen.   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 She has had three ERCPs to try to unblock bile duct, last one about 2 weeks ago where two stents were placed. She is followed by Dr Burr Medico, oncology. They are presenting her case this week to determine next steps in plan. She is scheduled for a pet can on 12/25/2019.   She was hospitalized for 12 days on 11/07/2019. She has had significant decline in physical functioning since. She is no longer walking without assistance. She has had two falls. She is having more difficulty with her memory.  She was working with PT but this ended last week. She is now able to walk about 75-141ft with her walker and with assistance. She is now followed by Dr Inda Merlin, PCP in the nursing home.  She is requiring assistance with all ADL's.   HISTORY: (copied from my note on 05/14/2019)  Hailey [name] a 79 y.o.femalehere today for follow up for PD. She continues Sinemet three times dailyat 8am, 12pm and 4pm. She has some pain in right shoulder and right knee (s/p replacements of both joints). She is getting lumbar injections in her back that help with sciatica pain. She lives alone. She is able to do all ADLs independently. She uses a Corporate investment banker. No falls. She feels that memory is stable although it does take her longer to recall information. BP usually runs in 120's/60-70's at home. She has white coat syndrome. Her sister, brother-in-law and brother help her with needs around the home.She was referred to social work at [facility] with her last visit. She states that no one ever reached out to her. She is not interested in seeking new referral today.  HISTORY: (copied fromDr Ahern'snote on 09/05/2018)  Interval history 09/05/2018: She feels her tremors are improved at least the internal tremors. She is slowing down. She talks in her sleep but no acting out of dreams. She can try melatonin before bedtime. She is sedentary. Encouraged classes during the day, gave her  a list of PD specific classes and will send her to a Education officer, museum at [employer] to meet. Discussed exercise and decreased progression. No falls. No difficulty swallowing. Here with daughter who says she has tried to get patient active and moving and she won;t do it. Had Discussion, exercise is the best way to slow down progression. Called [name], will try to get her to see this PD specific social worker. Also provided information on PD of the [place], exercise classes for PD and support groups. Discussed assisted living and safety in the  home. Also make sure family has POA.   Interval history; DAT scan c/w Parkinsonian disorder. Discussed in detail with patient and husband. Discussed medication management. Recommended exercise classes. No falls in the last few weeks. Answered all questions. Discussed parkinson's disease, treatment, they had multiple questions about Sinemet. Make sure she gets skin checks to eval for skin malignancies. Discussed association between PD and melanoma.   Hailey [name] a 79 y.o.femalehere as a referral from Dr. Rudean Haskell a new problem Tremors. She was seen in the past in 2016 for dizziness which resolved an MRI of the brain was unremarkable. Here with sister in law. She feels tremulous. Brushing her teeth is difficult because her muscles dont want to work. Sister in law provides much information, everything is "slow motion", eating is slower, movements are slower overall, walking is slower but no falls. She started notciing symptoms more with BP medications a few years ago, symptoms are stable. She takes very low, speech has decreased in tone. Writing has changed, a little smaller and sloppy. No drooling or wet pillows. She says occasionally she stops when walking and has to get her legs going again and then her legs "unlock". No falls. No dizziness. No significant memory changes, age appropriate things per patient and sister-in-law. No Hallucinations or delusions. She has a resting tremor and action as well. No bvid dreams, but more trouble initiating sleep. She has toruble getting out of chairs but also has knee pain. Smell has worsened, decreased. She denies a resting tremor, her sister in law notices a resting tremor and action tremor. No difficulty with swallowing or choking on food.    Reviewed notes, labs and imaging from outside physicians, which showed:  Review primary care notes, patient has a left hand tremor but somewhat episodic sometimes at rest with inattention lasting 5-10  minutes. No falls. No swallowing difficulty some excess watering of the eyes. This is new, both at rest and with intention currently not symptomatic, also noted flat facies , slowed gait which may be attributable to arthritis and significant knee pain but no shuffling, no cogwheeling, evaluation for atypical Parkinson's.   Labs include see him he which showed BUN 21, creatinine 0.9 otherwise largely unremarkable, CBC unremarkable labs were drawn 12/26/2016.    REVIEW OF SYSTEMS: Out of a complete 14 system review of symptoms, the patient complains only of the following symptoms, hallucinations, confusion, weakness, immobility, and all other reviewed systems are negative.   ALLERGIES: Allergies  Allergen Reactions  . Merthiolate [Thimerosal] Other (See Comments)    Tincture Merthiolate-skin blistering  . Forteo [Parathyroid Hormone (Recomb)] Other (See Comments)    " strange feeling and I cannot describe how I felt but the Dr took me off of it"    HOME MEDICATIONS: Outpatient Medications Prior to Visit  Medication Sig Dispense Refill  . aspirin EC 81 MG tablet Take 81 mg by mouth daily.    . Calcium Carbonate-Vitamin D (  CALTRATE 600+D) 600-400 MG-UNIT per tablet Take 1 tablet by mouth daily.    . carbidopa-levodopa (SINEMET IR) 25-100 MG tablet TAKE 1 TABLET BY MOUTH THREE TIMES DAILY, TAKE 4 HOURS APART (Patient taking differently: Take 1 tablet by mouth 3 (three) times daily. 0800, 1200, 1600) 270 tablet 1  . diphenhydramine-acetaminophen (TYLENOL PM) 25-500 MG TABS tablet Take 1 tablet by mouth at bedtime as needed.    . furosemide (LASIX) 20 MG tablet Take 20 mg by mouth daily as needed.    Marland Kitchen guaiFENesin (MUCINEX) 600 MG 12 hr tablet Take 600 mg by mouth 2 (two) times daily as needed (congestion).    . melatonin 5 MG TABS Take 5 mg by mouth.    . memantine (NAMENDA) 10 MG tablet Take 10 mg by mouth 2 (two) times daily.    . metoprolol succinate (TOPROL-XL) 100 MG 24 hr tablet Take  100 mg by mouth daily. Take with or immediately following a meal.    . Polyethyl Glycol-Propyl Glycol (SYSTANE OP) Place 1 drop into both eyes daily as needed (dry eyes).    . polyethylene glycol (MIRALAX / GLYCOLAX) 17 g packet Take 17 g by mouth daily as needed.    . vitamin C (ASCORBIC ACID) 250 MG tablet Take 500 mg by mouth daily.    . Vitamin D, Ergocalciferol, (DRISDOL) 50000 units CAPS capsule Take 50,000 Units by mouth 2 (two) times a week. Tuesdays and Fridays     No facility-administered medications prior to visit.    PAST MEDICAL HISTORY: Past Medical History:  Diagnosis Date  . 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 Bilateral leg edema    takes lasix as needed for dependent edema   . H/O exercise stress test    4-5 yrs. ago, told that it was wnl , done at [place] office   . Headache    MH: Migraines  . Hypertension   . Neuromuscular disorder ([employer])   . Osteoporosis   . Parkinson disease ([place])   . Wears glasses     PAST SURGICAL HISTORY: Past Surgical History:  Procedure Laterality Date  . BILIARY BRUSHING  08/27/2019   Procedure: BILIARY BRUSHING;  Surgeon: Clarene Essex, MD;  Location: [facility];  Service: Endoscopy;;  . BILIARY DILATION  11/06/2019   Procedure: BILIARY DILATION;  Surgeon: Clarene Essex, MD;  Location: [facility];  Service: Endoscopy;;  . BILIARY STENT PLACEMENT N/A 11/06/2019   Procedure: BILIARY STENT PLACEMENT;  Surgeon: Clarene Essex, MD;  Location: [facility];  Service: Endoscopy;  Laterality: N/A;  . BIOPSY  08/27/2019   Procedure: BIOPSY;  Surgeon: Clarene Essex, MD;  Location: [facility];  Service: Endoscopy;;  . BREAST CYST EXCISION Left 1994   benign  . cataract surgery Bilateral 2007  . ERCP N/A 08/27/2019   Procedure: ENDOSCOPIC RETROGRADE CHOLANGIOPANCREATOGRAPHY (ERCP);  Surgeon: Clarene Essex, MD;  Location: [facility];  Service: Endoscopy;  Laterality: N/A;  balloon sweep  . ERCP N/A 10/15/2019   Procedure: ENDOSCOPIC RETROGRADE  CHOLANGIOPANCREATOGRAPHY (ERCP);  Surgeon: Clarene Essex, MD;  Location: [place];  Service: Endoscopy;  Laterality: N/A;  . ERCP N/A 11/06/2019   Procedure: ENDOSCOPIC RETROGRADE CHOLANGIOPANCREATOGRAPHY (ERCP);  Surgeon: Clarene Essex, MD;  Location: [facility];  Service: Endoscopy;  Laterality: N/A;  . EYE SURGERY     /w IOL  . FOOT TUMOR EXCISION Left 1992   benign  . IR BILIARY DRAIN PLACEMENT WITH CHOLANGIOGRAM  10/18/2019  . IR BILIARY DRAIN PLACEMENT WITH  CHOLANGIOGRAM  10/24/2019  . IR BILIARY DRAIN PLACEMENT WITH CHOLANGIOGRAM  11/07/2019  . IR BILIARY DRAIN PLACEMENT WITH CHOLANGIOGRAM  11/08/2019  . IR CHOLANGIOGRAM EXISTING TUBE  11/07/2019  . IR EXCHANGE BILIARY DRAIN  10/24/2019  . IR EXCHANGE BILIARY DRAIN  11/12/2019  . IR EXCHANGE BILIARY DRAIN  11/08/2019  . IR GUIDED DRAIN W CATHETER PLACEMENT  11/08/2019  . MOUTH SURGERY    . REVERSE SHOULDER ARTHROPLASTY Right 12/26/2013   Procedure: RIGHT SHOULDER REVERSE ARTHROPLASTY;  Surgeon: Marin Shutter, MD;  Location: [place];  Service: Orthopedics;  Laterality: Right;  . SPHINCTEROTOMY  10/15/2019   Procedure: SPHINCTEROTOMY;  Surgeon: Clarene Essex, MD;  Location: [facility];  Service: Endoscopy;;  . Bess Kinds CHOLANGIOSCOPY N/A 08/27/2019   Procedure: QJJHERDE CHOLANGIOSCOPY;  Surgeon: Clarene Essex, MD;  Location: [facility];  Service: Endoscopy;  Laterality: N/A;  . SPYGLASS CHOLANGIOSCOPY N/A 10/15/2019   Procedure: YCXKGYJE CHOLANGIOSCOPY;  Surgeon: Clarene Essex, MD;  Location: [place];  Service: Endoscopy;  Laterality: N/A;  . TOTAL KNEE ARTHROPLASTY Right 04/09/2018   Procedure: RIGHT TOTAL KNEE ARTHROPLASTY;  Surgeon: Gaynelle Arabian, MD;  Location: [facility];  Service: Orthopedics;  Laterality: Right;  Adductor Block    FAMILY HISTORY: Family History  Problem Relation Age of Onset  . Colon cancer Mother 57  . Heart Problems Mother   . Heart Problems Father   . Cancer Father        skin cancer  . Heart Problems Brother   .  Breast cancer Cousin   . Breast cancer Cousin   . Breast cancer Cousin   . Breast cancer Cousin   . Breast cancer Cousin     SOCIAL HISTORY: Social History   Socioeconomic History  . Marital status: Single    Spouse name: Not on file  . Number of children: 0  . Years of education: 37  . Highest education level: Not on file  Occupational History  . Not on file  Tobacco Use  . Smoking status: Former Smoker    Packs/day: 0.50    Years: 10.00    Pack years: 5.00    Types: Cigarettes    Quit date: 12/06/1968    Years since quitting: 51.3  . Smokeless tobacco: Never Used  . Tobacco comment: tried snuff once but "never could get it wet"  Vaping Use  . Vaping Use: Never used  Substance and Sexual Activity  . Alcohol use: No    Alcohol/week: 0.0 standard drinks  . Drug use: No  . Sexual activity: Not on file  Other Topics Concern  . Not on file  Social History Narrative   Lives at home by herself.   Right handed.   Caffeine use: 2 glasses tea/day    Social Determinants of Health   Financial Resource Strain:   . Difficulty of Paying Living Expenses: Not on file  Food Insecurity:   . Worried About Charity fundraiser in the Last Year: Not on file  . Ran Out of Food in the Last Year: Not on file  Transportation Needs:   . Lack of Transportation (Medical): Not on file  . Lack of Transportation (Non-Medical): Not on file  Physical Activity:   . Days of Exercise per Week: Not on file  . Minutes of Exercise per Session: Not on file  Stress:   . Feeling of Stress : Not on file  Social Connections:   . Frequency of Communication with Friends and Family: Not on file  .  Frequency of Social Gatherings with Friends and Family: Not on file  . Attends Religious Services: Not on file  . Active Member of Clubs or Organizations: Not on file  . Attends Archivist Meetings: Not on file  . Marital Status: Not on file  Intimate Partner Violence:   . Fear of Current or  Ex-Partner: Not on file  . Emotionally Abused: Not on file  . Physically Abused: Not on file  . Sexually Abused: Not on file      PHYSICAL EXAM  Vitals:   03/17/20 0952  BP: (!) 86/52  Pulse: 61   There is no height or weight on file to calculate BMI.  Generalized: Well developed, in no acute distress  Cardiology: normal rate and rhythm, no murmur noted Respiratory: clear to auscultation bilaterally  Neurological examination  Mentation: Drowsy, easy to arouse, she is not oriented to time, place, or history taking. Follows intermittent commands speech and language fluent Cranial nerve II-XII: Pupils were equal round reactive to light. Extraocular movements were full, unable to assess visual field as she can not follow commands. Facial sensation and strength were normal.  Motor: The motor testing reveals 4/5 strength bilaterally  Sensory: Sensory testing is intact to soft touch on all 4 extremities. No evidence of extinction is noted.  Coordination: unable to follow commands  Gait and station: not assessed, patient declines, in wheelchair, no gait belt or assistive devices   DIAGNOSTIC DATA (LABS, IMAGING, TESTING) - I reviewed patient records, labs, notes, testing and imaging myself where available.  No flowsheet data found.   Lab Results  Component Value Date   WBC 5.7 11/16/2019   HGB 10.8 (L) 11/16/2019   HCT 33.9 (L) 11/16/2019   MCV 99.7 11/16/2019   PLT 394 11/16/2019      Component Value Date/Time   NA 140 11/17/2019 0314   K 3.8 11/17/2019 0314   CL 110 11/17/2019 0314   CO2 24 11/17/2019 0314   GLUCOSE 91 11/17/2019 0314   BUN 7 (L) 11/17/2019 0314   CREATININE 0.70 11/17/2019 0314   CALCIUM 8.1 (L) 11/17/2019 0314   PROT 4.6 (L) 11/16/2019 0820   ALBUMIN 1.8 (L) 11/16/2019 0820   AST 37 11/16/2019 0820   ALT 29 11/16/2019 0820   ALKPHOS 143 (H) 11/16/2019 0820   BILITOT 3.9 (H) 11/16/2019 0820   GFRNONAA >60 11/17/2019 0314   GFRAA >60 11/17/2019  0314   No results found for: CHOL, HDL, LDLCALC, LDLDIRECT, TRIG, CHOLHDL No results found for: HGBA1C No results found for: VITAMINB12 No results found for: TSH     ASSESSMENT AND PLAN 79 y.o. year old female  has a past medical history of Arthritis, Bilateral leg edema, H/O exercise stress test, Headache, Hypertension, Neuromuscular disorder ([place]), Osteoporosis, Parkinson disease ([place]), and Wears glasses. here with     ICD-10-CM   1. 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 Cognitive decline  R41.89   4. Gait abnormality  R26.9     Turner continues to show cognitive and physical decline. She is reportedly more confused and continues to have difficulty with hallucinations. Fortunately, she does not seen frightened by hallucinations. She was started on Namenda but this has not helped much. She is now a DNR and continues to reside at [facility]. It is uncertain if she will participate in radiation treatments. I have had a discussion with her family. I do not feel change in Parkinson's  management with offer any benefit at this time. I have asked her sister to follow up closely with me for any changes in patient condition. May consider change in treatment plan should patient improve with radiation. Sister is in agreement. I have also suggested metoprolol dose be assessed. BP has been on the lower side with my visits and today is 86/52. Patient denies dizziness or syncopal symptoms, however, it is difficult to assess. She will follow up with me as needed. Family verbalizes understanding and agreement with plan.    No orders of the defined types were placed in this encounter.    No orders of the defined types were placed in this encounter.     I spent 30 minutes with the patient. 50% of this time was spent counseling and educating patient on plan of care and medications.    Debbora Presto, FNP-C 03/17/2020, 9:53 AM [facility] [address] [place], [place] [address] [phone]  Made any corrections needed, and agree with history, physical, neuro exam,assessment and plan as stated.     Sarina Ill, MD [facility]

## 2020-03-17 ENCOUNTER — Ambulatory Visit: Payer: Medicare Other | Admitting: Family Medicine

## 2020-03-17 ENCOUNTER — Encounter: Payer: Self-pay | Admitting: Family Medicine

## 2020-03-17 VITALS — BP 86/52 | HR 61

## 2020-03-17 DIAGNOSIS — G20 Parkinson's disease: Secondary | ICD-10-CM | POA: Diagnosis not present

## 2020-03-17 DIAGNOSIS — C221 Intrahepatic bile duct carcinoma: Secondary | ICD-10-CM

## 2020-03-17 DIAGNOSIS — R4189 Other symptoms and signs involving cognitive functions and awareness: Secondary | ICD-10-CM | POA: Diagnosis not present

## 2020-03-17 DIAGNOSIS — R269 Unspecified abnormalities of gait and mobility: Secondary | ICD-10-CM | POA: Diagnosis not present

## 2021-01-22 DEATH — deceased
